# Patient Record
Sex: Male | Born: 1975 | Race: Black or African American | Hispanic: No | Marital: Married | Smoking: Never smoker
Health system: Southern US, Community
[De-identification: ages and names within clinical notes are randomized; demographics above are authoritative.]

## PROBLEM LIST (undated history)

## (undated) DIAGNOSIS — J189 Pneumonia, unspecified organism: Secondary | ICD-10-CM

## (undated) DIAGNOSIS — D649 Anemia, unspecified: Secondary | ICD-10-CM

## (undated) DIAGNOSIS — T7840XA Allergy, unspecified, initial encounter: Secondary | ICD-10-CM

## (undated) DIAGNOSIS — J45909 Unspecified asthma, uncomplicated: Secondary | ICD-10-CM

## (undated) DIAGNOSIS — I10 Essential (primary) hypertension: Secondary | ICD-10-CM

## (undated) DIAGNOSIS — E119 Type 2 diabetes mellitus without complications: Secondary | ICD-10-CM

## (undated) DIAGNOSIS — F32A Depression, unspecified: Secondary | ICD-10-CM

## (undated) DIAGNOSIS — N189 Chronic kidney disease, unspecified: Secondary | ICD-10-CM

## (undated) HISTORY — PX: EYE SURGERY: SHX253

---

## 2007-08-08 ENCOUNTER — Ambulatory Visit: Payer: Self-pay | Admitting: Specialist

## 2018-08-20 DIAGNOSIS — I10 Essential (primary) hypertension: Secondary | ICD-10-CM | POA: Insufficient documentation

## 2018-08-20 DIAGNOSIS — F411 Generalized anxiety disorder: Secondary | ICD-10-CM | POA: Insufficient documentation

## 2018-08-20 DIAGNOSIS — I82401 Acute embolism and thrombosis of unspecified deep veins of right lower extremity: Secondary | ICD-10-CM | POA: Insufficient documentation

## 2018-08-20 DIAGNOSIS — E119 Type 2 diabetes mellitus without complications: Secondary | ICD-10-CM | POA: Insufficient documentation

## 2018-08-20 DIAGNOSIS — E1122 Type 2 diabetes mellitus with diabetic chronic kidney disease: Secondary | ICD-10-CM | POA: Insufficient documentation

## 2018-08-20 DIAGNOSIS — I82413 Acute embolism and thrombosis of femoral vein, bilateral: Secondary | ICD-10-CM | POA: Insufficient documentation

## 2018-11-21 DIAGNOSIS — Z86718 Personal history of other venous thrombosis and embolism: Secondary | ICD-10-CM | POA: Insufficient documentation

## 2018-11-21 DIAGNOSIS — E782 Mixed hyperlipidemia: Secondary | ICD-10-CM | POA: Insufficient documentation

## 2018-11-21 DIAGNOSIS — M545 Low back pain, unspecified: Secondary | ICD-10-CM | POA: Insufficient documentation

## 2020-04-28 ENCOUNTER — Other Ambulatory Visit: Payer: Self-pay

## 2020-04-28 ENCOUNTER — Ambulatory Visit (INDEPENDENT_AMBULATORY_CARE_PROVIDER_SITE_OTHER): Payer: BC Managed Care – PPO | Admitting: Vascular Surgery

## 2020-04-28 ENCOUNTER — Encounter (INDEPENDENT_AMBULATORY_CARE_PROVIDER_SITE_OTHER): Payer: Self-pay | Admitting: Vascular Surgery

## 2020-04-28 VITALS — BP 211/112 | HR 105 | Ht 75.0 in | Wt 233.0 lb

## 2020-04-28 DIAGNOSIS — E782 Mixed hyperlipidemia: Secondary | ICD-10-CM

## 2020-04-28 DIAGNOSIS — I10 Essential (primary) hypertension: Secondary | ICD-10-CM | POA: Diagnosis not present

## 2020-04-28 DIAGNOSIS — E119 Type 2 diabetes mellitus without complications: Secondary | ICD-10-CM

## 2020-04-28 DIAGNOSIS — I82413 Acute embolism and thrombosis of femoral vein, bilateral: Secondary | ICD-10-CM

## 2020-04-28 NOTE — Progress Notes (Signed)
MRN : [id number]  Bradley Lopez is a 45 y.o. ([birth year]) male who presents with chief complaint of  Chief Complaint  Patient presents with  . 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  History of Present Illness:   The patient presents to the office for evaluation of bilateral acute on chronic DVT.  His past history of DVT was identified remotely and the majority of his care has been at [place].  The initial symptoms were pain and swelling in the right lower extremity.  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  He notes that he has been maintained on Eliquis.   Unfortunately approximately 3 weeks ago he ran out of Eliquis and has not had it refilled.  In that interval he has had a tremendous increase in swelling which is now both legs instead of just the previously affected right leg.  He notes that at this point the pain is quite severe and he is having a profound time even trying to get in and out of his car.  The patient has not been using compression therapy at this point because he states that his legs are so swollen he cannot get them on.  No SOB or pleuritic chest pains.  No cough or hemoptysis.  No blood per rectum or blood in any sputum.  No excessive bruising per the patient.   Current Meds  Medication Sig  . Accu-Chek FastClix Lancets MISC by Percutaneous route.  Marland Kitchen apixaban (ELIQUIS) 2.5 MG TABS tablet Take by mouth.  . Cysteamine Bitartrate (PROCYSBI) 300 MG PACK Use 1 each once daily Use as instructed.  Marland Kitchen glucose blood (PRECISION QID TEST) test strip Use 1 each (1 strip total) once daily Use as instructed.  . hydrochlorothiazide (HYDRODIURIL) 25 MG tablet TK 1 T PO ONCE D  . metFORMIN (GLUCOPHAGE) 500 MG tablet Take by mouth.  . [DISCONTINUED] glucose blood (ACCU-CHEK GUIDE) test strip 1 strip by Other route daily.    No past medical history on file.    Social History Social  History   Tobacco Use  . Smoking status: Never Smoker  . Smokeless tobacco: Never Used    Family History No family history of bleeding/clotting disorders, porphyria or autoimmune disease   Allergies  Allergen Reactions  . Pine Hives and Itching     REVIEW OF SYSTEMS (Negative unless checked)  Constitutional: [] Weight loss  [] Fever  [] Chills Cardiac: [] Chest pain   [] Chest pressure   [] Palpitations   [] Shortness of breath when laying flat   [] Shortness of breath with exertion. Vascular:  [x] Pain in legs with walking   [x] Pain in legs at rest  [x] History of DVT   [] Phlebitis   [x] Swelling in legs   [] Varicose veins   [] Non-healing ulcers Pulmonary:   [] Uses home oxygen   [] Productive cough   [] Hemoptysis   [] Wheeze  [] COPD   [] Asthma Neurologic:  [] Dizziness   [] Seizures   [] History of stroke   [] History of TIA  [] Aphasia   [] Vissual changes   [] Weakness or numbness in arm   [] Weakness or numbness in leg Musculoskeletal:   [] Joint swelling   [] Joint pain   [] Low back pain Hematologic:  [] Easy bruising  [] Easy bleeding   [] Hypercoagulable state   [] Anemic Gastrointestinal:  [] Diarrhea   [] Vomiting  [] Gastroesophageal reflux/heartburn   [] Difficulty swallowing. Genitourinary:  [] Chronic kidney disease   [] Difficult urination  [] Frequent urination   []   Blood in urine Skin:  [] Rashes   [] Ulcers  Psychological:  [x] History of anxiety   []  History of major depression.  Physical Examination  Vitals:   04/28/20 1524  BP: (!) 211/112  Pulse: (!) 105  Weight: 233 lb (105.7 kg)  Height: 6\' 3"  (1.905 m)   Body mass index is 29.12 kg/m. Gen: WD/WN, NAD Head: [place]/AT, No temporalis wasting.  Ear/Nose/Throat: Hearing grossly intact, nares w/o erythema or drainage, poor dentition Eyes: PER, EOMI, sclera nonicteric.  Neck: Supple, no masses.  No bruit or JVD.  Pulmonary:  Good air movement, clear to auscultation bilaterally, no use of accessory muscles.  Cardiac: RRR, normal S1, S2, no  Murmurs. Vascular:   Mild venous stasis changes to the legs bilaterally.  4+ massive hard nylon pitting edema bilateral lower extremities Vessel Right Left  Radial Palpable Palpable  Gastrointestinal: soft, non-distended. No guarding/no peritoneal signs.  Musculoskeletal: M/S 5/5 throughout.  No deformity or atrophy.  Neurologic: CN 2-12 intact. Pain and light touch intact in extremities.  Symmetrical.  Speech is fluent. Motor exam as listed above. Psychiatric: Judgment intact, Mood & affect appropriate for pt's clinical situation. Dermatologic: Venous rashes no ulcers noted.  No changes consistent with cellulitis.  CBC No results found for: WBC, HGB, HCT, MCV, PLT  BMET No results found for: NA, K, CL, CO2, GLUCOSE, BUN, CREATININE, CALCIUM, GFRNONAA, GFRAA CrCl cannot be calculated (No successful lab value found.).  COAG No results found for: INR, PROTIME  Radiology No results found.   Assessment/Plan 1. DVT femoral (deep venous thrombosis) with thrombophlebitis, bilateral ([place]) I have had a long discussion with the patient regarding swelling and why it  causes symptoms.  Patient will begin wearing graduated compression stockings class 1 (20-30 mmHg) on a daily basis a prescription was given. The patient will  beginning wearing the stockings or wraps which ever he feels he is able to effectively don, first thing in the morning and removing them in the evening. The patient is instructed specifically not to sleep in the stockings.   In addition, behavioral modification will be initiated.  This will include frequent elevation, use of over the counter pain medications and exercise such as walking.  I will immediately restart his Eliquis and give him 2 weeks from our samples and will represcribe his Eliquis sending it directly to the pharmacy.  Consideration for a lymph pump will also be made based upon the effectiveness of therapy down the road.  This would help to improve the edema  control and prevent sequela such as ulcers and infections   Patient should undergo duplex ultrasound of the bilateral venous system ASAP to ensure that DVT is not present.  Given the magnitude of his debility he would be a candidate for intervention.  The patient will follow-up with me after the ultrasound.   - VAS [place] LOWER EXTREMITY VENOUS (DVT); Future  2. Essential hypertension Continue antihypertensive medications as already ordered, these medications have been reviewed and there are no changes at this time.   3. Type 2 diabetes mellitus without complication, without long-term current use of insulin (HCC) Continue hypoglycemic medications as already ordered, these medications have been reviewed and there are no changes at this time.  Hgb A1C to be monitored as already arranged by primary service   4. Mixed hyperlipidemia Continue statin as ordered and reviewed, no changes at this time     Hortencia Pilar, MD  04/28/2020 3:34 PM

## 2020-04-29 ENCOUNTER — Encounter (INDEPENDENT_AMBULATORY_CARE_PROVIDER_SITE_OTHER): Payer: Self-pay | Admitting: Vascular Surgery

## 2020-04-29 ENCOUNTER — Other Ambulatory Visit (INDEPENDENT_AMBULATORY_CARE_PROVIDER_SITE_OTHER): Payer: Self-pay | Admitting: Vascular Surgery

## 2020-04-29 DIAGNOSIS — M7989 Other specified soft tissue disorders: Secondary | ICD-10-CM

## 2020-04-29 DIAGNOSIS — I82501 Chronic embolism and thrombosis of unspecified deep veins of right lower extremity: Secondary | ICD-10-CM

## 2020-04-29 MED ORDER — APIXABAN 5 MG PO TABS: 5.0000 mg | ORAL_TABLET | Freq: Two times a day (BID) | ORAL | 11 refills | Status: DC

## 2020-05-01 NOTE — Progress Notes (Signed)
MRN : [id number]  Bradley Lopez is a 45 y.o. (1975-11-18) male who presents with chief complaint of No chief complaint on file. Marland Kitchen  History of Present Illness:     The patient presents to the office for evaluation of DVT.  DVT was identified at [facility] remotely.  The initial symptoms were pain and swelling in the right lower extremity.  He was initially seen in this office for a fairly rapid, abrupt onset of bilateral lower extremity swelling which is very painful.  The swelling extends up to his thighs even to the level of the groins.  It seemed to coincide with his running out of Eliquis and therefore not being on any anticoagulation therapy in light of his previous DVT.  The patient notes today that his legs continue to be very painful and remain exceedingly swollen.  Symptoms are slightly better with elevation.  The patient notes he has gained 5 pounds just since his last visit.  He is no longer able to wear regular pants because his thighs are so enlarged and is now wearing sweatpants  He was started back on Eliquis when he saw me last week and has been continuing this therapy.  No SOB or pleuritic chest pains.  No cough or hemoptysis.  No blood per rectum or blood in any sputum.  No excessive bruising per the patient.  Duplex ultrasound obtained today demonstrates chronic changes in the right popliteal area consistent with previous findings.  There does not appear to be any acute or subacute thrombus noted in the proximal femoral or iliac veins.  On the left there is no evidence of DVT.  Incidental finding of multiple enlarged lymph nodes bilateral groin areas.    No outpatient medications have been marked as taking for the 05/02/20 encounter (Appointment) with Delana Meyer, Dolores Lory, MD.    No past medical history on file. No family history of bleeding/clotting disorders, porphyria or autoimmune disease  No past surgical history on file.  Social History Social History   Tobacco Use  .  Smoking status: Never Smoker  . Smokeless tobacco: Never Used    Family History No family history on file.  Allergies  Allergen Reactions  . Pine Hives and Itching     REVIEW OF SYSTEMS (Negative unless checked)  Constitutional: [] Weight loss  [] Fever  [] Chills Cardiac: [] Chest pain   [] Chest pressure   [] Palpitations   [] Shortness of breath when laying flat   [] Shortness of breath with exertion. Vascular:  [] Pain in legs with walking   [x] Pain in legs at rest  [x] History of DVT   [] Phlebitis   [x] Swelling in legs   [] Varicose veins   [] Non-healing ulcers Pulmonary:   [] Uses home oxygen   [] Productive cough   [] Hemoptysis   [] Wheeze  [] COPD   [] Asthma Neurologic:  [] Dizziness   [] Seizures   [] History of stroke   [] History of TIA  [] Aphasia   [] Vissual changes   [] Weakness or numbness in arm   [] Weakness or numbness in leg Musculoskeletal:   [] Joint swelling   [] Joint pain   [] Low back pain Hematologic:  [] Easy bruising  [] Easy bleeding   [] Hypercoagulable state   [] Anemic Gastrointestinal:  [] Diarrhea   [] Vomiting  [] Gastroesophageal reflux/heartburn   [] Difficulty swallowing. Genitourinary:  [] Chronic kidney disease   [] Difficult urination  [] Frequent urination   [] Blood in urine Skin:  [] Rashes   [] Ulcers  Psychological:  [] History of anxiety   []  History of major depression.  Physical Examination  There were  no vitals filed for this visit. There is no height or weight on file to calculate BMI. Gen: WD/WN, NAD Head: /AT, No temporalis wasting.  Ear/Nose/Throat: Hearing grossly intact, nares w/o erythema or drainage Eyes: PER, EOMI, sclera nonicteric.  Neck: Supple, no large masses.   Pulmonary:  Good air movement, no audible wheezing bilaterally, no use of accessory muscles.  Cardiac: RRR, no JVD Vascular: Both legs are massively swollen up to the level of the groin the thighs are very firm the edema is minimally pitting Vessel Right Left  Radial Palpable Palpable   Gastrointestinal: Non-distended. No guarding/no peritoneal signs.  Musculoskeletal: M/S 5/5 throughout.  No deformity or atrophy.  Neurologic: CN 2-12 intact. Symmetrical.  Speech is fluent. Motor exam as listed above. Psychiatric: Judgment intact, Mood & affect appropriate for pt's clinical situation. Dermatologic: No rashes or ulcers noted.  No changes consistent with cellulitis.   CBC No results found for: WBC, HGB, HCT, MCV, PLT  BMET No results found for: NA, K, CL, CO2, GLUCOSE, BUN, CREATININE, CALCIUM, GFRNONAA, GFRAA CrCl cannot be calculated (No successful lab value found.).  COAG No results found for: INR, PROTIME  Radiology No results found.   Assessment/Plan 1. Pain and swelling of lower leg, unspecified laterality Given the findings of the duplex ultrasound my original preliminary diagnosis of iliofemoral DVT does not appear to be accurate.  There is still a possibility of a more proximal venous obstruction.  Given the finding of bilateral significant lymphadenopathy in addition to the above noted potential I will order a CT abdomen and pelvis with contrast to more thoroughly evaluate the venous system as well as better assess the lymphadenopathy.  In the meantime he will continue his Eliquis. - CT ANGIO ABDOMEN PELVIS  W &/OR WO CONTRAST; Future  2. History of DVT of lower extremity Continue Eliquis for now.  3. Type 2 diabetes mellitus without complication, without long-term current use of insulin ([place]) He has recently moved from the [facility] area back to [place].  I will help him establish with a primary care physician. - Ambulatory referral to Internal Medicine  4. Mixed hyperlipidemia See #3 - Ambulatory referral to Internal Medicine    Hortencia Pilar, MD  05/01/2020 8:34 PM

## 2020-05-02 ENCOUNTER — Ambulatory Visit (INDEPENDENT_AMBULATORY_CARE_PROVIDER_SITE_OTHER): Payer: BC Managed Care – PPO | Admitting: Vascular Surgery

## 2020-05-02 ENCOUNTER — Other Ambulatory Visit: Payer: Self-pay

## 2020-05-02 ENCOUNTER — Encounter (INDEPENDENT_AMBULATORY_CARE_PROVIDER_SITE_OTHER): Payer: Self-pay | Admitting: Vascular Surgery

## 2020-05-02 ENCOUNTER — Ambulatory Visit (INDEPENDENT_AMBULATORY_CARE_PROVIDER_SITE_OTHER): Payer: BC Managed Care – PPO

## 2020-05-02 VITALS — BP 194/106 | HR 98 | Resp 16 | Wt 238.6 lb

## 2020-05-02 DIAGNOSIS — E1169 Type 2 diabetes mellitus with other specified complication: Secondary | ICD-10-CM

## 2020-05-02 DIAGNOSIS — M7989 Other specified soft tissue disorders: Secondary | ICD-10-CM

## 2020-05-02 DIAGNOSIS — Z86718 Personal history of other venous thrombosis and embolism: Secondary | ICD-10-CM

## 2020-05-02 DIAGNOSIS — M79669 Pain in unspecified lower leg: Secondary | ICD-10-CM

## 2020-05-02 DIAGNOSIS — M79661 Pain in right lower leg: Secondary | ICD-10-CM | POA: Diagnosis not present

## 2020-05-02 DIAGNOSIS — E782 Mixed hyperlipidemia: Secondary | ICD-10-CM

## 2020-05-02 DIAGNOSIS — E119 Type 2 diabetes mellitus without complications: Secondary | ICD-10-CM | POA: Diagnosis not present

## 2020-05-02 DIAGNOSIS — M79662 Pain in left lower leg: Secondary | ICD-10-CM

## 2020-05-02 DIAGNOSIS — I82413 Acute embolism and thrombosis of femoral vein, bilateral: Secondary | ICD-10-CM | POA: Diagnosis not present

## 2020-05-02 DIAGNOSIS — I713 Abdominal aortic aneurysm, ruptured, unspecified: Secondary | ICD-10-CM

## 2020-05-04 ENCOUNTER — Ambulatory Visit: Admission: RE | Admit: 2020-05-04 | Payer: BC Managed Care – PPO | Source: Ambulatory Visit

## 2020-05-09 ENCOUNTER — Other Ambulatory Visit (INDEPENDENT_AMBULATORY_CARE_PROVIDER_SITE_OTHER): Payer: Self-pay | Admitting: Vascular Surgery

## 2020-05-09 ENCOUNTER — Other Ambulatory Visit: Payer: Self-pay

## 2020-05-09 ENCOUNTER — Ambulatory Visit
Admission: RE | Admit: 2020-05-09 | Discharge: 2020-05-09 | Disposition: A | Discharge status: Home / Self Care | Payer: BC Managed Care – PPO | Source: Ambulatory Visit | Attending: Vascular Surgery | Admitting: Vascular Surgery

## 2020-05-09 DIAGNOSIS — I713 Abdominal aortic aneurysm, ruptured, unspecified: Secondary | ICD-10-CM

## 2020-05-09 HISTORY — DX: Unspecified asthma, uncomplicated: J45.909

## 2020-05-09 HISTORY — DX: Essential (primary) hypertension: I10

## 2020-05-09 HISTORY — DX: Type 2 diabetes mellitus without complications: E11.9

## 2020-05-09 LAB — POCT I-STAT CREATININE: Creatinine, Ser: 1.9 mg/dL — ABNORMAL HIGH (ref 0.61–1.24)

## 2020-05-09 MED ORDER — IOHEXOL 350 MG/ML SOLN
75.0000 mL | Freq: Once | INTRAVENOUS | Status: AC | PRN
  Administered 2020-05-09: 75 mL via INTRAVENOUS

## 2020-05-12 ENCOUNTER — Ambulatory Visit (INDEPENDENT_AMBULATORY_CARE_PROVIDER_SITE_OTHER): Payer: BC Managed Care – PPO | Admitting: Vascular Surgery

## 2020-05-12 ENCOUNTER — Encounter (INDEPENDENT_AMBULATORY_CARE_PROVIDER_SITE_OTHER): Payer: Self-pay | Admitting: Vascular Surgery

## 2020-05-12 ENCOUNTER — Other Ambulatory Visit: Payer: Self-pay

## 2020-05-12 VITALS — BP 193/124 | HR 94 | Ht 75.0 in | Wt 225.0 lb

## 2020-05-12 DIAGNOSIS — I10 Essential (primary) hypertension: Secondary | ICD-10-CM

## 2020-05-12 DIAGNOSIS — I82413 Acute embolism and thrombosis of femoral vein, bilateral: Secondary | ICD-10-CM | POA: Diagnosis not present

## 2020-05-12 DIAGNOSIS — I890 Lymphedema, not elsewhere classified: Secondary | ICD-10-CM | POA: Diagnosis not present

## 2020-05-12 DIAGNOSIS — E119 Type 2 diabetes mellitus without complications: Secondary | ICD-10-CM

## 2020-05-12 DIAGNOSIS — E782 Mixed hyperlipidemia: Secondary | ICD-10-CM

## 2020-05-17 DIAGNOSIS — J45909 Unspecified asthma, uncomplicated: Secondary | ICD-10-CM | POA: Insufficient documentation

## 2020-05-18 ENCOUNTER — Other Ambulatory Visit: Payer: Self-pay | Admitting: Internal Medicine

## 2020-05-18 DIAGNOSIS — R748 Abnormal levels of other serum enzymes: Secondary | ICD-10-CM

## 2020-05-19 ENCOUNTER — Telehealth (INDEPENDENT_AMBULATORY_CARE_PROVIDER_SITE_OTHER): Payer: Self-pay | Admitting: Vascular Surgery

## 2020-05-19 ENCOUNTER — Encounter (INDEPENDENT_AMBULATORY_CARE_PROVIDER_SITE_OTHER): Payer: Self-pay

## 2020-05-19 ENCOUNTER — Other Ambulatory Visit (INDEPENDENT_AMBULATORY_CARE_PROVIDER_SITE_OTHER): Payer: Self-pay | Admitting: Nurse Practitioner

## 2020-05-19 NOTE — Telephone Encounter (Signed)
Patient needs RX Eliquis. Patient was last seen 05/12/20 with GS.

## 2020-05-19 NOTE — Telephone Encounter (Signed)
RX sent 04/29/20

## 2020-05-20 NOTE — Telephone Encounter (Signed)
Called patient 05/19/20 to make him aware. Patient stated he never called the pharmacy to check on the RX.

## 2020-05-23 ENCOUNTER — Other Ambulatory Visit (INDEPENDENT_AMBULATORY_CARE_PROVIDER_SITE_OTHER): Payer: Self-pay | Admitting: Nurse Practitioner

## 2020-05-23 NOTE — Telephone Encounter (Signed)
Patient prescription for Eliquis 5 mg has been called into [facility] per patient request and I have cancel rx that was sent to [facility]. Pharmacy has being updated in patient chart and patient has been made aware.

## 2020-05-24 ENCOUNTER — Other Ambulatory Visit (INDEPENDENT_AMBULATORY_CARE_PROVIDER_SITE_OTHER): Payer: Self-pay | Admitting: Nurse Practitioner

## 2020-05-24 MED ORDER — RIVAROXABAN 20 MG PO TABS: 20.0000 mg | ORAL_TABLET | Freq: Every day | ORAL | 11 refills | Status: DC

## 2020-05-26 ENCOUNTER — Other Ambulatory Visit: Payer: Self-pay

## 2020-05-26 ENCOUNTER — Ambulatory Visit
Admission: RE | Admit: 2020-05-26 | Discharge: 2020-05-26 | Disposition: A | Discharge status: Home / Self Care | Payer: BC Managed Care – PPO | Source: Ambulatory Visit | Attending: Internal Medicine | Admitting: Internal Medicine

## 2020-05-26 DIAGNOSIS — R748 Abnormal levels of other serum enzymes: Secondary | ICD-10-CM | POA: Diagnosis present

## 2020-05-28 ENCOUNTER — Encounter (INDEPENDENT_AMBULATORY_CARE_PROVIDER_SITE_OTHER): Payer: Self-pay | Admitting: Vascular Surgery

## 2020-05-28 DIAGNOSIS — I890 Lymphedema, not elsewhere classified: Secondary | ICD-10-CM | POA: Insufficient documentation

## 2020-05-28 NOTE — Progress Notes (Signed)
MRN : [id number]  Bradley Lopez is a 45 y.o. (Jul 17, 1975) male who presents with chief complaint of  Chief Complaint  Patient presents with  . Follow-up    CT resuts  .  History of Present Illness:   The patient returns to the office for followup evaluation regarding leg swelling.  The swelling has persisted and the pain associated with swelling continues. There have not been any interval development of a ulcerations or wounds.  Since the previous visit the patient has been wearing graduated compression stockings and has noted some improvement in the lymphedema. The patient has been using compression routinely morning until night.  He has also followed up with Dr Edwina Barth and has been taking his diuretic with some improvement.  The patient also states elevation during the day and exercise is being done too.   CT scan is negative fro diffuse lymphadenopathy not consistent with malignancy groin nodes appear more reactive.  Current Meds  Medication Sig  . Accu-Chek FastClix Lancets MISC by Percutaneous route.  . Cysteamine Bitartrate (PROCYSBI) 300 MG PACK Use 1 each once daily Use as instructed.  Marland Kitchen glucose blood (PRECISION QID TEST) test strip Use 1 each (1 strip total) once daily Use as instructed.  . hydrochlorothiazide (HYDRODIURIL) 25 MG tablet TK 1 T PO ONCE D  . metFORMIN (GLUCOPHAGE) 500 MG tablet Take by mouth.  . [DISCONTINUED] apixaban (ELIQUIS) 5 MG TABS tablet Take 1 tablet (5 mg total) by mouth 2 (two) times daily.    Past Medical History:  Diagnosis Date  . Asthma   . Diabetes mellitus without complication ([place])   . Hypertension     No past surgical history on file.  Social History Social History   Tobacco Use  . Smoking status: Never Smoker  . Smokeless tobacco: Never Used    Family History No family history on file.  Allergies  Allergen Reactions  . Pine Hives and Itching     REVIEW OF SYSTEMS (Negative unless checked)  Constitutional:  '[]'$ Weight loss  '[]'$ Fever  '[]'$ Chills Cardiac: '[]'$ Chest pain   '[]'$ Chest pressure   '[]'$ Palpitations   '[]'$ Shortness of breath when laying flat   '[]'$ Shortness of breath with exertion. Vascular:  '[]'$ Pain in legs with walking   '[x]'$ Pain in legs at rest  '[]'$ History of DVT   '[]'$ Phlebitis   '[x]'$ Swelling in legs   '[]'$ Varicose veins   '[]'$ Non-healing ulcers Pulmonary:   '[]'$ Uses home oxygen   '[]'$ Productive cough   '[]'$ Hemoptysis   '[]'$ Wheeze  '[]'$ COPD   '[]'$ Asthma Neurologic:  '[]'$ Dizziness   '[]'$ Seizures   '[]'$ History of stroke   '[]'$ History of TIA  '[]'$ Aphasia   '[]'$ Vissual changes   '[]'$ Weakness or numbness in arm   '[]'$ Weakness or numbness in leg Musculoskeletal:   '[]'$ Joint swelling   '[x]'$ Joint pain   '[]'$ Low back pain Hematologic:  '[]'$ Easy bruising  '[]'$ Easy bleeding   '[]'$ Hypercoagulable state   '[]'$ Anemic Gastrointestinal:  '[]'$ Diarrhea   '[]'$ Vomiting  '[]'$ Gastroesophageal reflux/heartburn   '[]'$ Difficulty swallowing. Genitourinary:  '[]'$ Chronic kidney disease   '[]'$ Difficult urination  '[]'$ Frequent urination   '[]'$ Blood in urine Skin:  '[]'$ Rashes   '[]'$ Ulcers  Psychological:  '[x]'$ History of anxiety   '[]'$  History of major depression.  Physical Examination  Vitals:   05/12/20 1624  BP: (!) 193/124  Pulse: 94  Weight: 225 lb (102.1 kg)  Height: '6\' 3"'$  (1.905 m)   Body mass index is 28.12 kg/m. Gen: WD/WN, NAD Head: [place]/AT, No temporalis wasting.  Ear/Nose/Throat: Hearing grossly intact, nares w/o erythema or drainage Eyes:  PER, EOMI, sclera nonicteric.  Neck: Supple, no large masses.   Pulmonary:  Good air movement, no audible wheezing bilaterally, no use of accessory muscles.  Cardiac: RRR, no JVD Vascular: scattered varicosities present bilaterally.  Moderate venous stasis changes to the legs bilaterally.  2-3+ soft pitting edema. Vessel Right Left  Radial Palpable Palpable  Gastrointestinal: Non-distended. No guarding/no peritoneal signs.  Musculoskeletal: M/S 5/5 throughout.  No deformity or atrophy.  Neurologic: CN 2-12 intact. Symmetrical.  Speech is fluent.  Motor exam as listed above. Psychiatric: Judgment intact, Mood & affect appropriate for pt's clinical situation. Dermatologic: Venous rashes no ulcers noted.  No changes consistent with cellulitis. Lymph : + lichenification / skin changes of chronic lymphedema.  CBC No results found for: WBC, HGB, HCT, MCV, PLT  BMET    Component Value Date/Time   CREATININE 1.90 (H) 05/09/2020 1102   Estimated Creatinine Clearance: 64.9 mL/min (A) (by C-G formula based on SCr of 1.9 mg/dL (H)).  COAG No results found for: INR, PROTIME  Radiology CT VENOGRAM ABD/PEL  Result Date: 05/09/2020 CLINICAL DATA:  Severe lymphedema of both lower extremities and history of prior right lower extremity DVT. Duplex ultrasound demonstrated prominent bilateral inguinal lymph nodes. EXAM: CT VENOGRAM ABD-PELVIS TECHNIQUE: Multidetector CT imaging of the abdomen and pelvis was performed using the standard protocol during bolus administration of intravenous contrast. Multiplanar reconstructed images and MIPs were obtained and reviewed to evaluate the vascular anatomy. CONTRAST:  55m OMNIPAQUE IOHEXOL 350 MG/ML SOLN COMPARISON:  Report from a bilateral lower extremity venous duplex ultrasound on 05/02/2020. FINDINGS: VASCULAR Veins: There is no evidence of thrombus in the IVC, iliac veins or common femoral veins. No mass effect on venous structures is identified and there is no evidence of visible venous stenosis. Evaluation of other venous structures in the abdomen and pelvis is somewhat limited due to the timing of the study and therefore evaluation of the portal vein and mesenteric veins is limited. Bilateral renal veins appear to be normally patent. Review of the MIP images confirms the above findings. NON-VASCULAR Lower chest: There are small bilateral pleural effusions. No visible parenchymal consolidation or pulmonary edema. Mild bibasilar pulmonary scarring. Hepatobiliary: No focal liver abnormality is seen. No  gallstones, gallbladder wall thickening, or biliary dilatation. Pancreas: Unremarkable. No pancreatic ductal dilatation or surrounding inflammatory changes. Spleen: Normal in size without focal abnormality. Adrenals/Urinary Tract: No hydronephrosis, focal renal lesions or calculi. The bladder is relatively decompressed. Stomach/Bowel: Bowel shows no evidence of obstruction, ileus or inflammation. No free air identified. Lymphatic: Mildly prominent bilateral inguinal lymph nodes. The largest on the right measures 13 mm in short axis and the largest on the left 14 mm. These may be reactive based on size and location. No evidence of enlarged abdominal or intrapelvic lymph nodes. Reproductive: Prostate is unremarkable. Calcifications in the seminal vesicles and vas deferens bilaterally likely relate to underlying diabetes. Other: No ascites or abscess. There is diffuse anasarca of the body wall which may reflect hyperproteinemia, renal dysfunction and/or cardiac dysfunction. Correlation suggested. No focal hernias. Musculoskeletal: No acute or significant osseous findings. IMPRESSION: 1. No evidence of thrombus in the IVC, iliac veins or common femoral veins. No mass effect on venous structures is identified and there is no evidence of visible venous stenosis. 2. Small bilateral pleural effusions. 3. Mildly prominent bilateral inguinal lymph nodes which may be reactive based on size and location. 4. Diffuse anasarca of the body wall which may reflect hyperproteinemia, renal dysfunction and/or cardiac dysfunction. Correlation  suggested. Electronically Signed   By: Aletta Edouard M.D.   On: 05/09/2020 13:00   VAS [place] LOWER EXTREMITY VENOUS (DVT)  Result Date: 05/05/2020  Lower Venous DVT Study Indications: Edema.  Limitations: Bilateral edema throughout lower extremities.  Comparison Study: Right leg Venous US on 08/24/19 at [facility]: "This right lower                   extremity venous duplex exam demonstrated chronic                    post-thrombotic changes in the mid to distal femoral and                   popliteal(s), gastrocnemius veins without significant deep                   venous reflux appreciated. Minimal change compared to the                   study of 02/11/19." Performing Technologist: Blondell Reveal RT, RDMS, RVT  Examination Guidelines: A complete evaluation includes B-mode imaging, spectral Doppler, color Doppler, and power Doppler as needed of all accessible portions of each vessel. Bilateral testing is considered an integral part of a complete examination. Limited examinations for reoccurring indications may be performed as noted. The reflux portion of the exam is performed with the patient in reverse Trendelenburg.  +---------+---------------+---------+-----------+---------------+--------------+ RIGHT    CompressibilityPhasicitySpontaneityProperties     Thrombus Aging +---------+---------------+---------+-----------+---------------+--------------+ CFV      Full           Yes      Yes                                      +---------+---------------+---------+-----------+---------------+--------------+ SFJ      Full                                                             +---------+---------------+---------+-----------+---------------+--------------+ FV Prox  Full                                                             +---------+---------------+---------+-----------+---------------+--------------+ FV Mid   Partial        Yes      Yes        softly         Chronic                                                    echogenic                     +---------+---------------+---------+-----------+---------------+--------------+ FV DistalPartial        Yes      Yes        softly         Chronic  echogenic                     +---------+---------------+---------+-----------+---------------+--------------+  POP      Partial        Yes      Yes        softly         Chronic                                                    echogenic                     +---------+---------------+---------+-----------+---------------+--------------+ PTV      Full                                                             +---------+---------------+---------+-----------+---------------+--------------+ PERO     Full                                                             +---------+---------------+---------+-----------+---------------+--------------+ Gastroc  Full                                                             +---------+---------------+---------+-----------+---------------+--------------+ GSV      Full           Yes      Yes                                      +---------+---------------+---------+-----------+---------------+--------------+ SSV      Full           Yes      Yes                                      +---------+---------------+---------+-----------+---------------+--------------+  +---------+---------------+---------+-----------+----------+--------------+ LEFT     CompressibilityPhasicitySpontaneityPropertiesThrombus Aging +---------+---------------+---------+-----------+----------+--------------+ CFV      Full           Yes      Yes                                 +---------+---------------+---------+-----------+----------+--------------+ SFJ      Full                                                        +---------+---------------+---------+-----------+----------+--------------+ FV Prox  Full                                                        +---------+---------------+---------+-----------+----------+--------------+  FV Mid   Full           Yes      Yes                                 +---------+---------------+---------+-----------+----------+--------------+ FV DistalFull                                                         +---------+---------------+---------+-----------+----------+--------------+ POP      Full           Yes      Yes                                 +---------+---------------+---------+-----------+----------+--------------+ PTV      Full                                                        +---------+---------------+---------+-----------+----------+--------------+ PERO     Full                                                        +---------+---------------+---------+-----------+----------+--------------+ Gastroc  Full                                                        +---------+---------------+---------+-----------+----------+--------------+ GSV      Full           Yes      Yes                                 +---------+---------------+---------+-----------+----------+--------------+ SSV      Full                                                        +---------+---------------+---------+-----------+----------+--------------+  Limited visualization of the bilateral mid-distal femoral and calf veins due to excessive bilateral edema.  Summary: RIGHT: - Findings consistent with chronic deep vein thrombosis involving the right femoral vein, and right popliteal vein. - Findings appear essentially unchanged compared to previous examination. Multiple enlarged lymph nodes noted in the groin area.  LEFT: - No evidence of deep vein thrombosis in the lower extremity. No indirect evidence of obstruction proximal to the inguinal ligament. Multiple enlarged lymph nodes noted in the groin area.  *See table(s) above for measurements and observations. Electronically signed by Hortencia Pilar MD on 05/05/2020 at 1:23:10 PM.    Final    US Abdomen Limited RUQ (LIVER/GB)  Result Date: 05/26/2020 CLINICAL DATA:  Elevated  liver enzymes EXAM: ULTRASOUND ABDOMEN LIMITED RIGHT UPPER QUADRANT COMPARISON:  CT venogram abdomen pelvis 05/09/2020 FINDINGS: Gallbladder: Minimal  sludge. No gallbladder thickening or pericholecystic fluid. No sonographic Murphy sign noted by sonographer. Common bile duct: Diameter: 5 mm Liver: No focal lesion identified. Within normal limits in parenchymal echogenicity. Portal vein is patent on color Doppler imaging with normal direction of blood flow towards the liver. Other: None. IMPRESSION: 1. Normal sonographic appearance of the liver. 2. Minimal gallbladder sludge without additional sonographic evidence of acute cholecystitis. 3. Incidental note made of a right pleural effusion. Electronically Signed   By: Miachel Roux M.D.   On: 05/26/2020 10:40     Assessment/Plan 1. Lymphedema Recommend:  No surgery or intervention at this point in time.    I have reviewed my previous discussion with the patient regarding swelling and why it causes symptoms.  Patient will continue wearing graduated compression stockings class 1 (20-30 mmHg) on a daily basis. The patient will  beginning wearing the stockings first thing in the morning and removing them in the evening. The patient is instructed specifically not to sleep in the stockings.    In addition, behavioral modification including several periods of elevation of the lower extremities during the day will be continued.  This was reviewed with the patient during the initial visit.  The patient will also continue routine exercise, especially walking on a daily basis as was discussed during the initial visit.    Despite conservative treatments including graduated compression therapy class 1 and behavioral modification including exercise and elevation the patient  has not obtained adequate control of the lymphedema.  The patient still has stage 3 lymphedema and therefore, I believe that a lymph pump should be added to improve the control of the patient's lymphedema.  Additionally, a lymph pump is warranted because it will reduce the risk of cellulitis and ulceration in the future.  Patient should  follow-up in six months    2. DVT femoral (deep venous thrombosis) with thrombophlebitis, bilateral (HCC) Recommend:  No surgery or intervention at this point in time.    I have reviewed my previous discussion with the patient regarding swelling and why it causes symptoms.  Patient will continue wearing graduated compression stockings class 1 (20-30 mmHg) on a daily basis. The patient will  beginning wearing the stockings first thing in the morning and removing them in the evening. The patient is instructed specifically not to sleep in the stockings.    In addition, behavioral modification including several periods of elevation of the lower extremities during the day will be continued.  This was reviewed with the patient during the initial visit.  The patient will also continue routine exercise, especially walking on a daily basis as was discussed during the initial visit.    Despite conservative treatments including graduated compression therapy class 1 and behavioral modification including exercise and elevation the patient  has not obtained adequate control of the lymphedema.  The patient still has stage 3 lymphedema and therefore, I believe that a lymph pump should be added to improve the control of the patient's lymphedema.  Additionally, a lymph pump is warranted because it will reduce the risk of cellulitis and ulceration in the future.  Patient should follow-up in six months    3. Essential hypertension Continue antihypertensive medications as already ordered, these medications have been reviewed and there are no changes at this time.   4. Type 2 diabetes mellitus without complication, without long-term current use of  insulin ([place]) Continue hypoglycemic medications as already ordered, these medications have been reviewed and there are no changes at this time.  Hgb A1C to be monitored as already arranged by primary service   5. Mixed hyperlipidemia Continue statin as ordered and  reviewed, no changes at this time     Hortencia Pilar, MD  05/28/2020 12:17 PM

## 2020-06-01 ENCOUNTER — Telehealth (INDEPENDENT_AMBULATORY_CARE_PROVIDER_SITE_OTHER): Payer: Self-pay

## 2020-06-01 NOTE — Telephone Encounter (Signed)
Lvm for patient to callback to be scheduled

## 2020-06-01 NOTE — Telephone Encounter (Signed)
Pt has already had ct results given to him. Ct done 05/09/20 ct results done 05/12/20 with GS

## 2020-06-01 NOTE — Telephone Encounter (Signed)
Pt needs a Follow up on his recent CT angio.

## 2020-06-06 ENCOUNTER — Ambulatory Visit (INDEPENDENT_AMBULATORY_CARE_PROVIDER_SITE_OTHER): Payer: BC Managed Care – PPO | Admitting: Vascular Surgery

## 2020-06-10 ENCOUNTER — Emergency Department: Payer: BC Managed Care – PPO

## 2020-06-10 ENCOUNTER — Inpatient Hospital Stay
Admission: EM | Admit: 2020-06-10 | Discharge: 2020-06-13 | DRG: 305 | Disposition: A | Discharge status: Home / Self Care | Payer: BC Managed Care – PPO | Attending: Internal Medicine | Admitting: Internal Medicine

## 2020-06-10 ENCOUNTER — Other Ambulatory Visit: Payer: Self-pay

## 2020-06-10 DIAGNOSIS — D631 Anemia in chronic kidney disease: Secondary | ICD-10-CM | POA: Diagnosis present

## 2020-06-10 DIAGNOSIS — E1165 Type 2 diabetes mellitus with hyperglycemia: Secondary | ICD-10-CM | POA: Diagnosis present

## 2020-06-10 DIAGNOSIS — Z86718 Personal history of other venous thrombosis and embolism: Secondary | ICD-10-CM

## 2020-06-10 DIAGNOSIS — I161 Hypertensive emergency: Principal | ICD-10-CM | POA: Diagnosis present

## 2020-06-10 DIAGNOSIS — N183 Chronic kidney disease, stage 3 unspecified: Secondary | ICD-10-CM | POA: Diagnosis present

## 2020-06-10 DIAGNOSIS — Z9119 Patient's noncompliance with other medical treatment and regimen: Secondary | ICD-10-CM

## 2020-06-10 DIAGNOSIS — I674 Hypertensive encephalopathy: Secondary | ICD-10-CM | POA: Diagnosis present

## 2020-06-10 DIAGNOSIS — E782 Mixed hyperlipidemia: Secondary | ICD-10-CM | POA: Diagnosis not present

## 2020-06-10 DIAGNOSIS — N5089 Other specified disorders of the male genital organs: Secondary | ICD-10-CM | POA: Diagnosis present

## 2020-06-10 DIAGNOSIS — E876 Hypokalemia: Secondary | ICD-10-CM | POA: Diagnosis present

## 2020-06-10 DIAGNOSIS — E1122 Type 2 diabetes mellitus with diabetic chronic kidney disease: Secondary | ICD-10-CM | POA: Diagnosis present

## 2020-06-10 DIAGNOSIS — Z7984 Long term (current) use of oral hypoglycemic drugs: Secondary | ICD-10-CM

## 2020-06-10 DIAGNOSIS — R601 Generalized edema: Secondary | ICD-10-CM | POA: Diagnosis present

## 2020-06-10 DIAGNOSIS — Z7901 Long term (current) use of anticoagulants: Secondary | ICD-10-CM

## 2020-06-10 DIAGNOSIS — H538 Other visual disturbances: Secondary | ICD-10-CM

## 2020-06-10 DIAGNOSIS — I129 Hypertensive chronic kidney disease with stage 1 through stage 4 chronic kidney disease, or unspecified chronic kidney disease: Secondary | ICD-10-CM | POA: Diagnosis present

## 2020-06-10 DIAGNOSIS — N049 Nephrotic syndrome with unspecified morphologic changes: Secondary | ICD-10-CM | POA: Diagnosis present

## 2020-06-10 DIAGNOSIS — R112 Nausea with vomiting, unspecified: Secondary | ICD-10-CM | POA: Diagnosis present

## 2020-06-10 DIAGNOSIS — E11319 Type 2 diabetes mellitus with unspecified diabetic retinopathy without macular edema: Secondary | ICD-10-CM | POA: Diagnosis present

## 2020-06-10 DIAGNOSIS — R778 Other specified abnormalities of plasma proteins: Secondary | ICD-10-CM | POA: Diagnosis present

## 2020-06-10 DIAGNOSIS — N50819 Testicular pain, unspecified: Secondary | ICD-10-CM

## 2020-06-10 DIAGNOSIS — E119 Type 2 diabetes mellitus without complications: Secondary | ICD-10-CM

## 2020-06-10 DIAGNOSIS — Z20822 Contact with and (suspected) exposure to covid-19: Secondary | ICD-10-CM | POA: Diagnosis present

## 2020-06-10 DIAGNOSIS — N1831 Chronic kidney disease, stage 3a: Secondary | ICD-10-CM | POA: Diagnosis present

## 2020-06-10 DIAGNOSIS — Z79899 Other long term (current) drug therapy: Secondary | ICD-10-CM

## 2020-06-10 DIAGNOSIS — I169 Hypertensive crisis, unspecified: Secondary | ICD-10-CM | POA: Diagnosis present

## 2020-06-10 DIAGNOSIS — N2581 Secondary hyperparathyroidism of renal origin: Secondary | ICD-10-CM | POA: Diagnosis present

## 2020-06-10 LAB — CBC WITH DIFFERENTIAL/PLATELET
Abs Immature Granulocytes: 0.03 10*3/uL (ref 0.00–0.07)
Basophils Absolute: 0.1 10*3/uL (ref 0.0–0.1)
Basophils Relative: 1 %
Eosinophils Absolute: 0.4 10*3/uL (ref 0.0–0.5)
Eosinophils Relative: 4 %
HCT: 30.9 % — ABNORMAL LOW (ref 39.0–52.0)
Hemoglobin: 10.2 g/dL — ABNORMAL LOW (ref 13.0–17.0)
Immature Granulocytes: 0 %
Lymphocytes Relative: 15 %
Lymphs Abs: 1.6 10*3/uL (ref 0.7–4.0)
MCH: 28.4 pg (ref 26.0–34.0)
MCHC: 33 g/dL (ref 30.0–36.0)
MCV: 86.1 fL (ref 80.0–100.0)
Monocytes Absolute: 0.8 10*3/uL (ref 0.1–1.0)
Monocytes Relative: 7 %
Neutro Abs: 7.8 10*3/uL — ABNORMAL HIGH (ref 1.7–7.7)
Neutrophils Relative %: 73 %
Platelets: 446 10*3/uL — ABNORMAL HIGH (ref 150–400)
RBC: 3.59 MIL/uL — ABNORMAL LOW (ref 4.22–5.81)
RDW: 13.4 % (ref 11.5–15.5)
WBC: 10.7 10*3/uL — ABNORMAL HIGH (ref 4.0–10.5)
nRBC: 0 % (ref 0.0–0.2)

## 2020-06-10 LAB — URINALYSIS, COMPLETE (UACMP) WITH MICROSCOPIC
Bilirubin Urine: NEGATIVE
Glucose, UA: 50 mg/dL — AB
Ketones, ur: NEGATIVE mg/dL
Nitrite: NEGATIVE
Protein, ur: >=300 mg/dL — AB
RBC / HPF: >50 RBC/hpf — ABNORMAL HIGH (ref 0–5)
Specific Gravity, Urine: 1.01 (ref 1.005–1.030)
WBC, UA: >50 WBC/hpf — ABNORMAL HIGH (ref 0–5)
pH: 7 (ref 5.0–8.0)

## 2020-06-10 LAB — COMPREHENSIVE METABOLIC PANEL
ALT: 30 U/L (ref 0–44)
AST: 48 U/L — ABNORMAL HIGH (ref 15–41)
Albumin: 2.3 g/dL — ABNORMAL LOW (ref 3.5–5.0)
Alkaline Phosphatase: 332 U/L — ABNORMAL HIGH (ref 38–126)
Anion gap: 8 (ref 5–15)
BUN: 19 mg/dL (ref 6–20)
CO2: 28 mmol/L (ref 22–32)
Calcium: 8.6 mg/dL — ABNORMAL LOW (ref 8.9–10.3)
Chloride: 105 mmol/L (ref 98–111)
Creatinine, Ser: 1.63 mg/dL — ABNORMAL HIGH (ref 0.61–1.24)
GFR, Estimated: 53 mL/min — ABNORMAL LOW (ref >60)
Glucose, Bld: 182 mg/dL — ABNORMAL HIGH (ref 70–99)
Potassium: 3.8 mmol/L (ref 3.5–5.1)
Sodium: 141 mmol/L (ref 135–145)
Total Bilirubin: 0.5 mg/dL (ref 0.3–1.2)
Total Protein: 6.3 g/dL — ABNORMAL LOW (ref 6.5–8.1)

## 2020-06-10 LAB — PROTEIN / CREATININE RATIO, URINE
Creatinine, Urine: 51 mg/dL
Protein Creatinine Ratio: 8.67 mg/mg{Cre} — ABNORMAL HIGH (ref 0.00–0.15)
Total Protein, Urine: 442 mg/dL

## 2020-06-10 LAB — RESP PANEL BY RT-PCR (FLU A&B, COVID) ARPGX2
Influenza A by PCR: NEGATIVE
Influenza B by PCR: NEGATIVE
SARS Coronavirus 2 by RT PCR: NEGATIVE

## 2020-06-10 LAB — URINE DRUG SCREEN, QUALITATIVE (ARMC ONLY)
Amphetamines, Ur Screen: NOT DETECTED
Barbiturates, Ur Screen: NOT DETECTED
Benzodiazepine, Ur Scrn: NOT DETECTED
Cannabinoid 50 Ng, Ur ~~LOC~~: NOT DETECTED
Cocaine Metabolite,Ur ~~LOC~~: NOT DETECTED
MDMA (Ecstasy)Ur Screen: NOT DETECTED
Methadone Scn, Ur: NOT DETECTED
Opiate, Ur Screen: NOT DETECTED
Phencyclidine (PCP) Ur S: NOT DETECTED
Tricyclic, Ur Screen: NOT DETECTED

## 2020-06-10 LAB — GLUCOSE, CAPILLARY: Glucose-Capillary: 158 mg/dL — ABNORMAL HIGH (ref 70–99)

## 2020-06-10 LAB — TROPONIN I (HIGH SENSITIVITY)
Troponin I (High Sensitivity): 36 ng/L — ABNORMAL HIGH (ref <18)
Troponin I (High Sensitivity): 39 ng/L — ABNORMAL HIGH (ref <18)

## 2020-06-10 LAB — BRAIN NATRIURETIC PEPTIDE: B Natriuretic Peptide: 922 pg/mL — ABNORMAL HIGH (ref 0.0–100.0)

## 2020-06-10 LAB — MAGNESIUM
Magnesium: 1.8 mg/dL (ref 1.7–2.4)
Magnesium: 1.5 mg/dL — ABNORMAL LOW (ref 1.7–2.4)

## 2020-06-10 LAB — CBG MONITORING, ED: Glucose-Capillary: 182 mg/dL — ABNORMAL HIGH (ref 70–99)

## 2020-06-10 LAB — LIPASE, BLOOD: Lipase: 19 U/L (ref 11–51)

## 2020-06-10 MED ORDER — INSULIN ASPART 100 UNIT/ML ~~LOC~~ SOLN
0.0000 [IU] | Freq: Three times a day (TID) | SUBCUTANEOUS | Status: DC
  Administered 2020-06-11: 2 [IU] via SUBCUTANEOUS
  Administered 2020-06-11 – 2020-06-12 (×4): 1 [IU] via SUBCUTANEOUS
  Administered 2020-06-13 (×2): 2 [IU] via SUBCUTANEOUS
  Filled 2020-06-10 (×7): qty 1

## 2020-06-10 MED ORDER — RIVAROXABAN 20 MG PO TABS
20.0000 mg | ORAL_TABLET | Freq: Every day | ORAL | Status: DC
  Administered 2020-06-10 – 2020-06-12 (×3): 20 mg via ORAL
  Filled 2020-06-10 (×3): qty 1

## 2020-06-10 MED ORDER — FUROSEMIDE 10 MG/ML IJ SOLN
40.0000 mg | Freq: Once | INTRAMUSCULAR | Status: AC
  Administered 2020-06-10: 40 mg via INTRAVENOUS
  Filled 2020-06-10: qty 4

## 2020-06-10 MED ORDER — LISINOPRIL 20 MG PO TABS
20.0000 mg | ORAL_TABLET | Freq: Every day | ORAL | Status: DC
Start: 2020-06-11 — End: 2020-06-13
  Administered 2020-06-11 – 2020-06-13 (×3): 20 mg via ORAL
  Filled 2020-06-10 (×3): qty 1

## 2020-06-10 MED ORDER — SODIUM CHLORIDE 0.9 % IV SOLN
INTRAVENOUS | Status: DC | PRN
  Administered 2020-06-10: 10 mL via INTRAVENOUS

## 2020-06-10 MED ORDER — FUROSEMIDE 10 MG/ML IJ SOLN
20.0000 mg | Freq: Once | INTRAMUSCULAR | Status: DC
  Filled 2020-06-10: qty 2

## 2020-06-10 MED ORDER — LORAZEPAM 2 MG/ML IJ SOLN
0.5000 mg | INTRAMUSCULAR | Status: DC | PRN
  Administered 2020-06-11: 0.5 mg via INTRAVENOUS
  Filled 2020-06-10: qty 1

## 2020-06-10 MED ORDER — MAGNESIUM SULFATE 2 GM/50ML IV SOLN
2.0000 g | Freq: Once | INTRAVENOUS | Status: AC
  Administered 2020-06-10: 2 g via INTRAVENOUS
  Filled 2020-06-10: qty 50

## 2020-06-10 MED ORDER — ONDANSETRON HCL 4 MG/2ML IJ SOLN
4.0000 mg | Freq: Once | INTRAMUSCULAR | Status: AC
  Administered 2020-06-10: 4 mg via INTRAVENOUS
  Filled 2020-06-10: qty 2

## 2020-06-10 MED ORDER — ATORVASTATIN CALCIUM 20 MG PO TABS
40.0000 mg | ORAL_TABLET | Freq: Every day | ORAL | Status: DC
  Administered 2020-06-10 – 2020-06-12 (×3): 40 mg via ORAL
  Filled 2020-06-10 (×3): qty 2

## 2020-06-10 MED ORDER — AMLODIPINE BESYLATE 5 MG PO TABS
5.0000 mg | ORAL_TABLET | Freq: Every day | ORAL | Status: DC
  Administered 2020-06-11 – 2020-06-13 (×3): 5 mg via ORAL
  Filled 2020-06-10 (×3): qty 1

## 2020-06-10 MED ORDER — FUROSEMIDE 40 MG PO TABS
40.0000 mg | ORAL_TABLET | Freq: Every day | ORAL | Status: DC
  Filled 2020-06-10: qty 1

## 2020-06-10 MED ORDER — FUROSEMIDE 10 MG/ML IJ SOLN
20.0000 mg | Freq: Two times a day (BID) | INTRAMUSCULAR | Status: DC
  Filled 2020-06-10: qty 2

## 2020-06-10 MED ORDER — LABETALOL HCL 5 MG/ML IV SOLN
20.0000 mg | Freq: Once | INTRAVENOUS | Status: AC
  Administered 2020-06-10: 20 mg via INTRAVENOUS
  Filled 2020-06-10: qty 4

## 2020-06-10 MED ORDER — LABETALOL HCL 5 MG/ML IV SOLN
10.0000 mg | INTRAVENOUS | Status: DC | PRN
  Administered 2020-06-10: 10 mg via INTRAVENOUS
  Filled 2020-06-10 (×2): qty 4

## 2020-06-10 MED ORDER — LABETALOL HCL 5 MG/ML IV SOLN
10.0000 mg | Freq: Once | INTRAVENOUS | Status: AC
  Administered 2020-06-10: 10 mg via INTRAVENOUS
  Filled 2020-06-10: qty 4

## 2020-06-10 NOTE — H&P (Signed)
History and Physical    PLEASE NOTE THAT DRAGON DICTATION SOFTWARE WAS USED IN THE CONSTRUCTION OF THIS NOTE.   Bradley Lopez [id number] DOB: 07-11-1975 DOA: 06/10/2020  PCP: Baxter Hire, MD Patient coming from: home   I have personally briefly reviewed patient's old medical records in [place]  Chief Complaint: Nausea/vomiting  HPI: Bradley Lopez is a 45 y.o. male with medical history significant for poorly controlled hypertension, type 2 diabetes mellitus, history of bilateral lower extremity DVT in 2021 chronically anticoagulated on Xarelto, stage IIIa chronic kidney disease with baseline creatinine 1.7-1.9, who is admitted to [facility] on 06/10/2020 with hypertensive emergency after presenting from home to [facility] ED complaining of nausea/vomiting.   The patient reports at approximately 2200 on 06/09/20 in his normal state of health before awaking at approximately 06/10/2020 with nausea resulting in 3-4 episodes of nonbloody, nonbilious emesis as well as new onset blurry vision in bilateral eyes.  He denies any associated acute focal weakness, acute focal numbness, paresthesias, vertigo, slurred speech, facial droop, dysphagia.  He also denies any associated abdominal pain, hematochezia, melena, or rash.  No recent trauma or travel.  Denies any recent subjective fever, chills, rigors, or generalized illness.  Denies any associated headache, neck stiffness rhinitis, rhinorrhea, sore throat, redness breath, wheezing, cough.  No recent known COVID-19 exposures.  Denies any recent dysuria, gross hematuria, or change in urinary urgency/frequency.  He also denies any associated chest pain, diaphoresis, palpitations, dizziness, presyncope, or syncope.  The patient conveys blood pressure is typically poorly controlled, with frequent systolic blood pressures in the high 190s mmHg when he has been checking over the last several months as an outpatient.  This is in the  context of his sole outpatient antihypertensive agent in the form of lisinopril 20 mg p.o. daily.  Medical history is also notable for history of stage IIIa chronic kidney disease with baseline creatinine range of 1.7-1.9.  The patient reports scheduled to establish with neurology on March 3.   He notes progressive amount of peripheral edema over the last 2 months.  Specifically reports progressive pitting edema in the bilateral lower extremities, with more recent development of scrotal swelling.  Prior to the last 2 months, the patient denies any history of prior peripheral edema.  In the setting of this new development of edema, patient's PCP initiated prescription of Lasix 40 mg p.o. daily a few weeks ago.  While the patient acknowledges good compliance with interval, he denies any significant improvement in his peripheral edema or scrotal swelling over that time.  Any known history of underlying liver failure, and denies any routine alcohol consumption.  Denies any known history of nephrotic syndrome.  No recent traveling.   The patient ports that he has previously developed bilateral diminished vision acuity when his blood sugar is running high.  He reports that the blurry vision with which he presents to the ED today is similar to that which she has previously experienced in the setting of suspected hyperglycemia.      ED Course:  Vital signs in the ED were notable for the following: Temperature max 97.7, heart rate 89-1 09, respiratory rate 15-20, oxygen saturation 98 to 100% on room air.  Initial blood pressure noted to be 252/139, with MAP of 177.  Subsequently, following labetalol 10 mg IV x1 as well as labetalol 20 mg IV x1, blood pressure was noted to decrease into the systolic low 123456 mmHg.   Labs were notable for  the following: CMP was notable for the following: Sodium 141, potassium 3.8, bicarbonate 28, creatinine 1.63 relative to most recent prior creatinine data point of 1.90 on  05/09/2020, glucose 182.  BNP found to be 922, with no prior BMP data points available in our EMR for point comparison.  CBC notable for white blood cell count of 10,700.  High-sensitivity troponin I initially noted to be 36, with repeat value trending up slightly to 39, with no prior data points available for point of comparison.  Urinalysis was notable for showing greater than 50 white blood cells, rare bacteria, positive hyaline casts, 300 protein, greater than 50 RBCs.  Additionally, screening nasopharyngeal COVID-19 PCR performed in the ED this evening was found to be negative.  EKG showed sinus tachycardia with heart rate 107, QTc 482, and no evidence of T wave or ST changes, including no evidence of ST elevation.  2 view chest x-ray showed mild bibasilar atelectasis with small pleural effusions.  Noncontrast CT that showed no evidence of acute cranial process, while MRI of the brain showed no evidence of acute intracranial process, including no evidence of acute ischemic infarct and no evidence of cerebral edema.  Ultrasound scrotum showed no evidence of testicular torsion or intratesticular mass, but showed evidence of scrotal wall edema.  While in the ED, the following were administered: In addition to the previously described 2 doses of IV labetalol, the patient also received Zofran 4 mg IV x1 while in the ED.  Subsequently, the patient was admitted to the PCU for further evaluation and management of suspected presenting hypertensive emergency.     Review of Systems: As per HPI otherwise 10 point review of systems negative.   Past Medical History:  Diagnosis Date  . Asthma   . Diabetes mellitus without complication ([place])   . Hypertension     History reviewed. No pertinent surgical history.  Social History:  reports that he has never smoked. He has never used smokeless tobacco. He reports previous alcohol use. He reports previous drug use.   Allergies  Allergen Reactions  . Pine  Hives and Itching    Family history reviewed and not pertinent    Prior to Admission medications   Medication Sig Start Date End Date Taking? Authorizing Provider  atorvastatin (LIPITOR) 40 MG tablet Take 40 mg by mouth at bedtime. 05/17/20  Yes [provider]  furosemide (LASIX) 40 MG tablet Take 40 mg by mouth daily. 06/07/20  Yes [provider]  glipiZIDE (GLUCOTROL XL) 2.5 MG 24 hr tablet Take 2.5 mg by mouth daily. 05/17/20  Yes [provider]  ibuprofen (ADVIL) 800 MG tablet Take 800 mg by mouth 3 (three) times daily as needed for moderate pain. 06/03/20  Yes [provider]  lisinopril (ZESTRIL) 20 MG tablet Take 20 mg by mouth daily. 05/17/20  Yes [provider]  rivaroxaban (XARELTO) 20 MG TABS tablet Take 1 tablet (20 mg total) by mouth daily with supper. 05/24/20  Yes Kris Hartmann, NP     Objective    Physical Exam: Vitals:   06/10/20 1044 06/10/20 1047 06/10/20 1049 06/10/20 1209  BP:   (!) 199/113 (!) 183/99  Pulse: (!) 103 95 94 94  Resp: '10 13 10 11  '$ Temp:      TempSrc:      SpO2: 96% 98% 99% 99%  Weight:      Height:        General: appears to be stated age; alert, oriented Skin:  warm, dry, no rash Head:  AT/Notus Mouth:  Oral mucosa membranes appear moist, normal dentition Neck: supple; trachea midline Heart:  RRR; did not appreciate any M/R/G Lungs: CTAB, did not appreciate any wheezes, rales, or rhonchi Abdomen: + BS; soft, ND, NT Vascular: 2+ pedal pulses b/l; 2+ radial pulses b/l Extremities: no peripheral edema, no muscle wasting Neuro: 5/5 strength of the proximal and distal flexors and extensors of the upper and lower extremities bilaterally; sensation intact in upper and lower extremities b/l; binocular diminished visual acuity. Otherwise, cranial nerves II through XII grossly intact; no pronator drift; no evidence suggestive of slurred speech, dysarthria, or facial droop; Normal muscle tone. No  tremors.    Labs on Admission: I have personally reviewed following labs and imaging studies  CBC: Recent Labs  Lab 06/10/20 1029  WBC 10.7*  NEUTROABS 7.8*  HGB 10.2*  HCT 30.9*  MCV 86.1  PLT 123XX123*   Basic Metabolic Panel: Recent Labs  Lab 06/10/20 1029  NA 141  K 3.8  CL 105  CO2 28  GLUCOSE 182*  BUN 19  CREATININE 1.63*  CALCIUM 8.6*  MG 1.8   GFR: Estimated Creatinine Clearance: 76.4 mL/min (A) (by C-G formula based on SCr of 1.63 mg/dL (H)). Liver Function Tests: Recent Labs  Lab 06/10/20 1029  AST 48*  ALT 30  ALKPHOS 332*  BILITOT 0.5  PROT 6.3*  ALBUMIN 2.3*   Recent Labs  Lab 06/10/20 1029  LIPASE 19   No results for input(s): AMMONIA in the last 168 hours. Coagulation Profile: No results for input(s): INR, PROTIME in the last 168 hours. Cardiac Enzymes: No results for input(s): CKTOTAL, CKMB, CKMBINDEX, TROPONINI in the last 168 hours. BNP (last 3 results) No results for input(s): PROBNP in the last 8760 hours. HbA1C: No results for input(s): HGBA1C in the last 72 hours. CBG: Recent Labs  Lab 06/10/20 1001  GLUCAP 182*   Lipid Profile: No results for input(s): CHOL, HDL, LDLCALC, TRIG, CHOLHDL, LDLDIRECT in the last 72 hours. Thyroid Function Tests: No results for input(s): TSH, T4TOTAL, FREET4, T3FREE, THYROIDAB in the last 72 hours. Anemia Panel: No results for input(s): VITAMINB12, FOLATE, FERRITIN, TIBC, IRON, RETICCTPCT in the last 72 hours. Urine analysis: No results found for: COLORURINE, APPEARANCEUR, LABSPEC, [place], GLUCOSEU, HGBUR, BILIRUBINUR, KETONESUR, PROTEINUR, UROBILINOGEN, NITRITE, LEUKOCYTESUR  Radiological Exams on Admission: DG Chest 2 View  Result Date: 06/10/2020 CLINICAL DATA:  Shortness of breath. EXAM: CHEST - 2 VIEW COMPARISON:  None. FINDINGS: The heart size and mediastinal contours are within normal limits. No pneumothorax is noted. Mild bibasilar atelectasis or edema is noted with small pleural  effusions. The visualized skeletal structures are unremarkable. IMPRESSION: Mild bibasilar atelectasis or edema is noted with small pleural effusions. Electronically Signed   By: Marijo Conception M.D.   On: 06/10/2020 11:24   US SCROTUM W/DOPPLER  Result Date: 06/10/2020 CLINICAL DATA:  Testicular pain and swelling EXAM: SCROTAL ULTRASOUND DOPPLER ULTRASOUND OF THE TESTICLES TECHNIQUE: Complete ultrasound examination of the testicles, epididymis, and other scrotal structures was performed. Color and spectral Doppler ultrasound were also utilized to evaluate blood flow to the testicles. COMPARISON:  None. FINDINGS: Right testicle Measurements: 4.4 x 2.9 x 2.9 cm. No mass is visualized. Scattered microlithiasis. Left testicle Measurements: 4.0 x 3.1 x 2.7 cm. No mass is visualized. Scattered microlithiasis. Right epididymis:  Normal in size and appearance. Left epididymis:  Normal in size and appearance. Hydrocele:  None visualized. Varicocele:  None visualized. Pulsed Doppler interrogation  of both testes demonstrates normal low resistance arterial and venous waveforms bilaterally. Other: Marked diffuse scrotal wall edema. No organized fluid collection identified. IMPRESSION: 1. Negative for testicular torsion or intratesticular mass. 2. Marked diffuse scrotal wall edema. Findings are nonspecific and may be secondary to anasarca, as was present on the recent CT. Cellulitis could have a similar appearance in the appropriate clinical setting. No organized fluid collection was identified. 3. Bilateral microlithiasis. Current literature suggests that testicular microlithiasis is not a significant independent risk factor for development of testicular carcinoma, and that follow up imaging is not warranted in the absence of other risk factors. Monthly testicular self-examination and annual physical exams are considered appropriate surveillance. If patient has other risk factors for testicular carcinoma, then referral to  Urology should be considered. (Reference: DeCastro, et al.: A 5-Year Follow up Study of Asymptomatic Men with Testicular Microlithiasis. J Urol 2008; [id number].) Electronically Signed   By: Davina Poke D.O.   On: 06/10/2020 12:22   CT HEAD CODE STROKE WO CONTRAST`  Result Date: 06/10/2020 CLINICAL DATA:  Code stroke. Neuro deficit, acute, stroke suspected. Additional history provided: Patient reports upper leg and groin swelling since November, blurred vision since 5 a.m. EXAM: CT HEAD WITHOUT CONTRAST TECHNIQUE: Contiguous axial images were obtained from the base of the skull through the vertex without intravenous contrast. COMPARISON:  No pertinent prior exams available for comparison. FINDINGS: Brain: Cerebral volume is normal. There is no acute intracranial hemorrhage. No demarcated cortical infarct. No extra-axial fluid collection. No evidence of intracranial mass. No midline shift. Vascular: No hyperdense vessel. Skull: Normal. Negative for fracture or focal lesion. Sinuses/Orbits: Visualized orbits show no acute finding. Trace scattered paranasal sinus mucosal thickening at the imaged levels. ASPECTS ([place] Stroke Program Early CT Score) - Ganglionic level infarction (caudate, lentiform nuclei, internal capsule, insula, M1-M3 cortex): 7 - Supraganglionic infarction (M4-M6 cortex): 3 Total score (0-10 with 10 being normal): 10 These results were called by telephone at the time of interpretation on 06/10/2020 at 11:05 am to provider [facility] , who verbally acknowledged these results. IMPRESSION: No evidence of acute intracranial abnormality.  ASPECTS is 10. Minimal paranasal sinus mucosal thickening at the imaged levels. Electronically Signed   By: Kellie Simmering DO   On: 06/10/2020 11:06     EKG: Independently reviewed, with result as described above.    Assessment/Plan   Zamarion Evert is a 45 y.o. male with medical history significant for poorly controlled hypertension, type 2  diabetes mellitus, history of bilateral lower extremity DVT in 2021 chronically anticoagulated on Xarelto, stage IIIa chronic kidney disease with baseline creatinine 1.7-1.9, who is admitted to [facility] on 06/10/2020 with hypertensive emergency after presenting from home to [facility] ED complaining of nausea/vomiting.     Principal Problem:   Hypertensive emergency Active Problems:   Mixed hyperlipidemia   Type 2 diabetes mellitus without complication, without long-term current use of insulin (HCC)   Nausea & vomiting   Anasarca   Elevated troponin   Hypomagnesemia   CKD (chronic kidney disease) stage 3, GFR 30-59 ml/min (HCC)     #) Hypertensive emergency: Diagnosis on the basis of presenting blood pressure 252/139 with MAP of 177 mmHg, with suspected evidence of associated endorgan damage in the form of concomitant presenting mild elevation in transaminases as well as, N/v, acutely diminished bilateral visual acuity, particularly given MRI brain showing no evidence of acute stroke and also given some degree of improvement in diminished visual acuity has blood  pressure begins to improve.  Following 2 doses of IV labetalol, systolic blood pressure has now decreased into the low 200s mmHg. of note, this is in the context of the patient's report of chronically poorly controlled pressure as an outpatient, with typical systolic blood pressures in the high 190s to low 200s mmHg in spite of good compliance with his lisinopril 20 mg p.o. daily.  Overall, presenting n/v and acute admission bilateral is felt to be less likely on the basis of acute ischemic cva.  Showing absolutely no evidence of acute ischemic infarct, suspect that the symptoms may be as a consequence of hypertensive emergency, particularly given gradual improvement with blood pressure slowly trending down, as further quantified below.  Of note, MRI of the brain also showed no evidence of cerebral edema, which was notable  given differential inclusion of PRES. additionally, high-grade stenosis of moderate or large vessel is felt to be less likely given absence of any findings of acute ischemic stroke as well as full improving nature of the patient's nausea/vomiting and Diminished visual acuity, as noted above.  Additionally thrombus in the moderate to large vasculature also appears to be less likely given the patient's reported good compliance with chronic anticoagulation via Xarelto.  However, will further evaluate with MRA brain and ultrasound of b/l carotids, with patient's renal function providing limitations to pursuit of CTA of the head and neck with contrast.  In terms of short-term blood pressure goals relating to this presentation, the goal of 10 to 20% reduction in systolic blood pressure over the first hour following presentation is been achieved.  Subsequent blood pressure goal includes a further reduction by 5 to 15% over the next 24 hours resulting in a total reduction in systolic blood pressure/map by 15 to 35% over that period, which would coincide with goal systolic blood pressure of between 164 - 214 mmHg.  Will exhibit caution with avoiding overly aggressive treatment of blood pressure assessment relative hypoperfusion consequences due to abrupt change in ensuing blood pressure.  Rather, I have ordered as needed IV labetalol for systolic blood pressure greater than 190 mmHg.  The patient would appear to benefit from optimization the efficacy of his outpatient hypertensive regimen beyond that offered by current lisinopril 20 mg p.o. daily.  As such, Norvasc 5 mg p.o. daily to start tomorrow morning, which will coincide with the conclusion of the 24-hour.  Following development of the above symptoms.  As current: Blood pressure in the setting of hypertensive emergency has been achieved, there does not appear to be a current indication for initiation of an antihypertensive drip.   Plan: Close monitoring of ensuing  blood pressures, with goal blood pressures as outlined above.  As needed IV labetalol for systolic blood pressures greater than 190 mmhg. monitor on telemetry.  Repeat CMP in the morning, with close attention for evaluation of endorgan damage.  Check TSH and UDS.  Monitor strict I's and O's.  Monitor on telemetry.  Initiation of Norvasc for morning, as above along with continuation of lisinopril at that time.  MRI brain.  Bilateral carotid ultrasound as further described above.  Serial overnight neurochecks.     #) Nausea/vomiting: 3-4 episodes of nonbloody, nonbilious emesis, which has improved following improving blood pressures with as needed dose of IV labetalol, as above.  Suspect that presenting nausea/vomiting was on the basis neurologic consequence from hypertensive emergency, as above.  Given presenting EKG suggestive of borderline QTC prolongation, I have ordered as needed IV Ativan as the  patient's current as needed antiemetic.  We will continue as needed IV Ativan.  Additional work-up and management of presenting suspected hypertensive emergency, as above.  Monitor on telemetry.  Evaluation and management of concomitant presenting hypomagnesemia, including plan for IV supplementation, as further described below.  Repeat CMP in the morning.      #) Anasarca: Patient reports last 2 months, with minimal if any improvement following recent initiation of Lasix 40 mg p.o. daily.  Underlying source currently unclear.  An outpatient echocardiogram has been ordered by the patient's PCP, although this imaging study has not yet occurred.  We will go ahead and order echocardiogram to be performed during this hospitalization.  No prior known history of underlying chronic heart failure.  Presenting chest x-ray shows mild bibasilar atelectasis with small pleural effusions, in the absence of overt evidence for pulmonary edema.  Presenting urinalysis showed greater than 300 protein as well as greater than 50  RBCs.  Consequently, differential also includes nephrotic syndrome, and will order routine to creatinine ratio to further evaluate this possibility.  We will also check to evaluate synthetic hepatic function, although the patient denies any known underlying history of liver disease.   Plan: Echocardiogram ordered for tomorrow morning, as above.  Monitor strict I's and O's and daily weights.  Ordered Lasix 40 mg IV twice daily for now.  Check INR, as above.  Check urine protein to creatinine ratio.  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  Repeat CMP in the morning.  Add on serum magnesium.  Would likely benefit from elevation of the bilateral lower extremities gravity to assist with improvement in distribution of edema.     #) Hypomagnesemia: Presenting serum magnesium level found to be 1.5.  Suspected ensuing diuresis efforts, will provide IV magnesium supplementation, as further outlined below.  Plan: Magnesium sulfate 2 g IV every 2 hours x1 dose now.  Repeat serum magnesium level in the morning.  Monitor on telemetry.      #) Type 2 diabetes mellitus: On glipizide a sole outpatient oral hypoglycemic agent.  Not on any exogenous insulin at home.  Presenting blood sugar, per presenting BMP noted to be 182.  Plan: We will hold home glipizide during his hospitalization.  Accu-Cheks before every meal and at bedtime with low-dose sliding scale insulin.  We will also check hemoglobin A1c.      #) Hyperlipidemia: On high intensity atorvastatin as an outpatient.  Plan: Continue home statin.      #) Stage IIIa chronic kidney disease: Associated with baseline creatinine range of 1.7-1.9, with patient reporting that he has upcoming scheduled establishment of care appointment with nephrology to occur on March 3.  Presenting urinalysis notable for the presence of greater than 300 protein as well as  greater than 50 RBCs, which warrants further evaluation, including for that of nephrotic syndrome, particularly given findings suggestive of anasarca, as above.  Of note, presenting creatinine found to be within his baseline range.    Plan: Monitor strict I's and O's and daily weights.  Tempt avoid nephrotoxic agents.  Add on urine protein to creatinine ratio, as above.  Repeat CMP in the morning, particularly for evaluation of any interval increase in Cr as consequent of  potential renal impacts of presenting hypertensive emergency. encourage the patient to maintain his establishment of care appointment with nephrology, as above.      #) Elevated troponin: mildly elevated initial troponin of  36, which is subsequently increased slightly to 39, with no prior high sensitivity troponin I value available for point of comparison.  Suspect that this mildly elevated troponin is on the basis of supply demand mismatch in the setting of endorgan damage as a result of presenting hypertensive emergency as opposed to representing a type I process due to acute plaque rupture.  EKG shows no evidence of acute ischemic changes, including no evidence of STEMI, while presentation is not associated with any chest pain.  Overall, ACS is felt to be less likely relative to type II supply demand mismatch, as above, but will closely monitor on telemetry overnight, while treating suspected underlying hypertensive emergency, as further described above.  Presentation is clinically less suggestive of acute pulmonary embolism, with this possibility further diminished in the setting of patient's reported good compliance with his chronic anticoagulation on Xarelto.   Plan: repeat troponin in the AM. Monitor on telemetry. PRN EKG for development of chest pain.  Repeat CMP in the morning as well as serum magnesium level at that time.  Additional evaluation management of presenting hypertensive emergency, as further described above.   Echocardiogram has been ordered in the morning, in order to evaluate progressive anasarca over the last 2 months, but will also additional benefit of evaluating for the presence of focal wall motion normalities given mildly elevated presenting troponin values.     DVT prophylaxis: Continue home Xarelto Code Status: Full code Family Communication: none Disposition Plan: Per Rounding Team Consults called: none  Admission status: Observation, PCU    Of note, this patient was added by me to the following Admit List/Treatment Team: armcad[facility].      PLEASE NOTE THAT DRAGON DICTATION SOFTWARE WAS USED IN THE CONSTRUCTION OF THIS NOTE.   [facility] Pager [phone] From 12PM - 12AM  Otherwise, please contact night-coverage  [email or web address] Password [email or web address]   06/10/2020, 1:16 PM

## 2020-06-10 NOTE — ED Provider Notes (Signed)
[facility] Emergency Department Provider Note   ____________________________________________   Event Date/Time   First MD Initiated Contact with Patient 06/10/20 1004     (approximate)  I have reviewed the triage vital signs and the nursing notes.   HISTORY  Chief Complaint Groin Swelling and Blurred Vision    HPI Bradley Lopez is a 45 y.o. male with past medical history of hypertension, diabetes, asthma, DVT on Xarelto, and lymphedema who presents to the ED for groin swelling and blurry vision.  Patient reports that he has been dealing with increasing swelling in both of his lower extremities as well as his groin for the past couple of months.  Swelling has become painful, especially in his groin, but he denies any redness, wounds, or drainage.  He has not had any difficulty urinating and denies any dysuria or hematuria.  He was recently started on Lasix by his PCP and swelling in his legs has improved, but swelling in his groin persists.  He then woke up this morning around 5 AM with blurry vision affecting both eyes.  He reports significant difficulty seeing but denies any speech changes, numbness, or weakness in his extremities.  He has started feeling nauseous with multiple episodes of vomiting.  He has been compliant with his blood pressure medications and denies any fever, cough, chest pain, or shortness of breath.        Past Medical History:  Diagnosis Date  . Asthma   . Diabetes mellitus without complication ([place])   . Hypertension     Patient Active Problem List   Diagnosis Date Noted  . Lymphedema 05/28/2020  . Pain and swelling of lower leg 05/02/2020  . Acute right-sided low back pain without sciatica 11/21/2018  . History of DVT of lower extremity 11/21/2018  . Mixed hyperlipidemia 11/21/2018  . DVT femoral (deep venous thrombosis) with thrombophlebitis, bilateral ([place]) 08/20/2018  . Essential hypertension 08/20/2018  . Generalized  anxiety disorder 08/20/2018  . Type 2 diabetes mellitus without complication, without long-term current use of insulin ([place]) 08/20/2018    History reviewed. No pertinent surgical history.  Prior to Admission medications   Medication Sig Start Date End Date Taking? Authorizing Provider  atorvastatin (LIPITOR) 40 MG tablet Take 40 mg by mouth at bedtime. 05/17/20  Yes [provider]  furosemide (LASIX) 40 MG tablet Take 40 mg by mouth daily. 06/07/20  Yes [provider]  glipiZIDE (GLUCOTROL XL) 2.5 MG 24 hr tablet Take 2.5 mg by mouth daily. 05/17/20  Yes [provider]  ibuprofen (ADVIL) 800 MG tablet Take 800 mg by mouth 3 (three) times daily as needed for moderate pain. 06/03/20  Yes [provider]  lisinopril (ZESTRIL) 20 MG tablet Take 20 mg by mouth daily. 05/17/20  Yes [provider]  rivaroxaban (XARELTO) 20 MG TABS tablet Take 1 tablet (20 mg total) by mouth daily with supper. 05/24/20  Yes Kris Hartmann, NP    Allergies Pine  No family history on file.  Social History Social History   Tobacco Use  . Smoking status: Never Smoker  . Smokeless tobacco: Never Used  Substance Use Topics  . Alcohol use: Not Currently  . Drug use: Not Currently    Review of Systems  Constitutional: No fever/chills Eyes: Positive for blurry vision. ENT: No sore throat. Cardiovascular: Denies chest pain. Respiratory: Denies shortness of breath. Gastrointestinal: No abdominal pain.  Positive for vomiting.  No diarrhea.  No constipation. Genitourinary: Negative for dysuria.  Positive for groin swelling and pain. Musculoskeletal: Negative for back pain. Skin: Negative for rash. Neurological: Negative for headaches, focal weakness or numbness.  ____________________________________________   PHYSICAL EXAM:  VITAL SIGNS: ED Triage Vitals  Enc Vitals Group     BP 06/10/20 1000 (!) 212/125     Pulse Rate 06/10/20 1000 (!) 104     Resp  06/10/20 1000 20     Temp 06/10/20 1000 97.7 F (36.5 C)     Temp Source 06/10/20 1000 Oral     SpO2 --      Weight 06/10/20 1000 230 lb (104.3 kg)     Height 06/10/20 1000 '6\' 3"'$  (1.905 m)     Head Circumference --      Peak Flow --      Pain Score 06/10/20 1000 7     Pain Loc --      Pain Edu? --      Excl. in [place]? --     Constitutional: Alert and oriented. Eyes: Conjunctivae are normal.  Pupils equal round reactive to light bilaterally. Head: Atraumatic. Nose: No congestion/rhinnorhea. Mouth/Throat: Mucous membranes are moist. Neck: Normal ROM Cardiovascular: Tachycardic, regular rhythm. Grossly normal heart sounds. Respiratory: Normal respiratory effort.  No retractions. Lungs CTAB. Gastrointestinal: Soft and diffusely tender to palpation with no rebound or guarding. No distention. Genitourinary: Significantly edematous scrotum and penis with no erythema, warmth, or tenderness. No skin lesions noted. Musculoskeletal: No lower extremity tenderness, 2+ pitting edema to mid thighs bilaterally. Neurologic:  Normal speech and language. No gross focal neurologic deficits are appreciated. Skin:  Skin is warm, dry and intact. No rash noted. Psychiatric: Mood and affect are normal. Speech and behavior are normal.  ____________________________________________   LABS (all labs ordered are listed, but only abnormal results are displayed)  Labs Reviewed  CBC WITH DIFFERENTIAL/PLATELET - Abnormal; Notable for the following components:      Result Value   WBC 10.7 (*)    RBC 3.59 (*)    Hemoglobin 10.2 (*)    HCT 30.9 (*)    Platelets 446 (*)    Neutro Abs 7.8 (*)    All other components within normal limits  COMPREHENSIVE METABOLIC PANEL - Abnormal; Notable for the following components:   Glucose, Bld 182 (*)    Creatinine, Ser 1.63 (*)    Calcium 8.6 (*)    Total Protein 6.3 (*)    Albumin 2.3 (*)    AST 48 (*)    Alkaline Phosphatase 332 (*)    GFR, Estimated 53 (*)    All  other components within normal limits  BRAIN NATRIURETIC PEPTIDE - Abnormal; Notable for the following components:   B Natriuretic Peptide 922.0 (*)    All other components within normal limits  CBG MONITORING, ED - Abnormal; Notable for the following components:   Glucose-Capillary 182 (*)    All other components within normal limits  TROPONIN I (HIGH SENSITIVITY) - Abnormal; Notable for the following components:   Troponin I (High Sensitivity) 36 (*)    All other components within normal limits  TROPONIN I (HIGH SENSITIVITY) - Abnormal; Notable for the following components:   Troponin I (High Sensitivity) 39 (*)    All other components within normal limits  RESP PANEL BY RT-PCR (FLU A&B, COVID) ARPGX2  LIPASE, BLOOD  MAGNESIUM  URINALYSIS, COMPLETE (UACMP) WITH MICROSCOPIC   ____________________________________________  EKG  ED ECG REPORT I, Blake Divine, the attending physician, personally viewed and interpreted this ECG.  Date: 06/10/2020  EKG Time: 10:27  Rate: 107  Rhythm: sinus tachycardia  Axis: Normal  Intervals:Borderline prolonged QT  ST&T Change: None   PROCEDURES  Procedure(s) performed (including Critical Care):  .Critical Care Performed by: Blake Divine, MD Authorized by: Blake Divine, MD   Critical care provider statement:    Critical care time (minutes):  45   Critical care time was exclusive of:  Separately billable procedures and treating other patients and teaching time   Critical care was necessary to treat or prevent imminent or life-threatening deterioration of the following conditions:  CNS failure or compromise   Critical care was time spent personally by me on the following activities:  Discussions with consultants, evaluation of patient's response to treatment, examination of patient, ordering and performing treatments and interventions, ordering and review of laboratory studies, ordering and review of radiographic studies, pulse  oximetry, re-evaluation of patient's condition, obtaining history from patient or surrogate and review of old charts   I assumed direction of critical care for this patient from another provider in my specialty: no       ____________________________________________   INITIAL IMPRESSION / ASSESSMENT AND PLAN / ED COURSE       45 year old male with past medical history of hypertension, diabetes, asthma, DVT on Xarelto, and lymphedema who presents to the ED with worsening groin swelling and pain for the past couple of months accompanied with acute onset blurry vision with nausea and vomiting starting this morning around 5 AM.  Given bilateral blurry vision, stroke seems less likely, and patient would be outside the window for TPA given onset about 5 hours prior to arrival.  I doubt large vessel occlusion and there is no indication for code stroke at this time, I would be more suspicious for PRES given patient's marked hypertension.  We will treat with IV labetalol and reassess, he may end up requiring Cardene drip.  We will also check CT head as well as scrotal ultrasound, although no evidence of scrotal infection apparent on exam.  CT head reviewed by me and shows no obvious hemorrhage, negative for acute process per radiology. Chest x-ray reviewed by me and shows possible pulmonary edema but no focal infiltrates. Patient's blood pressure gradually improving following initial dose of labetalol, we will give follow-up dose as well as Lasix for his peripheral edema. The goal will be to reduce his MAP by 20% given concern for hypertensive encephalopathy. Patient reports some improvement in his blurry vision, however it has not resolved. We will further assess with MRI. Labs remarkable for elevated BNP as well as creatinine and I am concerned for new onset CHF. He would benefit from echocardiogram and further cardiac work-up during admission as well. Given improving blood pressure, I do not feel he requires  Cardene drip at this time and case discussed with hospitalist for admission.      ____________________________________________   FINAL CLINICAL IMPRESSION(S) / ED DIAGNOSES  Final diagnoses:  Hypertensive encephalopathy syndrome  Scrotal edema     ED Discharge Orders    None       Note:  This document was prepared using Dragon voice recognition software and may include unintentional dictation errors.   Blake Divine, MD 06/10/20 1314

## 2020-06-10 NOTE — ED Triage Notes (Signed)
Pt c/o upper leg and groin swelling since November and has been seeing dr Edwina Barth for that and put on lasix, pt c/o blurred vision since 5am when he woke up, ok last night.. pt states he thinks his sugar is running high.

## 2020-06-10 NOTE — ED Notes (Signed)
Pt remains in MRI at this time  

## 2020-06-10 NOTE — Progress Notes (Signed)
   06/10/20 1500  Assess: MEWS Score  Temp 98.2 F (36.8 C)  BP (!) 206/120  Pulse Rate 95  Resp 20  Level of Consciousness Alert  SpO2 100 %  O2 Device Room Air  Assess: MEWS Score  MEWS Temp 0  MEWS Systolic 2  MEWS Pulse 0  MEWS RR 0  MEWS LOC 0  MEWS Score 2  MEWS Score Color Yellow  Assess: if the MEWS score is Yellow or Red  Were vital signs taken at a resting state? Yes  Focused Assessment No change from prior assessment  Early Detection of Sepsis Score *See Row Information* Low  MEWS guidelines implemented *See Row Information* Yes  Treat  MEWS Interventions Escalated (See documentation below)  Pain Scale 0-10  Pain Score 0  Take Vital Signs  Increase Vital Sign Frequency  Yellow: Q 2hr X 2 then Q 4hr X 2, if remains yellow, continue Q 4hrs  Escalate  MEWS: Escalate Yellow: discuss with charge nurse/RN and consider discussing with provider and RRT  Notify: Charge Nurse/RN  Name of Charge Nurse/RN Notified stephanie summers  Date Charge Nurse/RN Notified 06/10/20  Time Charge Nurse/RN Notified 1500   Patient MEWS yellow due to systolic BP. Patient given PRN labetalol See MAR and flowsheet for reassessment of vitals. Patient c/o blurred vision, no change from prior assessment as per patient.

## 2020-06-11 ENCOUNTER — Observation Stay
Admit: 2020-06-11 | Discharge: 2020-06-11 | Disposition: A | Discharge status: Home / Self Care | Payer: BC Managed Care – PPO | Attending: Internal Medicine | Admitting: Internal Medicine

## 2020-06-11 ENCOUNTER — Observation Stay: Payer: BC Managed Care – PPO

## 2020-06-11 DIAGNOSIS — Z86718 Personal history of other venous thrombosis and embolism: Secondary | ICD-10-CM | POA: Diagnosis not present

## 2020-06-11 DIAGNOSIS — I82413 Acute embolism and thrombosis of femoral vein, bilateral: Secondary | ICD-10-CM | POA: Diagnosis not present

## 2020-06-11 DIAGNOSIS — R7989 Other specified abnormal findings of blood chemistry: Secondary | ICD-10-CM | POA: Diagnosis present

## 2020-06-11 DIAGNOSIS — Z20822 Contact with and (suspected) exposure to covid-19: Secondary | ICD-10-CM | POA: Diagnosis present

## 2020-06-11 DIAGNOSIS — R778 Other specified abnormalities of plasma proteins: Secondary | ICD-10-CM | POA: Diagnosis present

## 2020-06-11 DIAGNOSIS — N1831 Chronic kidney disease, stage 3a: Secondary | ICD-10-CM | POA: Diagnosis present

## 2020-06-11 DIAGNOSIS — I129 Hypertensive chronic kidney disease with stage 1 through stage 4 chronic kidney disease, or unspecified chronic kidney disease: Secondary | ICD-10-CM | POA: Diagnosis present

## 2020-06-11 DIAGNOSIS — R601 Generalized edema: Secondary | ICD-10-CM | POA: Diagnosis present

## 2020-06-11 DIAGNOSIS — Z79899 Other long term (current) drug therapy: Secondary | ICD-10-CM | POA: Diagnosis not present

## 2020-06-11 DIAGNOSIS — I161 Hypertensive emergency: Secondary | ICD-10-CM | POA: Diagnosis present

## 2020-06-11 DIAGNOSIS — E876 Hypokalemia: Secondary | ICD-10-CM | POA: Diagnosis present

## 2020-06-11 DIAGNOSIS — H538 Other visual disturbances: Secondary | ICD-10-CM | POA: Diagnosis not present

## 2020-06-11 DIAGNOSIS — E782 Mixed hyperlipidemia: Secondary | ICD-10-CM | POA: Diagnosis present

## 2020-06-11 DIAGNOSIS — D631 Anemia in chronic kidney disease: Secondary | ICD-10-CM | POA: Diagnosis present

## 2020-06-11 DIAGNOSIS — N170 Acute kidney failure with tubular necrosis: Secondary | ICD-10-CM | POA: Diagnosis not present

## 2020-06-11 DIAGNOSIS — Z9119 Patient's noncompliance with other medical treatment and regimen: Secondary | ICD-10-CM | POA: Diagnosis not present

## 2020-06-11 DIAGNOSIS — E1165 Type 2 diabetes mellitus with hyperglycemia: Secondary | ICD-10-CM | POA: Diagnosis present

## 2020-06-11 DIAGNOSIS — E1329 Other specified diabetes mellitus with other diabetic kidney complication: Secondary | ICD-10-CM | POA: Diagnosis not present

## 2020-06-11 DIAGNOSIS — Z7901 Long term (current) use of anticoagulants: Secondary | ICD-10-CM | POA: Diagnosis not present

## 2020-06-11 DIAGNOSIS — N049 Nephrotic syndrome with unspecified morphologic changes: Secondary | ICD-10-CM | POA: Diagnosis present

## 2020-06-11 DIAGNOSIS — E1122 Type 2 diabetes mellitus with diabetic chronic kidney disease: Secondary | ICD-10-CM | POA: Diagnosis present

## 2020-06-11 DIAGNOSIS — I674 Hypertensive encephalopathy: Secondary | ICD-10-CM | POA: Diagnosis present

## 2020-06-11 DIAGNOSIS — E1121 Type 2 diabetes mellitus with diabetic nephropathy: Secondary | ICD-10-CM | POA: Diagnosis not present

## 2020-06-11 DIAGNOSIS — N5089 Other specified disorders of the male genital organs: Secondary | ICD-10-CM | POA: Diagnosis present

## 2020-06-11 DIAGNOSIS — I160 Hypertensive urgency: Secondary | ICD-10-CM | POA: Diagnosis not present

## 2020-06-11 DIAGNOSIS — R112 Nausea with vomiting, unspecified: Secondary | ICD-10-CM | POA: Diagnosis present

## 2020-06-11 DIAGNOSIS — N2581 Secondary hyperparathyroidism of renal origin: Secondary | ICD-10-CM | POA: Diagnosis present

## 2020-06-11 DIAGNOSIS — E11319 Type 2 diabetes mellitus with unspecified diabetic retinopathy without macular edema: Secondary | ICD-10-CM | POA: Diagnosis present

## 2020-06-11 DIAGNOSIS — N183 Chronic kidney disease, stage 3 unspecified: Secondary | ICD-10-CM | POA: Diagnosis present

## 2020-06-11 DIAGNOSIS — Z7984 Long term (current) use of oral hypoglycemic drugs: Secondary | ICD-10-CM | POA: Diagnosis not present

## 2020-06-11 LAB — COMPREHENSIVE METABOLIC PANEL
ALT: 21 U/L (ref 0–44)
AST: 27 U/L (ref 15–41)
Albumin: 1.9 g/dL — ABNORMAL LOW (ref 3.5–5.0)
Alkaline Phosphatase: 247 U/L — ABNORMAL HIGH (ref 38–126)
Anion gap: 8 (ref 5–15)
BUN: 19 mg/dL (ref 6–20)
CO2: 27 mmol/L (ref 22–32)
Calcium: 8.2 mg/dL — ABNORMAL LOW (ref 8.9–10.3)
Chloride: 104 mmol/L (ref 98–111)
Creatinine, Ser: 1.63 mg/dL — ABNORMAL HIGH (ref 0.61–1.24)
GFR, Estimated: 53 mL/min — ABNORMAL LOW (ref >60)
Glucose, Bld: 167 mg/dL — ABNORMAL HIGH (ref 70–99)
Potassium: 3.3 mmol/L — ABNORMAL LOW (ref 3.5–5.1)
Sodium: 139 mmol/L (ref 135–145)
Total Bilirubin: 0.6 mg/dL (ref 0.3–1.2)
Total Protein: 5.3 g/dL — ABNORMAL LOW (ref 6.5–8.1)

## 2020-06-11 LAB — CBC
HCT: 26.7 % — ABNORMAL LOW (ref 39.0–52.0)
Hemoglobin: 8.9 g/dL — ABNORMAL LOW (ref 13.0–17.0)
MCH: 29 pg (ref 26.0–34.0)
MCHC: 33.3 g/dL (ref 30.0–36.0)
MCV: 87 fL (ref 80.0–100.0)
Platelets: 385 10*3/uL (ref 150–400)
RBC: 3.07 MIL/uL — ABNORMAL LOW (ref 4.22–5.81)
RDW: 13.5 % (ref 11.5–15.5)
WBC: 9.4 10*3/uL (ref 4.0–10.5)
nRBC: 0 % (ref 0.0–0.2)

## 2020-06-11 LAB — GLUCOSE, CAPILLARY
Glucose-Capillary: 150 mg/dL — ABNORMAL HIGH (ref 70–99)
Glucose-Capillary: 172 mg/dL — ABNORMAL HIGH (ref 70–99)
Glucose-Capillary: 106 mg/dL — ABNORMAL HIGH (ref 70–99)
Glucose-Capillary: 189 mg/dL — ABNORMAL HIGH (ref 70–99)
Glucose-Capillary: 172 mg/dL — ABNORMAL HIGH (ref 70–99)

## 2020-06-11 LAB — MAGNESIUM: Magnesium: 1.8 mg/dL (ref 1.7–2.4)

## 2020-06-11 LAB — TSH: TSH: 1.398 u[IU]/mL (ref 0.350–4.500)

## 2020-06-11 LAB — PROTIME-INR
INR: 1.5 — ABNORMAL HIGH (ref 0.8–1.2)
Prothrombin Time: 17.5 seconds — ABNORMAL HIGH (ref 11.4–15.2)

## 2020-06-11 LAB — TROPONIN I (HIGH SENSITIVITY): Troponin I (High Sensitivity): 38 ng/L — ABNORMAL HIGH (ref <18)

## 2020-06-11 LAB — HIV ANTIBODY (ROUTINE TESTING W REFLEX): HIV Screen 4th Generation wRfx: NONREACTIVE

## 2020-06-11 LAB — HEMOGLOBIN A1C
Hgb A1c MFr Bld: 10.7 % — ABNORMAL HIGH (ref 4.8–5.6)
Mean Plasma Glucose: 260.39 mg/dL

## 2020-06-11 LAB — HEPATITIS B SURFACE ANTIGEN: Hepatitis B Surface Ag: NONREACTIVE

## 2020-06-11 LAB — HEPATITIS B CORE ANTIBODY, TOTAL: Hep B Core Total Ab: NONREACTIVE

## 2020-06-11 MED ORDER — CLONIDINE HCL 0.1 MG PO TABS
0.1000 mg | ORAL_TABLET | Freq: Two times a day (BID) | ORAL | Status: DC
  Administered 2020-06-11 – 2020-06-13 (×5): 0.1 mg via ORAL
  Filled 2020-06-11 (×5): qty 1

## 2020-06-11 MED ORDER — FUROSEMIDE 10 MG/ML IJ SOLN
40.0000 mg | Freq: Two times a day (BID) | INTRAMUSCULAR | Status: DC
  Administered 2020-06-11 – 2020-06-13 (×5): 40 mg via INTRAVENOUS
  Filled 2020-06-11 (×5): qty 4

## 2020-06-11 MED ORDER — FUROSEMIDE 10 MG/ML IJ SOLN: 40.0000 mg | Freq: Once | INTRAMUSCULAR | Status: DC

## 2020-06-11 MED ORDER — ACETAMINOPHEN 325 MG PO TABS
650.0000 mg | ORAL_TABLET | Freq: Four times a day (QID) | ORAL | Status: DC | PRN
  Administered 2020-06-11 – 2020-06-13 (×3): 650 mg via ORAL
  Filled 2020-06-11 (×3): qty 2

## 2020-06-11 NOTE — Progress Notes (Addendum)
Pt is complainaing of a dull/aching headache 3/10. OUma NP notified. Will continue to monitor.  Update 0601: Ouma NP ordered acetaminophen (TYLENOL) 650 mg oral every 6 hours for headache. Will continue to monitor.

## 2020-06-11 NOTE — Progress Notes (Signed)
*  PRELIMINARY RESULTS* Echocardiogram 2D Echocardiogram has been performed.  Bradley Lopez 06/11/2020, 3:32 PM

## 2020-06-11 NOTE — Progress Notes (Signed)
1        [place] [facility] [place] NAME: Bradley Lopez    MR#:  [id number]  DATE OF BIRTH:  18-Jan-1976  SUBJECTIVE:  CHIEF COMPLAINT:   Chief Complaint  Patient presents with  . Groin Swelling  . Blurred Vision  No nausea or vomiting while here but having headache and blurry vision.  REVIEW OF SYSTEMS:  Review of Systems  Constitutional: Negative for diaphoresis, fever, malaise/fatigue and weight loss.  HENT: Negative for ear discharge, ear pain, hearing loss, nosebleeds, sore throat and tinnitus.   Eyes: Positive for blurred vision. Negative for pain.  Respiratory: Negative for cough, hemoptysis, shortness of breath and wheezing.   Cardiovascular: Negative for chest pain, palpitations, orthopnea and leg swelling.  Gastrointestinal: Positive for nausea and vomiting. Negative for abdominal pain, blood in stool, constipation, diarrhea and heartburn.  Genitourinary: Negative for dysuria, frequency and urgency.  Musculoskeletal: Negative for back pain and myalgias.  Skin: Negative for itching and rash.  Neurological: Positive for headaches. Negative for dizziness, tingling, tremors, focal weakness, seizures and weakness.  Psychiatric/Behavioral: Negative for depression. The patient is not nervous/anxious.    DRUG ALLERGIES:   Allergies  Allergen Reactions  . Pine Hives and Itching   VITALS:  Blood pressure (!) 178/102, pulse 88, temperature 97.8 F (36.6 C), resp. rate 19, height '6\' 3"'$  (1.905 m), weight 104.5 kg, SpO2 100 %. PHYSICAL EXAMINATION:  Physical Exam HENT:     Head: Normocephalic and atraumatic.  Eyes:     Extraocular Movements: EOM normal.     Conjunctiva/sclera: Conjunctivae normal.     Pupils: Pupils are equal, round, and reactive to light.  Neck:     Thyroid: No thyromegaly.     Trachea: No tracheal deviation.  Cardiovascular:     Rate and Rhythm: Normal rate and regular rhythm.     Heart sounds: Normal heart sounds.  Pulmonary:      Effort: Pulmonary effort is normal. No respiratory distress.     Breath sounds: Normal breath sounds. No wheezing.  Chest:     Chest wall: No tenderness.  Abdominal:     General: Bowel sounds are normal. There is no distension.     Palpations: Abdomen is soft.     Tenderness: There is no abdominal tenderness.  Musculoskeletal:        General: Normal range of motion.     Cervical back: Normal range of motion and neck supple.  Skin:    General: Skin is warm and dry.     Findings: No rash.  Neurological:     Mental Status: He is alert and oriented to person, place, and time.     Cranial Nerves: No cranial nerve deficit.    LABORATORY PANEL:  Male CBC Recent Labs  Lab 06/11/20 0511  WBC 9.4  HGB 8.9*  HCT 26.7*  PLT 385   ------------------------------------------------------------------------------------------------------------------ Chemistries  Recent Labs  Lab 06/11/20 0511  NA 139  K 3.3*  CL 104  CO2 27  GLUCOSE 167*  BUN 19  CREATININE 1.63*  CALCIUM 8.2*  MG 1.8  AST 27  ALT 21  ALKPHOS 247*  BILITOT 0.6   RADIOLOGY:  MR ANGIO HEAD WO CONTRAST  Result Date: 06/11/2020 CLINICAL DATA:  Acute blurry vision, nausea, and vomiting. Hypertension. EXAM: MRA HEAD WITHOUT CONTRAST TECHNIQUE: Angiographic images of the Circle of Willis were obtained using MRA technique without intravenous contrast. COMPARISON:  None. FINDINGS: The visualized distal vertebral arteries are widely  patent to the basilar and codominant. The basilar artery is widely patent. Patent AICAs and SCAs are visible bilaterally. There are large posterior communicating arteries bilaterally. Both PCAs are patent without evidence of a significant proximal stenosis. The internal carotid arteries are widely patent from skull base to carotid termini. ACAs and MCAs are patent without evidence of a proximal branch occlusion or significant proximal stenosis. No aneurysm is identified. IMPRESSION: Negative head  MRA. Electronically Signed   By: Logan Bores M.D.   On: 06/11/2020 09:39   US Carotid Bilateral  Result Date: 06/11/2020 CLINICAL DATA:  45 year old male with blurred vision. EXAM: BILATERAL CAROTID DUPLEX ULTRASOUND TECHNIQUE: Pearline Cables scale imaging, color Doppler and duplex ultrasound were performed of bilateral carotid and vertebral arteries in the neck. COMPARISON:  None. FINDINGS: Criteria: Quantification of carotid stenosis is based on velocity parameters that correlate the residual internal carotid diameter with NASCET-based stenosis levels, using the diameter of the distal internal carotid lumen as the denominator for stenosis measurement. The following velocity measurements were obtained: RIGHT ICA: Peak systolic velocity 90 cm/sec, End diastolic velocity 36 cm/sec CCA: Peak systolic velocity 123XX123 cm/sec SYSTOLIC ICA/CCA RATIO:  0.9 ECA: Peak systolic velocity  cm/sec LEFT ICA: Peak systolic velocity 3 78 cm/sec, End diastolic velocity 30 cm/sec CCA: [id number] cm/sec SYSTOLIC ICA/CCA RATIO:  0.8 ECA: 81 cm/sec RIGHT CAROTID ARTERY: No atherosclerotic plaque formation. No significant tortuosity. Normal low resistance waveforms. RIGHT VERTEBRAL ARTERY:  Antegrade flow. LEFT CAROTID ARTERY: No atherosclerotic plaque formation. No significant tortuosity. Normal low resistance waveforms. LEFT VERTEBRAL ARTERY:  Antegrade flow. Upper extremity non-invasive blood pressures: Not obtained. IMPRESSION: 1. Right carotid artery system: Patent without significant atherosclerotic plaque formation. 2. Left carotid artery system: Patent without significant atherosclerotic plaque formation. 3.  Vertebral artery system: Patent with antegrade flow bilaterally. Ruthann Cancer, MD [facility] Electronically Signed   By: Ruthann Cancer MD   On: 06/11/2020 10:44   ASSESSMENT AND PLAN:  45 y.o. male with medical history significant for poorly controlled hypertension, type 2  diabetes mellitus, history of bilateral lower extremity DVT in 2021 chronically anticoagulated on Xarelto, stage IIIa chronic kidney disease with baseline creatinine 1.7-1.9 admitted for hypertensive emergency after presenting from home to [facility] ED complaining of nausea/vomiting   Hypertensive emergency Blood pressure on admission was 252/139 with a MAP of 177 with evidence of endorgan damage with mild elevation of transaminases And symptoms of blurry vision, headache, nausea and vomiting This is likely result of longstanding chronically uncontrolled hypertension and noncompliance Continue lisinopril, Lasix 40 mg IV twice daily, start clonidine 0.1 mg p.o. twice daily and amlodipine 5 mg once daily MRI of the brain negative for any acute pathology Carotid Dopplers shows no hemodynamically significant stenosis Echo pending Evaluate for underlying cardiomyopathy  CKD 3A Consult nephrology as patient missed outpatient appointment UA shows greater than 300 protein as well as greater than 50 RBCs Likely from underlying longstanding uncontrolled hypertension and uncontrolled diabetes  Uncontrolled diabetes Hemoglobin A1c 10.7 suggestive of poorly controlled diabetes Sliding scale insulin for now  Hypomagnesemia Replete and recheck  History of DVT On Xarelto    Body mass index is 28.79 kg/m.  Net IO Since Admission: 718 mL [06/11/20 1511]      Status is: Inpatient  Remains inpatient appropriate because:Ongoing diagnostic testing needed not appropriate for outpatient work up   Dispo: The patient is from: Home              Anticipated  d/c is to: Home              Anticipated d/c date is: 2 days              Patient currently is not medically stable to d/c.   Difficult to place patient No    DVT prophylaxis:        rivaroxaban (XARELTO) tablet 20 mg     Family Communication: Updated patient's father and PCP over phone   All the records are reviewed and case discussed  with Care Management/Social Worker. Management plans discussed with the patient, family and they are in agreement.  CODE STATUS: No Order Level of care: Progressive Cardiac  TOTAL TIME TAKING CARE OF THIS PATIENT: 35 minutes.   More than 50% of the time was spent in counseling/coordination of care: YES  POSSIBLE D/C IN 1-2 DAYS, DEPENDING ON CLINICAL CONDITION.   Max Sane M.D on 06/11/2020 at 3:11 PM  [facility]   CC: Primary care physician; Baxter Hire, MD  Note: This dictation was prepared with Dragon dictation along with smaller phrase technology. Any transcriptional errors that result from this process are unintentional.

## 2020-06-11 NOTE — Consult Note (Signed)
[address] [place], [place] [address] Phone [phone]. Fax [phone]  Date: 06/11/2020                  Patient Name:  Bradley Lopez  MRN: [id number]  DOB: [birth year]  Age / Sex: 45 y.o., male         PCP: Baxter Hire, MD                 Service Requesting Consult: IM/ Max Sane, MD                 Reason for Consult: ARF            History of Present Illness: Patient is a 45 y.o. male was admitted to [facility] on 06/10/2020    Presented for legs and groin swelling since November Came in with blurred vision Had severe HTN Missed outpatient Nephrology appointment  Patient reports that he has known about diabetes for the past 20 years.  He reports that it has been poorly controlled as he has not had a steady primary care physician.  He has been moving in between cities, has not had a chance to establish care.  He has been out of his blood pressure medications since August.  He states that his last eye exam was early 2020.  He was diagnosed with diabetic retinopathy.  He is suspicious that he may have sleep apnea as he snores while sleeping. He has significant history of nonsteroidal use ibuprofen and Aleve in the past Denies smoking or drinking alcohol Currently works as physical therapy teacher   Medications: Outpatient medications: Medications Prior to Admission  Medication Sig Dispense Refill Last Dose  . atorvastatin (LIPITOR) 40 MG tablet Take 40 mg by mouth at bedtime.   06/09/2020 at 1830  . furosemide (LASIX) 40 MG tablet Take 40 mg by mouth daily.   06/09/2020 at 0800  . glipiZIDE (GLUCOTROL XL) 2.5 MG 24 hr tablet Take 2.5 mg by mouth daily.   06/09/2020 at 0800  . ibuprofen (ADVIL) 800 MG tablet Take 800 mg by mouth 3 (three) times daily as needed for moderate pain.   Unknown at PRN  . lisinopril (ZESTRIL) 20 MG tablet Take 20 mg by mouth daily.   06/09/2020 at 0800  . rivaroxaban (XARELTO) 20 MG TABS tablet Take 1 tablet (20 mg total) by mouth daily  with supper. 30 tablet 11 06/09/2020 at 1830    Current medications: Current Facility-Administered Medications  Medication Dose Route Frequency Provider Last Rate Last Admin  . 0.9 %  sodium chloride infusion   Intravenous PRN Howerter, Justin B, DO   Stopped at 06/10/20 2256  . acetaminophen (TYLENOL) tablet 650 mg  650 mg Oral Q6H PRN Lang Snow, NP   650 mg at 06/11/20 [id number]  . amLODipine (NORVASC) tablet 5 mg  5 mg Oral Daily Howerter, Justin B, DO   5 mg at 06/11/20 0953  . atorvastatin (LIPITOR) tablet 40 mg  40 mg Oral QHS Howerter, Justin B, DO   40 mg at 06/10/20 2100  . furosemide (LASIX) injection 40 mg  40 mg Intravenous BID Manuella Ghazi, Vipul, MD      . insulin aspart (novoLOG) injection 0-9 Units  0-9 Units Subcutaneous TID WC Howerter, Justin B, DO   1 Units at 06/11/20 0953  . labetalol (NORMODYNE) injection 10 mg  10 mg Intravenous Q2H PRN Howerter, Justin B, DO   10 mg at 06/10/20 1513  . lisinopril (ZESTRIL) tablet  20 mg  20 mg Oral Daily Howerter, Justin B, DO   20 mg at 06/11/20 0953  . LORazepam (ATIVAN) injection 0.5 mg  0.5 mg Intravenous Q4H PRN Howerter, Justin B, DO      . rivaroxaban (XARELTO) tablet 20 mg  20 mg Oral Q supper Howerter, Justin B, DO   20 mg at 06/10/20 2059      Allergies: Allergies  Allergen Reactions  . Pine Hives and Itching      Past Medical History: Past Medical History:  Diagnosis Date  . Asthma   . Diabetes mellitus without complication ([place])   . Hypertension      Past Surgical History: History reviewed. No pertinent surgical history.   Family History: History reviewed. No pertinent family history.   Social History: Social History   Socioeconomic History  . Marital status: Single    Spouse name: Not on file  . Number of children: Not on file  . Years of education: Not on file  . Highest education level: Not on file  Occupational History  . Not on file  Tobacco Use  . Smoking status: Never Smoker  .  Smokeless tobacco: Never Used  Substance and Sexual Activity  . Alcohol use: Not Currently  . Drug use: Not Currently  . Sexual activity: Not on file  Other Topics Concern  . Not on file  Social History Narrative  . Not on file   Social Determinants of Health   Financial Resource Strain: Not on file  Food Insecurity: Not on file  Transportation Needs: Not on file  Physical Activity: Not on file  Stress: Not on file  Social Connections: Not on file  Intimate Partner Violence: Not on file     Review of Systems: Gen: Denies any fevers or chills HEENT: No hearing problems. Poor vision CV: No chest pain but has some shortness of breath with exertion. Large amount of leg edema Resp: No cough or sputum production GI: No nausea, vomiting or diarrhea.  No blood in the stool GU : No problems with voiding.  No hematuria.  No previous known history of kidney problems MS: Ambulatory.  Denies any acute joint pain or swelling Derm:   No complaints Psych: No complaints Heme: No complaints Neuro: No complaints Endocrine: No complaints   Vital Signs: Blood pressure (!) 178/102, pulse 88, temperature 97.8 F (36.6 C), resp. rate 19, height '6\' 3"'$  (1.905 m), weight 104.5 kg, SpO2 100 %.   Intake/Output Summary (Last 24 hours) at 06/11/2020 1221 Last data filed at 06/11/2020 1133 Gross per 24 hour  Intake 718 ml  Output 0 ml  Net 718 ml    Weight trends: Filed Weights   06/10/20 1000 06/10/20 1500 06/11/20 0100  Weight: 104.3 kg 104.6 kg 104.5 kg   Physical Exam: General:  No acute distress, laying in the bed  HEENT  anicteric, moist oral mucous membrane  Pulm/lungs  normal breathing effort, lungs are clear to auscultation  CVS/Heart  regular rhythm, no rub or gallop  Abdomen:   Soft, nontender  Extremities:  ++ peripheral edema upto mid abdomen  Neurologic:  Alert, oriented, able to follow commands  Skin:  No acute rashes     Lab results: Basic Metabolic Panel: Recent Labs   Lab 06/10/20 1029 06/10/20 1815 06/11/20 0511  NA 141  --  139  K 3.8  --  3.3*  CL 105  --  104  CO2 28  --  27  GLUCOSE 182*  --  167*  BUN 19  --  19  CREATININE 1.63*  --  1.63*  CALCIUM 8.6*  --  8.2*  MG 1.8 1.5* 1.8    Liver Function Tests: Recent Labs  Lab 06/11/20 0511  AST 27  ALT 21  ALKPHOS 247*  BILITOT 0.6  PROT 5.3*  ALBUMIN 1.9*   Recent Labs  Lab 06/10/20 1029  LIPASE 19   No results for input(s): AMMONIA in the last 168 hours.  CBC: Recent Labs  Lab 06/10/20 1029 06/11/20 0511  WBC 10.7* 9.4  NEUTROABS 7.8*  --   HGB 10.2* 8.9*  HCT 30.9* 26.7*  MCV 86.1 87.0  PLT 446* 385    Cardiac Enzymes: No results for input(s): CKTOTAL, TROPONINI in the last 168 hours.  BNP: Invalid input(s): POCBNP  CBG: Recent Labs  Lab 06/10/20 1001 06/10/20 1927 06/11/20 0749 06/11/20 1132  GLUCAP 182* 158* 150* 172*    Microbiology: Recent Results (from the past 720 hour(s))  Resp Panel by RT-PCR (Flu A&B, Covid) Nasopharyngeal Swab     Status: None   Collection Time: 06/10/20 12:14 PM   Specimen: Nasopharyngeal Swab; Nasopharyngeal(NP) swabs in vial transport medium  Result Value Ref Range Status   SARS Coronavirus 2 by RT PCR NEGATIVE NEGATIVE Final    Comment: (NOTE) SARS-CoV-2 target nucleic acids are NOT DETECTED.  The SARS-CoV-2 RNA is generally detectable in upper respiratory specimens during the acute phase of infection. The lowest concentration of SARS-CoV-2 viral copies this assay can detect is 138 copies/mL. A negative result does not preclude SARS-Cov-2 infection and should not be used as the sole basis for treatment or other patient management decisions. A negative result may occur with  improper specimen collection/handling, submission of specimen other than nasopharyngeal swab, presence of viral mutation(s) within the areas targeted by this assay, and inadequate number of viral copies(<138 copies/mL). A negative result must  be combined with clinical observations, patient history, and epidemiological information. The expected result is Negative.  Fact Sheet for Patients:  EntrepreneurPulse.com.au  Fact Sheet for Healthcare Providers:  IncredibleEmployment.be  This test is no t yet approved or cleared by the [place] FDA and  has been authorized for detection and/or diagnosis of SARS-CoV-2 by FDA under an Emergency Use Authorization (EUA). This EUA will remain  in effect (meaning this test can be used) for the duration of the COVID-19 declaration under Section 564(b)(1) of the Act, 21 U.S.C.section 360bbb-3(b)(1), unless the authorization is terminated  or revoked sooner.       Influenza A by PCR NEGATIVE NEGATIVE Final   Influenza B by PCR NEGATIVE NEGATIVE Final    Comment: (NOTE) The Xpert Xpress SARS-CoV-2/FLU/RSV plus assay is intended as an aid in the diagnosis of influenza from Nasopharyngeal swab specimens and should not be used as a sole basis for treatment. Nasal washings and aspirates are unacceptable for Xpert Xpress SARS-CoV-2/FLU/RSV testing.  Fact Sheet for Patients: EntrepreneurPulse.com.au  Fact Sheet for Healthcare Providers: IncredibleEmployment.be  This test is not yet approved or cleared by the [place] FDA and has been authorized for detection and/or diagnosis of SARS-CoV-2 by FDA under an Emergency Use Authorization (EUA). This EUA will remain in effect (meaning this test can be used) for the duration of the COVID-19 declaration under Section 564(b)(1) of the Act, 21 U.S.C. section 360bbb-3(b)(1), unless the authorization is terminated or revoked.  Performed at [facility], [address] [place], [place] [address]      Coagulation Studies: Recent Labs  06/11/20 0511  LABPROT 17.5*  INR 1.5*    Urinalysis: Recent Labs    06/10/20 1411  COLORURINE YELLOW*  LABSPEC  1.010  PHURINE 7.0  GLUCOSEU 50*  HGBUR MODERATE*  BILIRUBINUR NEGATIVE  KETONESUR NEGATIVE  PROTEINUR >=300*  NITRITE NEGATIVE  LEUKOCYTESUR MODERATE*        Imaging: DG Chest 2 View  Result Date: 06/10/2020 CLINICAL DATA:  Shortness of breath. EXAM: CHEST - 2 VIEW COMPARISON:  None. FINDINGS: The heart size and mediastinal contours are within normal limits. No pneumothorax is noted. Mild bibasilar atelectasis or edema is noted with small pleural effusions. The visualized skeletal structures are unremarkable. IMPRESSION: Mild bibasilar atelectasis or edema is noted with small pleural effusions. Electronically Signed   By: Marijo Conception M.D.   On: 06/10/2020 11:24   MR ANGIO HEAD WO CONTRAST  Result Date: 06/11/2020 CLINICAL DATA:  Acute blurry vision, nausea, and vomiting. Hypertension. EXAM: MRA HEAD WITHOUT CONTRAST TECHNIQUE: Angiographic images of the Circle of Willis were obtained using MRA technique without intravenous contrast. COMPARISON:  None. FINDINGS: The visualized distal vertebral arteries are widely patent to the basilar and codominant. The basilar artery is widely patent. Patent AICAs and SCAs are visible bilaterally. There are large posterior communicating arteries bilaterally. Both PCAs are patent without evidence of a significant proximal stenosis. The internal carotid arteries are widely patent from skull base to carotid termini. ACAs and MCAs are patent without evidence of a proximal branch occlusion or significant proximal stenosis. No aneurysm is identified. IMPRESSION: Negative head MRA. Electronically Signed   By: Logan Bores M.D.   On: 06/11/2020 09:39   MR BRAIN WO CONTRAST  Result Date: 06/10/2020 CLINICAL DATA:  Blurred vision. EXAM: MRI HEAD WITHOUT CONTRAST TECHNIQUE: Multiplanar, multiecho pulse sequences of the brain and surrounding structures were obtained without intravenous contrast. COMPARISON:  Head CT 06/10/2020 FINDINGS: The study is mildly motion  degraded despite utilizing faster imaging protocols and repeat imaging attempts. Brain: There is no evidence of an acute infarct, intracranial hemorrhage, mass, midline shift, or extra-axial fluid collection. The ventricles and sulci are normal. No focal brain parenchymal signal abnormality is identified. Vascular: Major intracranial vascular flow voids are preserved. Skull and upper cervical spine: Diffusely diminished bone marrow T1 signal intensity in the included upper cervical spine, nonspecific though may be secondary to known anemia. Sinuses/Orbits: Unremarkable orbits. No evidence of significant inflammatory sinus disease. Clear mastoid air cells. Other: None. IMPRESSION: Unremarkable appearance of the brain. Electronically Signed   By: Logan Bores M.D.   On: 06/10/2020 13:16   US Carotid Bilateral  Result Date: 06/11/2020 CLINICAL DATA:  45 year old male with blurred vision. EXAM: BILATERAL CAROTID DUPLEX ULTRASOUND TECHNIQUE: Pearline Cables scale imaging, color Doppler and duplex ultrasound were performed of bilateral carotid and vertebral arteries in the neck. COMPARISON:  None. FINDINGS: Criteria: Quantification of carotid stenosis is based on velocity parameters that correlate the residual internal carotid diameter with NASCET-based stenosis levels, using the diameter of the distal internal carotid lumen as the denominator for stenosis measurement. The following velocity measurements were obtained: RIGHT ICA: Peak systolic velocity 90 cm/sec, End diastolic velocity 36 cm/sec CCA: Peak systolic velocity 123XX123 cm/sec SYSTOLIC ICA/CCA RATIO:  0.9 ECA: Peak systolic velocity  cm/sec LEFT ICA: Peak systolic velocity 3 78 cm/sec, End diastolic velocity 30 cm/sec CCA: 123XX123 cm/sec SYSTOLIC ICA/CCA RATIO:  0.8 ECA: 81 cm/sec RIGHT CAROTID ARTERY: No atherosclerotic plaque formation. No significant tortuosity. Normal low resistance waveforms. RIGHT VERTEBRAL ARTERY:  Antegrade  flow. LEFT CAROTID ARTERY: No  atherosclerotic plaque formation. No significant tortuosity. Normal low resistance waveforms. LEFT VERTEBRAL ARTERY:  Antegrade flow. Upper extremity non-invasive blood pressures: Not obtained. IMPRESSION: 1. Right carotid artery system: Patent without significant atherosclerotic plaque formation. 2. Left carotid artery system: Patent without significant atherosclerotic plaque formation. 3.  Vertebral artery system: Patent with antegrade flow bilaterally. Ruthann Cancer, MD [facility] Radiology Electronically Signed   By: Ruthann Cancer MD   On: 06/11/2020 10:44   US SCROTUM W/DOPPLER  Result Date: 06/10/2020 CLINICAL DATA:  Testicular pain and swelling EXAM: SCROTAL ULTRASOUND DOPPLER ULTRASOUND OF THE TESTICLES TECHNIQUE: Complete ultrasound examination of the testicles, epididymis, and other scrotal structures was performed. Color and spectral Doppler ultrasound were also utilized to evaluate blood flow to the testicles. COMPARISON:  None. FINDINGS: Right testicle Measurements: 4.4 x 2.9 x 2.9 cm. No mass is visualized. Scattered microlithiasis. Left testicle Measurements: 4.0 x 3.1 x 2.7 cm. No mass is visualized. Scattered microlithiasis. Right epididymis:  Normal in size and appearance. Left epididymis:  Normal in size and appearance. Hydrocele:  None visualized. Varicocele:  None visualized. Pulsed Doppler interrogation of both testes demonstrates normal low resistance arterial and venous waveforms bilaterally. Other: Marked diffuse scrotal wall edema. No organized fluid collection identified. IMPRESSION: 1. Negative for testicular torsion or intratesticular mass. 2. Marked diffuse scrotal wall edema. Findings are nonspecific and may be secondary to anasarca, as was present on the recent CT. Cellulitis could have a similar appearance in the appropriate clinical setting. No organized fluid collection was identified. 3. Bilateral microlithiasis. Current  literature suggests that testicular microlithiasis is not a significant independent risk factor for development of testicular carcinoma, and that follow up imaging is not warranted in the absence of other risk factors. Monthly testicular self-examination and annual physical exams are considered appropriate surveillance. If patient has other risk factors for testicular carcinoma, then referral to Urology should be considered. (Reference: DeCastro, et al.: A 5-Year Follow up Study of Asymptomatic Men with Testicular Microlithiasis. J Urol 2008; [id number].) Electronically Signed   By: Davina Poke D.O.   On: 06/10/2020 12:22   CT HEAD CODE STROKE WO CONTRAST`  Result Date: 06/10/2020 CLINICAL DATA:  Code stroke. Neuro deficit, acute, stroke suspected. Additional history provided: Patient reports upper leg and groin swelling since November, blurred vision since 5 a.m. EXAM: CT HEAD WITHOUT CONTRAST TECHNIQUE: Contiguous axial images were obtained from the base of the skull through the vertex without intravenous contrast. COMPARISON:  No pertinent prior exams available for comparison. FINDINGS: Brain: Cerebral volume is normal. There is no acute intracranial hemorrhage. No demarcated cortical infarct. No extra-axial fluid collection. No evidence of intracranial mass. No midline shift. Vascular: No hyperdense vessel. Skull: Normal. Negative for fracture or focal lesion. Sinuses/Orbits: Visualized orbits show no acute finding. Trace scattered paranasal sinus mucosal thickening at the imaged levels. ASPECTS ([place] Stroke Program Early CT Score) - Ganglionic level infarction (caudate, lentiform nuclei, internal capsule, insula, M1-M3 cortex): 7 - Supraganglionic infarction (M4-M6 cortex): 3 Total score (0-10 with 10 being normal): 10 These results were called by telephone at the time of interpretation on 06/10/2020 at 11:05 am to provider [facility] , who verbally acknowledged these results. IMPRESSION: No  evidence of acute intracranial abnormality.  ASPECTS is 10. Minimal paranasal sinus mucosal thickening at the imaged levels. Electronically Signed   By: Kellie Simmering DO   On: 06/10/2020 11:06      Assessment & Plan: Pt  is a 45 y.o.   male with HTN, DM-2, Asthma, DVT on Xaralto, lymphedema, was admitted on 06/10/2020 with Hypertensive crisis [I16.9] Hypertensive encephalopathy syndrome [I67.4] Scrotal edema [N50.89] Pain in testicle, unspecified laterality [N50.819] Hypertensive emergency [I16.1]     Nephrotic syndrome with proteinuria UPC 8 gm CKD stage IIIa, baseline creatinine 1.6 GFR 53 Hematuria Anasarca, hypoalbuminemia Malignant (severe) HTN Poorly Controlled DM-2 HbA1c 12.6 % with renal manifestations Hypokalemia  Imaging: CT venogram - no venous clot No hydronpehrosis  Plan: Serological screening CKD likely due to poorly controlled DM and HTN Start clonidine for additional BP control.  Eventual goal of [id number] systolic as tolerated Aldo/renin ratio Agree with ACE inhibitor, statin Volume control with IV furosemide Follow strict low-salt diet  Discussed with patient and his dad, Dr. Arta Bruce, that this appears to be nephrotic syndrome due to poorly controlled diabetes.  However we are obtaining screening serologies.  Discussed possibility of renal biopsy if any other serological results are abnormal. Further plans as hospital course progresses     LOS: 0 Damyn Weitzel 2/19/202212:21 PM    Note: This note was prepared with Dragon dictation. Any transcription errors are unintentional

## 2020-06-11 NOTE — Plan of Care (Signed)

## 2020-06-12 DIAGNOSIS — I161 Hypertensive emergency: Secondary | ICD-10-CM | POA: Diagnosis not present

## 2020-06-12 DIAGNOSIS — N1831 Chronic kidney disease, stage 3a: Secondary | ICD-10-CM | POA: Diagnosis not present

## 2020-06-12 DIAGNOSIS — E1329 Other specified diabetes mellitus with other diabetic kidney complication: Secondary | ICD-10-CM | POA: Diagnosis not present

## 2020-06-12 DIAGNOSIS — R601 Generalized edema: Secondary | ICD-10-CM | POA: Diagnosis not present

## 2020-06-12 DIAGNOSIS — E1365 Other specified diabetes mellitus with hyperglycemia: Secondary | ICD-10-CM

## 2020-06-12 LAB — ECHOCARDIOGRAM COMPLETE
AR max vel: 2.86 cm2
AV Peak grad: 3.7 mmHg
Ao pk vel: 0.96 m/s
Area-P 1/2: 4.36 cm2
Height: 75 in
S' Lateral: 3.94 cm
Weight: 3684.8 oz

## 2020-06-12 LAB — GLUCOSE, CAPILLARY
Glucose-Capillary: 139 mg/dL — ABNORMAL HIGH (ref 70–99)
Glucose-Capillary: 134 mg/dL — ABNORMAL HIGH (ref 70–99)
Glucose-Capillary: 135 mg/dL — ABNORMAL HIGH (ref 70–99)
Glucose-Capillary: 155 mg/dL — ABNORMAL HIGH (ref 70–99)

## 2020-06-12 LAB — HEPATITIS C ANTIBODY: HCV Ab: NONREACTIVE

## 2020-06-12 LAB — HEPATITIS B SURFACE ANTIBODY,QUALITATIVE: Hep B S Ab: REACTIVE — AB

## 2020-06-12 MED ORDER — METOPROLOL TARTRATE 25 MG PO TABS
12.5000 mg | ORAL_TABLET | Freq: Two times a day (BID) | ORAL | Status: DC
  Administered 2020-06-12 – 2020-06-13 (×3): 12.5 mg via ORAL
  Filled 2020-06-12 (×3): qty 1

## 2020-06-12 NOTE — Progress Notes (Signed)
1        [place] [facility] [place] NAME: Bradley Lopez    MR#:  [id number]  DATE OF BIRTH:  Mar 18, 1976  SUBJECTIVE:  CHIEF COMPLAINT:   Chief Complaint  Patient presents with  . Groin Swelling  . Blurred Vision  Blurry vision, blood pressure improved but still remains high.  Agreeable to stay for getting blood pressure under better control and complete the work-up REVIEW OF SYSTEMS:  Review of Systems  Constitutional: Negative for diaphoresis, fever, malaise/fatigue and weight loss.  HENT: Negative for ear discharge, ear pain, hearing loss, nosebleeds, sore throat and tinnitus.   Eyes: Positive for blurred vision. Negative for pain.  Respiratory: Negative for cough, hemoptysis, shortness of breath and wheezing.   Cardiovascular: Negative for chest pain, palpitations, orthopnea and leg swelling.  Gastrointestinal: Negative for abdominal pain, blood in stool, constipation, diarrhea and heartburn.  Genitourinary: Negative for dysuria, frequency and urgency.  Musculoskeletal: Negative for back pain and myalgias.  Skin: Negative for itching and rash.  Neurological: Negative for dizziness, tingling, tremors, focal weakness, seizures and weakness.  Psychiatric/Behavioral: Negative for depression. The patient is not nervous/anxious.    DRUG ALLERGIES:   Allergies  Allergen Reactions  . Pine Hives and Itching   VITALS:  Blood pressure (!) 178/106, pulse 79, temperature (!) 97.5 F (36.4 C), temperature source Oral, resp. rate 17, height '6\' 3"'$  (1.905 m), weight 104.8 kg, SpO2 100 %. PHYSICAL EXAMINATION:  Physical Exam HENT:     Head: Normocephalic and atraumatic.  Eyes:     Conjunctiva/sclera: Conjunctivae normal.     Pupils: Pupils are equal, round, and reactive to light.  Neck:     Thyroid: No thyromegaly.     Trachea: No tracheal deviation.  Cardiovascular:     Rate and Rhythm: Normal rate and regular rhythm.     Heart sounds: Normal heart sounds.   Pulmonary:     Effort: Pulmonary effort is normal. No respiratory distress.     Breath sounds: Normal breath sounds. No wheezing.  Chest:     Chest wall: No tenderness.  Abdominal:     General: Bowel sounds are normal. There is no distension.     Palpations: Abdomen is soft.     Tenderness: There is no abdominal tenderness.  Musculoskeletal:        General: Normal range of motion.     Cervical back: Normal range of motion and neck supple.  Skin:    General: Skin is warm and dry.     Findings: No rash.  Neurological:     Mental Status: He is alert and oriented to person, place, and time.     Cranial Nerves: No cranial nerve deficit.    LABORATORY PANEL:  Male CBC Recent Labs  Lab 06/11/20 0511  WBC 9.4  HGB 8.9*  HCT 26.7*  PLT 385   ------------------------------------------------------------------------------------------------------------------ Chemistries  Recent Labs  Lab 06/11/20 0511  NA 139  K 3.3*  CL 104  CO2 27  GLUCOSE 167*  BUN 19  CREATININE 1.63*  CALCIUM 8.2*  MG 1.8  AST 27  ALT 21  ALKPHOS 247*  BILITOT 0.6   RADIOLOGY:  No results found. ASSESSMENT AND PLAN:  45 y.o. male with medical history significant for poorly controlled hypertension, type 2 diabetes mellitus, history of bilateral lower extremity DVT in 2021 chronically anticoagulated on Xarelto, stage IIIa chronic kidney disease with baseline creatinine 1.7-1.9 admitted for hypertensive emergency after presenting from home to  [facility] ED complaining of nausea/vomiting   Hypertensive emergency Blood pressure on admission was 252/139 with a MAP of 177 with evidence of endorgan damage with mild elevation of transaminases And symptoms of blurry vision, headache, nausea and vomiting This is likely result of longstanding chronically uncontrolled hypertension and noncompliance Continue lisinopril, Lasix 40 mg IV twice daily, clonidine 0.1 mg p.o. twice daily and amlodipine 5 mg once  daily Starting metoprolol 12.5 mg p.o. twice daily MRI of the brain negative for any acute pathology Carotid Dopplers shows no hemodynamically significant stenosis Echo shows normal LV systolic function but grade 2 diastolic dysfunction  CKD 3A Nephrotic syndrome with proteinuria Appreciate nephrology input.   Likely from underlying longstanding uncontrolled hypertension and uncontrolled diabetes  Uncontrolled diabetes Hemoglobin A1c 10.7 suggestive of poorly controlled diabetes Sliding scale insulin for now  Hypomagnesemia Repleted  History of DVT On Xarelto    Body mass index is 28.87 kg/m.  Net IO Since Admission: 1,167.94 mL [06/12/20 1801]      Status is: Inpatient  Remains inpatient appropriate because:Ongoing diagnostic testing needed not appropriate for outpatient work up   Dispo: The patient is from: Home              Anticipated d/c is to: Home              Anticipated d/c date is: 2 days              Patient currently is not medically stable to d/c.  Blood pressure continues to stay high   Difficult to place patient No    DVT prophylaxis:        rivaroxaban (XARELTO) tablet 20 mg     Family Communication: Updated patient's father on 2/20   All the records are reviewed and case discussed with Care Management/Social Worker. Management plans discussed with the patient, family and they are in agreement.  CODE STATUS: No Order Level of care: Progressive Cardiac  TOTAL TIME TAKING CARE OF THIS PATIENT: 35 minutes.   More than 50% of the time was spent in counseling/coordination of care: YES  POSSIBLE D/C IN 1-2 DAYS, DEPENDING ON CLINICAL CONDITION.   Max Sane M.D on 06/12/2020 at 6:01 PM  [facility]   CC: Primary care physician; Baxter Hire, MD  Note: This dictation was prepared with Dragon dictation along with smaller phrase technology. Any transcriptional errors that result from this process are unintentional.

## 2020-06-12 NOTE — Progress Notes (Signed)
[place], [place] 06/12/20  Subjective:  Bradley Lopez is a 45 y.o. is a african [place] male with PMHX of type 2 diabetes, poorly controlled hypertension. 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 He presented to ED with nausea and vomiting.   He was admitted with hypertensive emergency.  Patient seen laying in bed Alert and oriented States he is feeling well today Denies shortness of breath and chest pain Able to eat meals Denies nausea   Objective:  Vital signs in last 24 hours:  Temp:  [97.6 F (36.4 C)-98.6 F (37 C)] 98 F (36.7 C) (02/20 1157) Pulse Rate:  [70-86] 70 (02/20 1157) Resp:  [17-19] 18 (02/20 1157) BP: (149-181)/(93-106) 149/101 (02/20 1157) SpO2:  [95 %-100 %] 100 % (02/20 1157) Weight:  [104.8 kg] 104.8 kg (02/20 0016)  Weight change: 0.454 kg Filed Weights   06/10/20 1500 06/11/20 0100 06/12/20 0016  Weight: 104.6 kg 104.5 kg 104.8 kg    Intake/Output:    Intake/Output Summary (Last 24 hours) at 06/12/2020 1333 Last data filed at 06/11/2020 2200 Gross per 24 hour  Intake 209.94 ml  Output 0 ml  Net 209.94 ml    Physical Exam: General:  No acute distress, laying in the bed  HEENT  anicteric, moist oral mucous membrane  Pulm/lungs  normal breathing effort, lungs are clear to auscultation  CVS/Heart  regular rhythm and rate  Abdomen:   Soft, nontender  Extremities:  ++ peripheral edema upto mid abdomen  Neurologic:  Alert, oriented, able to follow commands  Skin:  No acute rashes     Basic Metabolic Panel:  Recent Labs  Lab 06/10/20 1029 06/10/20 1815 06/11/20 0511  NA 141  --  139  K 3.8  --  3.3*  CL 105  --  104  CO2 28  --  27  GLUCOSE 182*  --  167*  BUN 19  --  19  CREATININE 1.63*  --  1.63*  CALCIUM 8.6*  --  8.2*  MG 1.8 1.5* 1.8     CBC: Recent Labs  Lab 06/10/20 1029 06/11/20 0511  WBC 10.7* 9.4  NEUTROABS 7.8*  --    HGB 10.2* 8.9*  HCT 30.9* 26.7*  MCV 86.1 87.0  PLT 446* 385      Lab Results  Component Value Date   HEPBSAG NON REACTIVE 06/11/2020      Microbiology:  Recent Results (from the past 240 hour(s))  Resp Panel by RT-PCR (Flu A&B, Covid) Nasopharyngeal Swab     Status: None   Collection Time: 06/10/20 12:14 PM   Specimen: Nasopharyngeal Swab; Nasopharyngeal(NP) swabs in vial transport medium  Result Value Ref Range Status   SARS Coronavirus 2 by RT PCR NEGATIVE NEGATIVE Final    Comment: (NOTE) SARS-CoV-2 target nucleic acids are NOT DETECTED.  The SARS-CoV-2 RNA is generally detectable in upper respiratory specimens during the acute phase of infection. The lowest concentration of SARS-CoV-2 viral copies this assay can detect is 138 copies/mL. A negative result does not preclude SARS-Cov-2 infection and should not be used as the sole basis for treatment or other patient management decisions. A negative result may occur with  improper specimen collection/handling, submission of specimen other than nasopharyngeal swab, presence of viral mutation(s) within the areas targeted by this assay, and inadequate number of viral copies(<138 copies/mL). A negative result must be combined with clinical observations, patient history, and epidemiological information. The expected result is  Negative.  Fact Sheet for Patients:  EntrepreneurPulse.com.au  Fact Sheet for Healthcare Providers:  IncredibleEmployment.be  This test is no t yet approved or cleared by the [place] FDA and  has been authorized for detection and/or diagnosis of SARS-CoV-2 by FDA under an Emergency Use Authorization (EUA). This EUA will remain  in effect (meaning this test can be used) for the duration of the COVID-19 declaration under Section 564(b)(1) of the Act, 21 U.S.C.section 360bbb-3(b)(1), unless the authorization is terminated  or revoked sooner.        Influenza A by PCR NEGATIVE NEGATIVE Final   Influenza B by PCR NEGATIVE NEGATIVE Final    Comment: (NOTE) The Xpert Xpress SARS-CoV-2/FLU/RSV plus assay is intended as an aid in the diagnosis of influenza from Nasopharyngeal swab specimens and should not be used as a sole basis for treatment. Nasal washings and aspirates are unacceptable for Xpert Xpress SARS-CoV-2/FLU/RSV testing.  Fact Sheet for Patients: EntrepreneurPulse.com.au  Fact Sheet for Healthcare Providers: IncredibleEmployment.be  This test is not yet approved or cleared by the [place] FDA and has been authorized for detection and/or diagnosis of SARS-CoV-2 by FDA under an Emergency Use Authorization (EUA). This EUA will remain in effect (meaning this test can be used) for the duration of the COVID-19 declaration under Section 564(b)(1) of the Act, 21 U.S.C. section 360bbb-3(b)(1), unless the authorization is terminated or revoked.  Performed at [facility], [address] [place], [place] [address]     Coagulation Studies: Recent Labs    06/11/20 0511  LABPROT 17.5*  INR 1.5*    Urinalysis: Recent Labs    06/10/20 1411  COLORURINE YELLOW*  LABSPEC 1.010  PHURINE 7.0  GLUCOSEU 50*  HGBUR MODERATE*  BILIRUBINUR NEGATIVE  KETONESUR NEGATIVE  PROTEINUR >=300*  NITRITE NEGATIVE  LEUKOCYTESUR MODERATE*      Imaging: MR ANGIO HEAD WO CONTRAST  Result Date: 06/11/2020 CLINICAL DATA:  Acute blurry vision, nausea, and vomiting. Hypertension. EXAM: MRA HEAD WITHOUT CONTRAST TECHNIQUE: Angiographic images of the Circle of Willis were obtained using MRA technique without intravenous contrast. COMPARISON:  None. FINDINGS: The visualized distal vertebral arteries are widely patent to the basilar and codominant. The basilar artery is widely patent. Patent AICAs and SCAs are visible bilaterally. There are large posterior communicating arteries bilaterally. Both  PCAs are patent without evidence of a significant proximal stenosis. The internal carotid arteries are widely patent from skull base to carotid termini. ACAs and MCAs are patent without evidence of a proximal branch occlusion or significant proximal stenosis. No aneurysm is identified. IMPRESSION: Negative head MRA. Electronically Signed   By: Logan Bores M.D.   On: 06/11/2020 09:39   US Carotid Bilateral  Result Date: 06/11/2020 CLINICAL DATA:  44 year old male with blurred vision. EXAM: BILATERAL CAROTID DUPLEX ULTRASOUND TECHNIQUE: Pearline Cables scale imaging, color Doppler and duplex ultrasound were performed of bilateral carotid and vertebral arteries in the neck. COMPARISON:  None. FINDINGS: Criteria: Quantification of carotid stenosis is based on velocity parameters that correlate the residual internal carotid diameter with NASCET-based stenosis levels, using the diameter of the distal internal carotid lumen as the denominator for stenosis measurement. The following velocity measurements were obtained: RIGHT ICA: Peak systolic velocity 90 cm/sec, End diastolic velocity 36 cm/sec CCA: Peak systolic velocity 123XX123 cm/sec SYSTOLIC ICA/CCA RATIO:  0.9 ECA: Peak systolic velocity  cm/sec LEFT ICA: Peak systolic velocity 3 78 cm/sec, End diastolic velocity 30 cm/sec CCA: 123XX123 cm/sec SYSTOLIC ICA/CCA RATIO:  0.8 ECA: 81 cm/sec RIGHT  CAROTID ARTERY: No atherosclerotic plaque formation. No significant tortuosity. Normal low resistance waveforms. RIGHT VERTEBRAL ARTERY:  Antegrade flow. LEFT CAROTID ARTERY: No atherosclerotic plaque formation. No significant tortuosity. Normal low resistance waveforms. LEFT VERTEBRAL ARTERY:  Antegrade flow. Upper extremity non-invasive blood pressures: Not obtained. IMPRESSION: 1. Right carotid artery system: Patent without significant atherosclerotic plaque formation. 2. Left carotid artery system: Patent without significant atherosclerotic plaque formation. 3.  Vertebral artery system:  Patent with antegrade flow bilaterally. Ruthann Cancer, MD [facility] Radiology Electronically Signed   By: Ruthann Cancer MD   On: 06/11/2020 10:44   ECHOCARDIOGRAM COMPLETE  Result Date: 06/12/2020    ECHOCARDIOGRAM REPORT   Patient Name:   Bradley Lopez Date of Exam: 06/11/2020 Medical Rec #:  [id number]   Height:       75.0 in Accession #:    [id number]  Weight:       230.3 lb Date of Birth:  October 26, 1975   BSA:          2.330 m Patient Age:    39 years    BP:           186/104 mmHg Patient Gender: M           HR:           84 bpm. Exam Location:  [facility] Procedure: 2D Echo, Cardiac Doppler and Color Doppler Indications:     Anasarca [id number]  History:         Patient has no prior history of Echocardiogram examinations.                  Risk Factors:Hypertension and Diabetes.  Sonographer:     [name] Referring Phys:  [id number] [name] Diagnosing Phys: Serafina Royals MD IMPRESSIONS  1. Left ventricular ejection fraction, by estimation, is 60 to 65%. The left ventricle has normal function. The left ventricle has no regional wall motion abnormalities. There is moderate left ventricular hypertrophy. Left ventricular diastolic parameters are consistent with Grade II diastolic dysfunction (pseudonormalization).  2. Right ventricular systolic function is normal. The right ventricular size is normal.  3. Left atrial size was mildly dilated.  4. Moderate pleural effusion in the left lateral region.  5. The mitral valve is normal in structure. Mild to moderate mitral valve regurgitation.  6. The aortic valve is normal in structure. Aortic valve regurgitation is trivial. FINDINGS  Left Ventricle: Left ventricular ejection fraction, by estimation, is 60 to 65%. The left ventricle has normal function. The left ventricle has no regional wall motion abnormalities. The left ventricular internal cavity size was normal in size. There is  moderate left ventricular  hypertrophy. Left ventricular diastolic parameters are consistent with Grade II diastolic dysfunction (pseudonormalization). Right Ventricle: The right ventricular size is normal. No increase in right ventricular wall thickness. Right ventricular systolic function is normal. Left Atrium: Left atrial size was mildly dilated. Right Atrium: Right atrial size was normal in size. Pericardium: There is no evidence of pericardial effusion. Mitral Valve: The mitral valve is normal in structure. Mild to moderate mitral valve regurgitation. Tricuspid Valve: The tricuspid valve is normal in structure. Tricuspid valve regurgitation is mild. Aortic Valve: The aortic valve is normal in structure. Aortic valve regurgitation is trivial. Aortic valve peak gradient measures 3.7 mmHg. Pulmonic Valve: The pulmonic valve was normal in structure. Pulmonic valve regurgitation is not visualized. Aorta: The aortic root and ascending aorta are structurally normal, with no evidence of dilitation. IAS/Shunts:  No atrial level shunt detected by color flow Doppler. Additional Comments: There is a moderate pleural effusion in the left lateral region.  LEFT VENTRICLE PLAX 2D LVIDd:         5.04 cm  Diastology LVIDs:         3.94 cm  LV e' medial:    7.18 cm/s LV PW:         1.23 cm  LV E/e' medial:  16.0 LV IVS:        1.32 cm  LV e' lateral:   6.64 cm/s LVOT diam:     2.00 cm  LV E/e' lateral: 17.3 LVOT Area:     3.14 cm  RIGHT VENTRICLE RV S prime:     14.50 cm/s TAPSE (M-mode): 2.2 cm LEFT ATRIUM             Index       RIGHT ATRIUM           Index LA diam:        4.20 cm 1.80 cm/m  RA Area:     16.20 cm LA Vol (A2C):   60.1 ml 25.79 ml/m RA Volume:   40.70 ml  17.47 ml/m LA Vol (A4C):   51.7 ml 22.19 ml/m LA Biplane Vol: 57.1 ml 24.51 ml/m  AORTIC VALVE               PULMONIC VALVE AV Area (Vmax): 2.86 cm   PV Vmax:        0.98 m/s AV Vmax:        96.10 cm/s PV Peak grad:   3.8 mmHg AV Peak Grad:   3.7 mmHg   RVOT Peak grad: 3 mmHg LVOT  Vmax:      87.40 cm/s  AORTA Ao Root diam: 2.90 cm MITRAL VALVE MV Area (PHT): 4.36 cm     SHUNTS MV Decel Time: 174 msec     Systemic Diam: 2.00 cm MV E velocity: 115.00 cm/s MV A velocity: 84.80 cm/s MV E/A ratio:  1.36 MV A Prime:    9.7 cm/s Serafina Royals MD Electronically signed by Serafina Royals MD Signature Date/Time: 06/12/2020/6:02:23 AM    Final      Medications:   . sodium chloride Stopped (06/10/20 2256)   . amLODipine  5 mg Oral Daily  . atorvastatin  40 mg Oral QHS  . cloNIDine  0.1 mg Oral BID  . furosemide  40 mg Intravenous BID  . insulin aspart  0-9 Units Subcutaneous TID WC  . lisinopril  20 mg Oral Daily  . metoprolol tartrate  12.5 mg Oral BID  . rivaroxaban  20 mg Oral Q supper   sodium chloride, acetaminophen, labetalol, LORazepam  Assessment/ Plan:  45 y.o. male with  was admitted on 06/10/2020 for  Principal Problem:   Hypertensive emergency Active Problems:   Mixed hyperlipidemia   Type 2 diabetes mellitus without complication, without long-term current use of insulin (HCC)   Nausea & vomiting   Anasarca   Elevated troponin   Hypomagnesemia   CKD (chronic kidney disease) stage 3, GFR 30-59 ml/min (HCC)  Hypertensive crisis [I16.9] Hypertensive encephalopathy syndrome [I67.4] Scrotal edema [N50.89] Pain in testicle, unspecified laterality [N50.819] Hypertensive emergency [I16.1]  #.Nephrotic syndrome with proteinuria UPC 8 gm -likely due to poorly controlled diabetes -Clonidine started for BP control along with statins -BP remains elevated -Start Metoprolol for improved control   #. Anemia of Chronic kidney disease  Lab Results  Component Value Date  HGB 8.9 (L) 06/11/2020  Decreased since admission Will monitor this level.   #. Secondary hyperparathyroidism of renal origin   No results found for: PTH No results found for: PHOS Monitor calcium and phos level during this admission   #. Diabetes type 2 with CKD Hgb A1c MFr Bld (%)   Date Value  06/11/2020 10.7 (H)  Stable glucose levels   LOS: [place] 2/20/20221:33 PM  [facility] [place], [place]

## 2020-06-12 NOTE — Plan of Care (Signed)
  Problem: Education: Goal: Knowledge of General Education information will improve Description: Including pain rating scale, medication(s)/side effects and non-pharmacologic comfort measures Outcome: Progressing   Problem: Clinical Measurements: Goal: Cardiovascular complication will be avoided Outcome: Progressing   Problem: Nutrition: Goal: Adequate nutrition will be maintained Outcome: Progressing   Problem: Pain Managment: Goal: General experience of comfort will improve Outcome: Progressing   Problem: Safety: Goal: Ability to remain free from injury will improve Outcome: Progressing   

## 2020-06-13 ENCOUNTER — Other Ambulatory Visit: Payer: Self-pay

## 2020-06-13 DIAGNOSIS — Z20822 Contact with and (suspected) exposure to covid-19: Secondary | ICD-10-CM | POA: Diagnosis present

## 2020-06-13 DIAGNOSIS — I161 Hypertensive emergency: Principal | ICD-10-CM | POA: Diagnosis present

## 2020-06-13 DIAGNOSIS — Z8672 Personal history of thrombophlebitis: Secondary | ICD-10-CM

## 2020-06-13 DIAGNOSIS — R601 Generalized edema: Secondary | ICD-10-CM | POA: Diagnosis present

## 2020-06-13 DIAGNOSIS — Z79899 Other long term (current) drug therapy: Secondary | ICD-10-CM

## 2020-06-13 DIAGNOSIS — I674 Hypertensive encephalopathy: Secondary | ICD-10-CM | POA: Diagnosis not present

## 2020-06-13 DIAGNOSIS — J45909 Unspecified asthma, uncomplicated: Secondary | ICD-10-CM | POA: Diagnosis present

## 2020-06-13 DIAGNOSIS — E1165 Type 2 diabetes mellitus with hyperglycemia: Secondary | ICD-10-CM | POA: Diagnosis present

## 2020-06-13 DIAGNOSIS — Z86718 Personal history of other venous thrombosis and embolism: Secondary | ICD-10-CM

## 2020-06-13 DIAGNOSIS — E1143 Type 2 diabetes mellitus with diabetic autonomic (poly)neuropathy: Secondary | ICD-10-CM | POA: Diagnosis present

## 2020-06-13 DIAGNOSIS — H538 Other visual disturbances: Secondary | ICD-10-CM

## 2020-06-13 DIAGNOSIS — K3184 Gastroparesis: Secondary | ICD-10-CM | POA: Diagnosis present

## 2020-06-13 DIAGNOSIS — Z7902 Long term (current) use of antithrombotics/antiplatelets: Secondary | ICD-10-CM

## 2020-06-13 DIAGNOSIS — N1831 Chronic kidney disease, stage 3a: Secondary | ICD-10-CM | POA: Diagnosis present

## 2020-06-13 DIAGNOSIS — Z8249 Family history of ischemic heart disease and other diseases of the circulatory system: Secondary | ICD-10-CM

## 2020-06-13 DIAGNOSIS — N170 Acute kidney failure with tubular necrosis: Secondary | ICD-10-CM | POA: Diagnosis present

## 2020-06-13 DIAGNOSIS — I129 Hypertensive chronic kidney disease with stage 1 through stage 4 chronic kidney disease, or unspecified chronic kidney disease: Secondary | ICD-10-CM | POA: Diagnosis present

## 2020-06-13 DIAGNOSIS — E876 Hypokalemia: Secondary | ICD-10-CM | POA: Diagnosis present

## 2020-06-13 DIAGNOSIS — E1122 Type 2 diabetes mellitus with diabetic chronic kidney disease: Secondary | ICD-10-CM | POA: Diagnosis present

## 2020-06-13 DIAGNOSIS — D631 Anemia in chronic kidney disease: Secondary | ICD-10-CM | POA: Diagnosis present

## 2020-06-13 DIAGNOSIS — N049 Nephrotic syndrome with unspecified morphologic changes: Secondary | ICD-10-CM | POA: Diagnosis present

## 2020-06-13 DIAGNOSIS — Z91048 Other nonmedicinal substance allergy status: Secondary | ICD-10-CM

## 2020-06-13 DIAGNOSIS — F411 Generalized anxiety disorder: Secondary | ICD-10-CM | POA: Diagnosis present

## 2020-06-13 DIAGNOSIS — D500 Iron deficiency anemia secondary to blood loss (chronic): Secondary | ICD-10-CM | POA: Diagnosis present

## 2020-06-13 DIAGNOSIS — Z7984 Long term (current) use of oral hypoglycemic drugs: Secondary | ICD-10-CM

## 2020-06-13 LAB — COMPREHENSIVE METABOLIC PANEL
ALT: 26 U/L (ref 0–44)
AST: 36 U/L (ref 15–41)
Albumin: 2.3 g/dL — ABNORMAL LOW (ref 3.5–5.0)
Alkaline Phosphatase: 277 U/L — ABNORMAL HIGH (ref 38–126)
Anion gap: 7 (ref 5–15)
BUN: 19 mg/dL (ref 6–20)
CO2: 27 mmol/L (ref 22–32)
Calcium: 8.1 mg/dL — ABNORMAL LOW (ref 8.9–10.3)
Chloride: 101 mmol/L (ref 98–111)
Creatinine, Ser: 1.54 mg/dL — ABNORMAL HIGH (ref 0.61–1.24)
GFR, Estimated: 57 mL/min — ABNORMAL LOW (ref >60)
Glucose, Bld: 146 mg/dL — ABNORMAL HIGH (ref 70–99)
Potassium: 3.6 mmol/L (ref 3.5–5.1)
Sodium: 135 mmol/L (ref 135–145)
Total Bilirubin: 0.8 mg/dL (ref 0.3–1.2)
Total Protein: 6.1 g/dL — ABNORMAL LOW (ref 6.5–8.1)

## 2020-06-13 LAB — CBC
HCT: 28.7 % — ABNORMAL LOW (ref 39.0–52.0)
HCT: 30.4 % — ABNORMAL LOW (ref 39.0–52.0)
Hemoglobin: 9.6 g/dL — ABNORMAL LOW (ref 13.0–17.0)
Hemoglobin: 9.9 g/dL — ABNORMAL LOW (ref 13.0–17.0)
MCH: 28.6 pg (ref 26.0–34.0)
MCH: 28 pg (ref 26.0–34.0)
MCHC: 33.4 g/dL (ref 30.0–36.0)
MCHC: 32.6 g/dL (ref 30.0–36.0)
MCV: 85.4 fL (ref 80.0–100.0)
MCV: 85.9 fL (ref 80.0–100.0)
Platelets: 390 10*3/uL (ref 150–400)
Platelets: 415 10*3/uL — ABNORMAL HIGH (ref 150–400)
RBC: 3.36 MIL/uL — ABNORMAL LOW (ref 4.22–5.81)
RBC: 3.54 MIL/uL — ABNORMAL LOW (ref 4.22–5.81)
RDW: 13.2 % (ref 11.5–15.5)
RDW: 13.1 % (ref 11.5–15.5)
WBC: 8.5 10*3/uL (ref 4.0–10.5)
WBC: 11.8 10*3/uL — ABNORMAL HIGH (ref 4.0–10.5)
nRBC: 0 % (ref 0.0–0.2)
nRBC: 0 % (ref 0.0–0.2)

## 2020-06-13 LAB — BASIC METABOLIC PANEL
Anion gap: 8 (ref 5–15)
BUN: 18 mg/dL (ref 6–20)
CO2: 27 mmol/L (ref 22–32)
Calcium: 8.2 mg/dL — ABNORMAL LOW (ref 8.9–10.3)
Chloride: 100 mmol/L (ref 98–111)
Creatinine, Ser: 1.48 mg/dL — ABNORMAL HIGH (ref 0.61–1.24)
GFR, Estimated: 60 mL/min — ABNORMAL LOW (ref >60)
Glucose, Bld: 180 mg/dL — ABNORMAL HIGH (ref 70–99)
Potassium: 3.6 mmol/L (ref 3.5–5.1)
Sodium: 135 mmol/L (ref 135–145)

## 2020-06-13 LAB — LIPASE, BLOOD: Lipase: 18 U/L (ref 11–51)

## 2020-06-13 LAB — TROPONIN I (HIGH SENSITIVITY)
Troponin I (High Sensitivity): 17 ng/L (ref <18)
Troponin I (High Sensitivity): 23 ng/L — ABNORMAL HIGH (ref <18)

## 2020-06-13 LAB — CBG MONITORING, ED: Glucose-Capillary: 139 mg/dL — ABNORMAL HIGH (ref 70–99)

## 2020-06-13 LAB — GLUCOSE, CAPILLARY
Glucose-Capillary: 160 mg/dL — ABNORMAL HIGH (ref 70–99)
Glucose-Capillary: 196 mg/dL — ABNORMAL HIGH (ref 70–99)

## 2020-06-13 MED ORDER — METOPROLOL TARTRATE 25 MG PO TABS
25.0000 mg | ORAL_TABLET | Freq: Two times a day (BID) | ORAL | 0 refills | Status: DC
Start: 2020-06-13 — End: 2020-06-17

## 2020-06-13 MED ORDER — AMLODIPINE BESYLATE 5 MG PO TABS: 5.0000 mg | ORAL_TABLET | Freq: Every day | ORAL | 0 refills | Status: DC

## 2020-06-13 MED ORDER — CLONIDINE HCL 0.1 MG PO TABS: 0.1000 mg | ORAL_TABLET | Freq: Two times a day (BID) | ORAL | 0 refills | Status: DC

## 2020-06-13 MED ORDER — METOPROLOL TARTRATE 25 MG PO TABS: 25.0000 mg | ORAL_TABLET | Freq: Two times a day (BID) | ORAL | Status: DC

## 2020-06-13 NOTE — Progress Notes (Signed)
Pt refused bed alarm but was educated about safety.. Will continue to monitor. 

## 2020-06-13 NOTE — Progress Notes (Signed)
Bradley Lopez, [place] 06/13/20  Subjective:  Bradley Lopez is a 45 y.o. is a african [place] male with PMHX of type 2 diabetes, poorly controlled hypertension. 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 He presented to ED with nausea and vomiting.   He was admitted with hypertensive emergency.  Patient seen sitting at side of bed eating breakfast Complains of low back pain on the right and says it comes to the front. Says it started a couple weeks ago after he coughed and then he fell a week ago.  Denies shortness of breath and chest discomfort Able to eat meals, denies nausea States swelling has improved  Objective:  Vital signs in last 24 hours:  Temp:  [97.5 F (36.4 C)-98.4 F (36.9 C)] 98.4 F (36.9 C) (02/21 1207) Pulse Rate:  [70-83] 70 (02/21 1207) Resp:  [14-18] 16 (02/21 1207) BP: (145-187)/(82-106) 145/82 (02/21 1207) SpO2:  [97 %-100 %] 100 % (02/21 1207) Weight:  [103.3 kg] 103.3 kg (02/21 0424)  Weight change:  There were no vitals filed for this visit.  Intake/Output:    Intake/Output Summary (Last 24 hours) at 06/13/2020 1351 Last data filed at 06/13/2020 1212 Gross per 24 hour  Intake 440 ml  Output 325 ml  Net 115 ml    Physical Exam: General:  No acute distress, laying in the bed  HEENT  anicteric, moist oral mucous membrane  Pulm/lungs  normal breathing effort, lungs are clear to auscultation  CVS/Heart  regular rhythm and rate  Abdomen:   Soft, nontender  Extremities:  ++ peripheral edema upto mid thigh  Neurologic:  Alert, oriented, able to follow commands  Skin:  No acute rashes     Basic Metabolic Panel:  Recent Labs  Lab 06/10/20 1029 06/10/20 1815 06/11/20 0511 06/13/20 0857  NA 141  --  139 135  K 3.8  --  3.3* 3.6  CL 105  --  104 100  CO2 28  --  27 27  GLUCOSE 182*  --  167* 180*  BUN 19  --  19 18  CREATININE 1.63*  --  1.63* 1.48*   CALCIUM 8.6*  --  8.2* 8.2*  MG 1.8 1.5* 1.8  --      CBC: Recent Labs  Lab 06/10/20 1029 06/11/20 0511 06/13/20 0857  WBC 10.7* 9.4 8.5  NEUTROABS 7.8*  --   --   HGB 10.2* 8.9* 9.6*  HCT 30.9* 26.7* 28.7*  MCV 86.1 87.0 85.4  PLT 446* 385 390      Lab Results  Component Value Date   HEPBSAG NON REACTIVE 06/11/2020   HEPBSAB Reactive (A) 06/11/2020      Microbiology:  Recent Results (from the past 240 hour(s))  Resp Panel by RT-PCR (Flu A&B, Covid) Nasopharyngeal Swab     Status: None   Collection Time: 06/10/20 12:14 PM   Specimen: Nasopharyngeal Swab; Nasopharyngeal(NP) swabs in vial transport medium  Result Value Ref Range Status   SARS Coronavirus 2 by RT PCR NEGATIVE NEGATIVE Final    Comment: (NOTE) SARS-CoV-2 target nucleic acids are NOT DETECTED.  The SARS-CoV-2 RNA is generally detectable in upper respiratory specimens during the acute phase of infection. The lowest concentration of SARS-CoV-2 viral copies this assay can detect is 138 copies/mL. A negative result does not preclude SARS-Cov-2 infection and should not be used as the sole basis for treatment or other patient management decisions. A negative result may occur with  improper specimen collection/handling, submission of specimen other than nasopharyngeal swab, presence of viral mutation(s) within the areas targeted by this assay, and inadequate number of viral copies(<138 copies/mL). A negative result must be combined with clinical observations, patient history, and epidemiological information. The expected result is Negative.  Fact Sheet for Patients:  EntrepreneurPulse.com.au  Fact Sheet for Healthcare Providers:  IncredibleEmployment.be  This test is no t yet approved or cleared by the [place] FDA and  has been authorized for detection and/or diagnosis of SARS-CoV-2 by FDA under an Emergency Use Authorization (EUA). This EUA will remain  in  effect (meaning this test can be used) for the duration of the COVID-19 declaration under Section 564(b)(1) of the Act, 21 U.S.C.section 360bbb-3(b)(1), unless the authorization is terminated  or revoked sooner.       Influenza A by PCR NEGATIVE NEGATIVE Final   Influenza B by PCR NEGATIVE NEGATIVE Final    Comment: (NOTE) The Xpert Xpress SARS-CoV-2/FLU/RSV plus assay is intended as an aid in the diagnosis of influenza from Nasopharyngeal swab specimens and should not be used as a sole basis for treatment. Nasal washings and aspirates are unacceptable for Xpert Xpress SARS-CoV-2/FLU/RSV testing.  Fact Sheet for Patients: EntrepreneurPulse.com.au  Fact Sheet for Healthcare Providers: IncredibleEmployment.be  This test is not yet approved or cleared by the [place] FDA and has been authorized for detection and/or diagnosis of SARS-CoV-2 by FDA under an Emergency Use Authorization (EUA). This EUA will remain in effect (meaning this test can be used) for the duration of the COVID-19 declaration under Section 564(b)(1) of the Act, 21 U.S.C. section 360bbb-3(b)(1), unless the authorization is terminated or revoked.  Performed at [facility], [address] [place], [place] [address]     Coagulation Studies: Recent Labs    06/11/20 0511  LABPROT 17.5*  INR 1.5*    Urinalysis: Recent Labs    06/10/20 1411  COLORURINE YELLOW*  LABSPEC 1.010  PHURINE 7.0  GLUCOSEU 50*  HGBUR MODERATE*  BILIRUBINUR NEGATIVE  KETONESUR NEGATIVE  PROTEINUR >=300*  NITRITE NEGATIVE  LEUKOCYTESUR MODERATE*      Imaging: ECHOCARDIOGRAM COMPLETE  Result Date: 06/12/2020    ECHOCARDIOGRAM REPORT   Patient Name:   Bradley Lopez Date of Exam: 06/11/2020 Medical Rec #:  [id number]   Height:       75.0 in Accession #:    [id number]  Weight:       230.3 lb Date of Birth:  [birth year]   BSA:          2.330 m Patient Age:    16 years    BP:            186/104 mmHg Patient Gender: M           HR:           84 bpm. Exam Location:  [facility] Procedure: 2D Echo, Cardiac Doppler and Color Doppler Indications:     Anasarca [id number]  History:         Patient has no prior history of Echocardiogram examinations.                  Risk Factors:Hypertension and Diabetes.  Sonographer:     Bradley Lopez Referring Phys:  [id number] Bradley Lopez Diagnosing Phys: Serafina Royals MD IMPRESSIONS  1. Left ventricular ejection fraction, by estimation, is 60 to 65%. The left ventricle has normal function. The left ventricle has no regional wall motion abnormalities. There is moderate left ventricular hypertrophy.  Left ventricular diastolic parameters are consistent with Grade II diastolic dysfunction (pseudonormalization).  2. Right ventricular systolic function is normal. The right ventricular size is normal.  3. Left atrial size was mildly dilated.  4. Moderate pleural effusion in the left lateral region.  5. The mitral valve is normal in structure. Mild to moderate mitral valve regurgitation.  6. The aortic valve is normal in structure. Aortic valve regurgitation is trivial. FINDINGS  Left Ventricle: Left ventricular ejection fraction, by estimation, is 60 to 65%. The left ventricle has normal function. The left ventricle has no regional wall motion abnormalities. The left ventricular internal cavity size was normal in size. There is  moderate left ventricular hypertrophy. Left ventricular diastolic parameters are consistent with Grade II diastolic dysfunction (pseudonormalization). Right Ventricle: The right ventricular size is normal. No increase in right ventricular wall thickness. Right ventricular systolic function is normal. Left Atrium: Left atrial size was mildly dilated. Right Atrium: Right atrial size was normal in size. Pericardium: There is no evidence of pericardial effusion. Mitral Valve: The mitral valve is normal in structure. Mild to moderate mitral valve  regurgitation. Tricuspid Valve: The tricuspid valve is normal in structure. Tricuspid valve regurgitation is mild. Aortic Valve: The aortic valve is normal in structure. Aortic valve regurgitation is trivial. Aortic valve peak gradient measures 3.7 mmHg. Pulmonic Valve: The pulmonic valve was normal in structure. Pulmonic valve regurgitation is not visualized. Aorta: The aortic root and ascending aorta are structurally normal, with no evidence of dilitation. IAS/Shunts: No atrial level shunt detected by color flow Doppler. Additional Comments: There is a moderate pleural effusion in the left lateral region.  LEFT VENTRICLE PLAX 2D LVIDd:         5.04 cm  Diastology LVIDs:         3.94 cm  LV e' medial:    7.18 cm/s LV PW:         1.23 cm  LV E/e' medial:  16.0 LV IVS:        1.32 cm  LV e' lateral:   6.64 cm/s LVOT diam:     2.00 cm  LV E/e' lateral: 17.3 LVOT Area:     3.14 cm  RIGHT VENTRICLE RV S prime:     14.50 cm/s TAPSE (M-mode): 2.2 cm LEFT ATRIUM             Index       RIGHT ATRIUM           Index LA diam:        4.20 cm 1.80 cm/m  RA Area:     16.20 cm LA Vol (A2C):   60.1 ml 25.79 ml/m RA Volume:   40.70 ml  17.47 ml/m LA Vol (A4C):   51.7 ml 22.19 ml/m LA Biplane Vol: 57.1 ml 24.51 ml/m  AORTIC VALVE               PULMONIC VALVE AV Area (Vmax): 2.86 cm   PV Vmax:        0.98 m/s AV Vmax:        96.10 cm/s PV Peak grad:   3.8 mmHg AV Peak Grad:   3.7 mmHg   RVOT Peak grad: 3 mmHg LVOT Vmax:      87.40 cm/s  AORTA Ao Root diam: 2.90 cm MITRAL VALVE MV Area (PHT): 4.36 cm     SHUNTS MV Decel Time: 174 msec     Systemic Diam: 2.00 cm MV E velocity: 115.00 cm/s MV A  velocity: 84.80 cm/s MV E/A ratio:  1.36 MV A Prime:    9.7 cm/s Serafina Royals MD Electronically signed by Serafina Royals MD Signature Date/Time: 06/12/2020/6:02:23 AM    Final      Medications:       Assessment/ Plan:  45 y.o. male with  was admitted on 06/11/2020 for  Active Problems:   * No active hospital problems.  *  No admission diagnoses are documented for this encounter.  #.Nephrotic syndrome with proteinuria UPC 8 gm -likely due to poorly controlled diabetes -Clonidine started for BP control along with statins -BP remains elevated -increased Metoprolol '25mg'$  BID -Back pain likely related to pulled muscle  #. Anemia of Chronic kidney disease  Lab Results  Component Value Date   HGB 9.6 (L) 06/13/2020  Decreased since admission Will monitor this level.   #. Secondary hyperparathyroidism of renal origin   No results found for: PTH No results found for: PHOS Monitor calcium and phos level during this admission   #. Diabetes type 2 with CKD Hgb A1c MFr Bld (%)  Date Value  06/11/2020 10.7 (H)  Stable glucose levels Encouraged improved nutrition    LOS: 0 Colon Flattery 2/21/20221:51 PM  Britton [place], [place]

## 2020-06-13 NOTE — Plan of Care (Signed)
  Problem: Education: Goal: Knowledge of General Education information will improve Description: Including pain rating scale, medication(s)/side effects and non-pharmacologic comfort measures Outcome: Progressing   Problem: Health Behavior/Discharge Planning: Goal: Ability to manage health-related needs will improve Outcome: Progressing   Problem: Clinical Measurements: Goal: Cardiovascular complication will be avoided Outcome: Progressing   Problem: Activity: Goal: Risk for activity intolerance will decrease Outcome: Progressing   Problem: Pain Managment: Goal: General experience of comfort will improve Outcome: Progressing   Problem: Safety: Goal: Ability to remain free from injury will improve Outcome: Progressing   

## 2020-06-13 NOTE — Discharge Summary (Addendum)
[facility] NAME: Bradley Lopez    MR#:  [id number]  DATE OF BIRTH:  03-18-1976  DATE OF ADMISSION:  06/10/2020   ADMITTING PHYSICIAN: Max Sane, MD  DATE OF DISCHARGE: 06/13/2020  1:48 PM  PRIMARY CARE PHYSICIAN: Baxter Hire, MD   ADMISSION DIAGNOSIS:  Hypertensive crisis [I16.9] Hypertensive encephalopathy syndrome [I67.4] Scrotal edema [N50.89] Pain in testicle, unspecified laterality [N50.819] Hypertensive emergency [I16.1] DISCHARGE DIAGNOSIS:  Principal Problem:   Hypertensive emergency Active Problems:   Mixed hyperlipidemia   Type 2 diabetes mellitus without complication, without long-term current use of insulin (HCC)   Nausea & vomiting   Anasarca   Elevated troponin   Hypomagnesemia   CKD (chronic kidney disease) stage 3, GFR 30-59 ml/min (HCC)   Hypertensive encephalopathy syndrome   Vision blurring  SECONDARY DIAGNOSIS:   Past Medical History:  Diagnosis Date  . Asthma   . Diabetes mellitus without complication ([place])   . Hypertension    HOSPITAL COURSE:  45 y.o.malewith medical history significant forpoorly controlled hypertension, type 2 diabetes mellitus, history of bilateral lower extremity DVT in 2021 chronically anticoagulated on Xarelto, stage IIIa chronic kidney disease with baseline creatinine 1.7-1.9 admitted forhypertensive emergencyafter presenting from home to [facility] ED complaining of nausea/vomiting   Hypertensive emergency POA and now resolved. - Blood pressure on admission was 252/139 with a MAP of 177 with evidence of endorgan damage with mild elevation of transaminases and symptoms of blurry vision, headache, nausea and vomiting. This is likely the result of longstanding chronically uncontrolled hypertension and noncompliance - MRI of the brain negative for any acute pathology - Carotid Dopplers shows no hemodynamically significant stenosis - Echo shows normal LV systolic function but grade 2  diastolic dysfunction - Gradually started on oral BP meds for improvement in his blood pressure with last BP 145/82 today with complete resolution of all symptoms except blurry vision for which I've requested him to have eye exam at [facility] eye center. - He is being D/Ced on lisinopril, Lasix, clonidine, metoprolol and amlodipine. - I've discussed his D/C plans with his dad (Dr Brynda Greathouse), Dr Clayborn Bigness (Cardio), Dr Candiss Norse (Nephro) and also updated his PCP. Everyone agreeable with D/C plans including patient. He has been tolerating diet without any N/V while in the Hospital.  CKD 3A Nephrotic syndrome with proteinuria Seen by nephrology  Likely from underlying longstanding uncontrolled hypertension and uncontrolled diabetes Scheduled outpt nephro appt for March 3rd  Uncontrolled diabetes Hemoglobin A1c 10.7 suggestive of poorly controlled diabetes Sliding scale insulin while hospitalized. Continue glipizide at D/C and outpt f/up with PCP for further adjustment in meds for better control.   Hypomagnesemia Repleted  History of DVT On Xarelto   DISCHARGE CONDITIONS:  stable CONSULTS OBTAINED:   DRUG ALLERGIES:   Allergies  Allergen Reactions  . Pine Hives and Itching   DISCHARGE MEDICATIONS:   Allergies as of 06/13/2020      Reactions   Pine Hives, Itching      Medication List    STOP taking these medications   ibuprofen 800 MG tablet Commonly known as: ADVIL     TAKE these medications   amLODipine 5 MG tablet Commonly known as: NORVASC Take 1 tablet (5 mg total) by mouth daily. Start taking on: June 14, 2020   atorvastatin 40 MG tablet Commonly known as: LIPITOR Take 40 mg by mouth at bedtime.   cloNIDine 0.1 MG tablet Commonly known as: CATAPRES Take 1 tablet (0.1 mg total) by  mouth 2 (two) times daily.   furosemide 40 MG tablet Commonly known as: LASIX Take 40 mg by mouth daily.   glipiZIDE 2.5 MG 24 hr tablet Commonly known as: GLUCOTROL XL Take 2.5  mg by mouth daily.   lisinopril 20 MG tablet Commonly known as: ZESTRIL Take 20 mg by mouth daily.   metoprolol tartrate 25 MG tablet Commonly known as: LOPRESSOR Take 1 tablet (25 mg total) by mouth 2 (two) times daily.   rivaroxaban 20 MG Tabs tablet Commonly known as: XARELTO Take 1 tablet (20 mg total) by mouth daily with supper.      DISCHARGE INSTRUCTIONS:   DIET:  Cardiac diet DISCHARGE CONDITION:  Stable ACTIVITY:  Activity as tolerated OXYGEN:  Home Oxygen: No.  Oxygen Delivery: room air DISCHARGE LOCATION:  home   If you experience worsening of your admission symptoms, develop shortness of breath, life threatening emergency, suicidal or homicidal thoughts you must seek medical attention immediately by calling 911 or calling your MD immediately  if symptoms less severe.  You Must read complete instructions/literature along with all the possible adverse reactions/side effects for all the Medicines you take and that have been prescribed to you. Take any new Medicines after you have completely understood and accpet all the possible adverse reactions/side effects.   Please note  You were cared for by a hospitalist during your hospital stay. If you have any questions about your discharge medications or the care you received while you were in the hospital after you are discharged, you can call the unit and asked to speak with the hospitalist on call if the hospitalist that took care of you is not available. Once you are discharged, your primary care physician will handle any further medical issues. Please note that NO REFILLS for any discharge medications will be authorized once you are discharged, as it is imperative that you return to your primary care physician (or establish a relationship with a primary care physician if you do not have one) for your aftercare needs so that they can reassess your need for medications and monitor your lab values.    On the day of  Discharge:  VITAL SIGNS:  Blood pressure (!) 145/82, pulse 70, temperature 98.4 F (36.9 C), temperature source Oral, resp. rate 16, height '6\' 3"'$  (1.905 m), weight 103.3 kg, SpO2 100 %. PHYSICAL EXAMINATION:  GENERAL:  45 y.o.-year-old patient lying in the bed with no acute distress.  EYES: Pupils equal, round, reactive to light and accommodation. No scleral icterus. Extraocular muscles intact.  HEENT: Head atraumatic, normocephalic. Oropharynx and nasopharynx clear.  NECK:  Supple, no jugular venous distention. No thyroid enlargement, no tenderness.  LUNGS: Normal breath sounds bilaterally, no wheezing, rales,rhonchi or crepitation. No use of accessory muscles of respiration.  CARDIOVASCULAR: S1, S2 normal. No murmurs, rubs, or gallops.  ABDOMEN: Soft, non-tender, non-distended. Bowel sounds present. No organomegaly or mass.  EXTREMITIES: No pedal edema, cyanosis, or clubbing.  NEUROLOGIC: Cranial nerves II through XII are intact. Muscle strength 5/5 in all extremities. Sensation intact. Gait not checked.  PSYCHIATRIC: The patient is alert and oriented x 3.  SKIN: No obvious rash, lesion, or ulcer.  DATA REVIEW:   CBC Recent Labs  Lab 06/13/20 2017  WBC 11.8*  HGB 9.9*  HCT 30.4*  PLT 415*    Chemistries  Recent Labs  Lab 06/11/20 0511 06/13/20 0857 06/13/20 2017  NA 139   < > 135  K 3.3*   < > 3.6  CL  104   < > 101  CO2 27   < > 27  GLUCOSE 167*   < > 146*  BUN 19   < > 19  CREATININE 1.63*   < > 1.54*  CALCIUM 8.2*   < > 8.1*  MG 1.8  --   --   AST 27  --  36  ALT 21  --  26  ALKPHOS 247*  --  277*  BILITOT 0.6  --  0.8   < > = values in this interval not displayed.     Outpatient follow-up  Follow-up Information    Baxter Hire, MD. Go on 06/14/2020.   Specialty: Internal Medicine Why: as scheduled  at 4:15pm Contact information: [place] [address] [phone]        Murlean Iba, MD. Go on 06/23/2020.   Specialty:  Nephrology Why: as scheduled Contact information: [place] [address] [phone]        Yolonda Kida, MD. Schedule an appointment as soon as possible for a visit on 06/13/2020.   Specialties: Cardiology, Internal Medicine Why: @ 2:30pm Contact information: [place] [address] [phone]        Birder Robson, MD. Schedule an appointment as soon as possible for a visit on 06/27/2020.   Specialty: Ophthalmology Why: @ 10:40am Contact information: [place] [address] [phone]               30 Day Unplanned Readmission Risk Score   Flowsheet Row ED to Hosp-Admission (Discharged) from 06/10/2020 in [facility]  30 Day Unplanned Readmission Risk Score (%) 9.29 Filed at 06/13/2020 1200     This score is the patient's risk of an unplanned readmission within 30 days of being discharged (0 -100%). The score is based on dignosis, age, lab data, medications, orders, and past utilization.   Low:  0-14.9   Medium: 15-21.9   High: 22-29.9   Extreme: 30 and above         Management plans discussed with the patient, family and they are in agreement.  CODE STATUS: Prior   TOTAL TIME TAKING CARE OF THIS PATIENT: 45 minutes.    Max Sane M.D on 06/13/2020 at 9:21 PM  [facility]   CC: Primary care physician; Baxter Hire, MD   Note: This dictation was prepared with Dragon dictation along with smaller phrase technology. Any transcriptional errors that result from this process are unintentional.

## 2020-06-13 NOTE — Discharge Instructions (Signed)

## 2020-06-13 NOTE — ED Triage Notes (Addendum)
Pt states he was admitted to hospital for hypertension and was d/c today. Pt states he came back because once he got home and ate some dinner he had 7 occurrences of emesis and has mid abd pain. Pt states he is also having lower back pain, that has been going on for a few days now but worse today.

## 2020-06-13 NOTE — Progress Notes (Addendum)
Pt code is not on file on pt chart. Pt was informed and requested to be full code. Ouma NP made aware. Will continue to monitor.  Update 0451: NP Ouma placed order for full code. Will continue to monitor.

## 2020-06-13 NOTE — Progress Notes (Signed)
[place], [place] 06/13/20  Subjective:  Bradley Lopez is a 45 y.o. is a african [place] male with PMHX of type 2 diabetes, poorly controlled hypertension. 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 He presented to ED with nausea and vomiting.   He was admitted with hypertensive emergency.  Patient seen laying in bed Alert and oriented States he is feeling well today Denies shortness of breath and chest pain Able to eat meals Denies nausea States that Leg edema is improving Wife at bedside   Objective:  Vital signs in last 24 hours:  Temp:  [97.6 F (36.4 C)-98.4 F (36.9 C)] 98.4 F (36.9 C) (02/21 1207) Pulse Rate:  [70-83] 70 (02/21 1207) Resp:  [14-18] 16 (02/21 1207) BP: (145-187)/(82-104) 145/82 (02/21 1207) SpO2:  [97 %-100 %] 100 % (02/21 1207) Weight:  [103.3 kg] 103.3 kg (02/21 0424)  Weight change: -1.481 kg Filed Weights   06/11/20 0100 06/12/20 0016 06/13/20 0424  Weight: 104.5 kg 104.8 kg 103.3 kg    Intake/Output:    Intake/Output Summary (Last 24 hours) at 06/13/2020 1733 Last data filed at 06/13/2020 1212 Gross per 24 hour  Intake 440 ml  Output 325 ml  Net 115 ml    Physical Exam: General:  No acute distress, laying in the bed  HEENT  anicteric, moist oral mucous membrane  Pulm/lungs  normal breathing effort, lungs are clear to auscultation  CVS/Heart  regular rhythm and rate  Abdomen:   Soft, nontender  Extremities:  ++ peripheral edema upto mid abdomen  Neurologic:  Alert, oriented, able to follow commands  Skin:  No acute rashes     Basic Metabolic Panel:  Recent Labs  Lab 06/10/20 1029 06/10/20 1815 06/11/20 0511 06/13/20 0857  NA 141  --  139 135  K 3.8  --  3.3* 3.6  CL 105  --  104 100  CO2 28  --  27 27  GLUCOSE 182*  --  167* 180*  BUN 19  --  19 18  CREATININE 1.63*  --  1.63* 1.48*  CALCIUM 8.6*  --  8.2* 8.2*  MG 1.8 1.5*  1.8  --      CBC: Recent Labs  Lab 06/10/20 1029 06/11/20 0511 06/13/20 0857  WBC 10.7* 9.4 8.5  NEUTROABS 7.8*  --   --   HGB 10.2* 8.9* 9.6*  HCT 30.9* 26.7* 28.7*  MCV 86.1 87.0 85.4  PLT 446* 385 390      Lab Results  Component Value Date   HEPBSAG NON REACTIVE 06/11/2020   HEPBSAB Reactive (A) 06/11/2020      Microbiology:  Recent Results (from the past 240 hour(s))  Resp Panel by RT-PCR (Flu A&B, Covid) Nasopharyngeal Swab     Status: None   Collection Time: 06/10/20 12:14 PM   Specimen: Nasopharyngeal Swab; Nasopharyngeal(NP) swabs in vial transport medium  Result Value Ref Range Status   SARS Coronavirus 2 by RT PCR NEGATIVE NEGATIVE Final    Comment: (NOTE) SARS-CoV-2 target nucleic acids are NOT DETECTED.  The SARS-CoV-2 RNA is generally detectable in upper respiratory specimens during the acute phase of infection. The lowest concentration of SARS-CoV-2 viral copies this assay can detect is 138 copies/mL. A negative result does not preclude SARS-Cov-2 infection and should not be used as the sole basis for treatment or other patient management decisions. A negative result may occur with  improper specimen collection/handling, submission of specimen other than  nasopharyngeal swab, presence of viral mutation(s) within the areas targeted by this assay, and inadequate number of viral copies(<138 copies/mL). A negative result must be combined with clinical observations, patient history, and epidemiological information. The expected result is Negative.  Fact Sheet for Patients:  EntrepreneurPulse.com.au  Fact Sheet for Healthcare Providers:  IncredibleEmployment.be  This test is no t yet approved or cleared by the [place] FDA and  has been authorized for detection and/or diagnosis of SARS-CoV-2 by FDA under an Emergency Use Authorization (EUA). This EUA will remain  in effect (meaning this test can be used) for  the duration of the COVID-19 declaration under Section 564(b)(1) of the Act, 21 U.S.C.section 360bbb-3(b)(1), unless the authorization is terminated  or revoked sooner.       Influenza A by PCR NEGATIVE NEGATIVE Final   Influenza B by PCR NEGATIVE NEGATIVE Final    Comment: (NOTE) The Xpert Xpress SARS-CoV-2/FLU/RSV plus assay is intended as an aid in the diagnosis of influenza from Nasopharyngeal swab specimens and should not be used as a sole basis for treatment. Nasal washings and aspirates are unacceptable for Xpert Xpress SARS-CoV-2/FLU/RSV testing.  Fact Sheet for Patients: EntrepreneurPulse.com.au  Fact Sheet for Healthcare Providers: IncredibleEmployment.be  This test is not yet approved or cleared by the [place] FDA and has been authorized for detection and/or diagnosis of SARS-CoV-2 by FDA under an Emergency Use Authorization (EUA). This EUA will remain in effect (meaning this test can be used) for the duration of the COVID-19 declaration under Section 564(b)(1) of the Act, 21 U.S.C. section 360bbb-3(b)(1), unless the authorization is terminated or revoked.  Performed at [facility], [address] [place], [place] [address]     Coagulation Studies: Recent Labs    06/11/20 0511  LABPROT 17.5*  INR 1.5*    Urinalysis: No results for input(s): COLORURINE, LABSPEC, PHURINE, GLUCOSEU, HGBUR, BILIRUBINUR, KETONESUR, PROTEINUR, UROBILINOGEN, NITRITE, LEUKOCYTESUR in the last 72 hours.  Invalid input(s): APPERANCEUR    Imaging: No results found.   Medications:   . sodium chloride Stopped (06/10/20 2256)   . amLODipine  5 mg Oral Daily  . atorvastatin  40 mg Oral QHS  . cloNIDine  0.1 mg Oral BID  . furosemide  40 mg Intravenous BID  . insulin aspart  0-9 Units Subcutaneous TID WC  . lisinopril  20 mg Oral Daily  . metoprolol tartrate  25 mg Oral BID  . rivaroxaban  20 mg Oral Q supper   sodium  chloride, acetaminophen, labetalol, LORazepam  Assessment/ Plan:  44 y.o. male with  was admitted on 06/10/2020 for  Principal Problem:   Hypertensive emergency Active Problems:   Mixed hyperlipidemia   Type 2 diabetes mellitus without complication, without long-term current use of insulin (HCC)   Nausea & vomiting   Anasarca   Elevated troponin   Hypomagnesemia   CKD (chronic kidney disease) stage 3, GFR 30-59 ml/min (HCC)   Hypertensive encephalopathy syndrome   Vision blurring  Hypertensive crisis [I16.9] Hypertensive encephalopathy syndrome [I67.4] Scrotal edema [N50.89] Pain in testicle, unspecified laterality [N50.819] Hypertensive emergency [I16.1]  #.Nephrotic syndrome with proteinuria UPC 8 gm # CKD stage 2 GFR 60 -likely due to poorly controlled diabetes -Clonidine started for BP control along with statins -BP remains elevated -increased dose of metoprolol Serologies pending Will follow up as outpatient   #. Anemia of Chronic kidney disease  Lab Results  Component Value Date   HGB 9.6 (L) 06/13/2020  Decreased since admission Will monitor this  level.    #. Diabetes type 2 with CKD Hgb A1c MFr Bld (%)  Date Value  06/11/2020 10.7 (H)  Stable glucose levels Discussed need for stricter blood sugar controls   LOS: [place] 2/21/20225:33 [place], [place]

## 2020-06-14 ENCOUNTER — Inpatient Hospital Stay (HOSPITAL_COMMUNITY)
Admission: EM | Admit: 2020-06-14 | Discharge: 2020-06-17 | Disposition: A | Discharge status: Home / Self Care | Payer: BC Managed Care – PPO | Source: Home / Self Care | Attending: Internal Medicine | Admitting: Internal Medicine

## 2020-06-14 ENCOUNTER — Emergency Department: Payer: BC Managed Care – PPO

## 2020-06-14 ENCOUNTER — Encounter: Payer: Self-pay | Admitting: Internal Medicine

## 2020-06-14 DIAGNOSIS — N189 Chronic kidney disease, unspecified: Secondary | ICD-10-CM

## 2020-06-14 DIAGNOSIS — N183 Chronic kidney disease, stage 3 unspecified: Secondary | ICD-10-CM | POA: Diagnosis present

## 2020-06-14 DIAGNOSIS — E1122 Type 2 diabetes mellitus with diabetic chronic kidney disease: Secondary | ICD-10-CM | POA: Diagnosis present

## 2020-06-14 DIAGNOSIS — E876 Hypokalemia: Secondary | ICD-10-CM

## 2020-06-14 DIAGNOSIS — R601 Generalized edema: Secondary | ICD-10-CM

## 2020-06-14 DIAGNOSIS — N179 Acute kidney failure, unspecified: Secondary | ICD-10-CM

## 2020-06-14 DIAGNOSIS — R112 Nausea with vomiting, unspecified: Secondary | ICD-10-CM

## 2020-06-14 DIAGNOSIS — D500 Iron deficiency anemia secondary to blood loss (chronic): Secondary | ICD-10-CM

## 2020-06-14 DIAGNOSIS — F411 Generalized anxiety disorder: Secondary | ICD-10-CM | POA: Diagnosis present

## 2020-06-14 DIAGNOSIS — I160 Hypertensive urgency: Secondary | ICD-10-CM | POA: Diagnosis not present

## 2020-06-14 DIAGNOSIS — I82413 Acute embolism and thrombosis of femoral vein, bilateral: Secondary | ICD-10-CM | POA: Diagnosis present

## 2020-06-14 DIAGNOSIS — D631 Anemia in chronic kidney disease: Secondary | ICD-10-CM | POA: Diagnosis present

## 2020-06-14 DIAGNOSIS — E1121 Type 2 diabetes mellitus with diabetic nephropathy: Secondary | ICD-10-CM | POA: Diagnosis present

## 2020-06-14 LAB — ANA W/REFLEX IF POSITIVE: Anti Nuclear Antibody (ANA): NEGATIVE

## 2020-06-14 LAB — GLUCOSE, CAPILLARY
Glucose-Capillary: 143 mg/dL — ABNORMAL HIGH (ref 70–99)
Glucose-Capillary: 93 mg/dL (ref 70–99)

## 2020-06-14 LAB — CBG MONITORING, ED
Glucose-Capillary: 138 mg/dL — ABNORMAL HIGH (ref 70–99)
Glucose-Capillary: 145 mg/dL — ABNORMAL HIGH (ref 70–99)

## 2020-06-14 LAB — CALCIUM, IONIZED: Calcium, Ionized, Serum: 4.9 mg/dL (ref 4.5–5.6)

## 2020-06-14 LAB — SARS CORONAVIRUS 2 (TAT 6-24 HRS): SARS Coronavirus 2: NEGATIVE

## 2020-06-14 LAB — MISC LABCORP TEST (SEND OUT): Labcorp test code: 141330

## 2020-06-14 MED ORDER — ONDANSETRON HCL 4 MG PO TABS: 4.0000 mg | ORAL_TABLET | Freq: Four times a day (QID) | ORAL | Status: DC | PRN

## 2020-06-14 MED ORDER — LABETALOL HCL 5 MG/ML IV SOLN
10.0000 mg | Freq: Once | INTRAVENOUS | Status: AC
  Administered 2020-06-14: 10 mg via INTRAVENOUS
  Filled 2020-06-14: qty 4

## 2020-06-14 MED ORDER — HYDRALAZINE HCL 20 MG/ML IJ SOLN: 10.0000 mg | Freq: Four times a day (QID) | INTRAMUSCULAR | Status: DC | PRN

## 2020-06-14 MED ORDER — ACETAMINOPHEN 325 MG PO TABS
650.0000 mg | ORAL_TABLET | Freq: Four times a day (QID) | ORAL | Status: DC | PRN
  Administered 2020-06-17: 650 mg via ORAL
  Filled 2020-06-14: qty 2

## 2020-06-14 MED ORDER — ONDANSETRON HCL 4 MG/2ML IJ SOLN: 4.0000 mg | Freq: Four times a day (QID) | INTRAMUSCULAR | Status: DC | PRN

## 2020-06-14 MED ORDER — ATORVASTATIN CALCIUM 20 MG PO TABS
40.0000 mg | ORAL_TABLET | Freq: Every day | ORAL | Status: DC
  Administered 2020-06-14 – 2020-06-16 (×3): 40 mg via ORAL
  Filled 2020-06-14 (×3): qty 2

## 2020-06-14 MED ORDER — ONDANSETRON HCL 4 MG/2ML IJ SOLN
4.0000 mg | INTRAMUSCULAR | Status: AC
  Administered 2020-06-14: 4 mg via INTRAVENOUS
  Filled 2020-06-14: qty 2

## 2020-06-14 MED ORDER — IOHEXOL 300 MG/ML  SOLN
125.0000 mL | Freq: Once | INTRAMUSCULAR | Status: AC | PRN
  Administered 2020-06-14: 125 mL via INTRAVENOUS

## 2020-06-14 MED ORDER — FUROSEMIDE 40 MG PO TABS
40.0000 mg | ORAL_TABLET | Freq: Every day | ORAL | Status: DC
  Administered 2020-06-14 – 2020-06-15 (×2): 40 mg via ORAL
  Filled 2020-06-14 (×2): qty 1

## 2020-06-14 MED ORDER — LISINOPRIL 10 MG PO TABS
20.0000 mg | ORAL_TABLET | Freq: Once | ORAL | Status: AC
  Administered 2020-06-14: 20 mg via ORAL
  Filled 2020-06-14: qty 2

## 2020-06-14 MED ORDER — CLONIDINE HCL 0.1 MG PO TABS
0.1000 mg | ORAL_TABLET | Freq: Two times a day (BID) | ORAL | Status: DC
  Administered 2020-06-14: 0.1 mg via ORAL
  Filled 2020-06-14: qty 1

## 2020-06-14 MED ORDER — AMLODIPINE BESYLATE 10 MG PO TABS
10.0000 mg | ORAL_TABLET | Freq: Every day | ORAL | Status: DC
  Administered 2020-06-14 – 2020-06-17 (×4): 10 mg via ORAL
  Filled 2020-06-14 (×2): qty 1
  Filled 2020-06-14: qty 2
  Filled 2020-06-14: qty 1

## 2020-06-14 MED ORDER — SODIUM CHLORIDE 0.9 % IV SOLN: 250.0000 mL | INTRAVENOUS | Status: DC | PRN

## 2020-06-14 MED ORDER — METOPROLOL TARTRATE 25 MG PO TABS
25.0000 mg | ORAL_TABLET | Freq: Once | ORAL | Status: AC
  Administered 2020-06-14: 25 mg via ORAL
  Filled 2020-06-14: qty 1

## 2020-06-14 MED ORDER — CLONIDINE HCL 0.1 MG PO TABS
0.1000 mg | ORAL_TABLET | Freq: Once | ORAL | Status: AC
  Administered 2020-06-14: 0.1 mg via ORAL
  Filled 2020-06-14: qty 1

## 2020-06-14 MED ORDER — AMLODIPINE BESYLATE 5 MG PO TABS
5.0000 mg | ORAL_TABLET | Freq: Once | ORAL | Status: AC
  Administered 2020-06-14: 5 mg via ORAL
  Filled 2020-06-14: qty 1

## 2020-06-14 MED ORDER — METOCLOPRAMIDE HCL 10 MG PO TABS
5.0000 mg | ORAL_TABLET | Freq: Three times a day (TID) | ORAL | Status: DC
  Administered 2020-06-14 – 2020-06-16 (×7): 5 mg via ORAL
  Filled 2020-06-14 (×8): qty 1

## 2020-06-14 MED ORDER — SODIUM CHLORIDE 0.9% FLUSH: 3.0000 mL | INTRAVENOUS | Status: DC | PRN

## 2020-06-14 MED ORDER — DICLOFENAC SODIUM 1 % EX GEL
2.0000 g | Freq: Four times a day (QID) | CUTANEOUS | Status: DC
  Administered 2020-06-14 – 2020-06-15 (×4): 2 g via TOPICAL
  Filled 2020-06-14: qty 100

## 2020-06-14 MED ORDER — INSULIN ASPART 100 UNIT/ML ~~LOC~~ SOLN
0.0000 [IU] | Freq: Three times a day (TID) | SUBCUTANEOUS | Status: DC
  Administered 2020-06-14 – 2020-06-15 (×5): 2 [IU] via SUBCUTANEOUS
  Administered 2020-06-15: 3 [IU] via SUBCUTANEOUS
  Administered 2020-06-16 (×2): 2 [IU] via SUBCUTANEOUS
  Administered 2020-06-17: 3 [IU] via SUBCUTANEOUS
  Filled 2020-06-14 (×9): qty 1

## 2020-06-14 MED ORDER — FUROSEMIDE 10 MG/ML IJ SOLN
40.0000 mg | Freq: Once | INTRAMUSCULAR | Status: AC
  Administered 2020-06-14: 40 mg via INTRAVENOUS
  Filled 2020-06-14: qty 4

## 2020-06-14 MED ORDER — LISINOPRIL 10 MG PO TABS
20.0000 mg | ORAL_TABLET | Freq: Every day | ORAL | Status: DC
  Administered 2020-06-14: 20 mg via ORAL
  Filled 2020-06-14: qty 2

## 2020-06-14 MED ORDER — SODIUM CHLORIDE 0.9% FLUSH
3.0000 mL | Freq: Two times a day (BID) | INTRAVENOUS | Status: DC
  Administered 2020-06-14 – 2020-06-17 (×7): 3 mL via INTRAVENOUS

## 2020-06-14 MED ORDER — LISINOPRIL 20 MG PO TABS
40.0000 mg | ORAL_TABLET | Freq: Every day | ORAL | Status: DC
  Administered 2020-06-15 – 2020-06-16 (×2): 40 mg via ORAL
  Filled 2020-06-14 (×2): qty 2

## 2020-06-14 MED ORDER — CLONIDINE HCL 0.2 MG/24HR TD PTWK
0.2000 mg | MEDICATED_PATCH | TRANSDERMAL | Status: DC
  Administered 2020-06-14: 0.2 mg via TRANSDERMAL
  Filled 2020-06-14: qty 1

## 2020-06-14 MED ORDER — RIVAROXABAN 20 MG PO TABS
20.0000 mg | ORAL_TABLET | Freq: Every day | ORAL | Status: DC
  Administered 2020-06-14 – 2020-06-16 (×3): 20 mg via ORAL
  Filled 2020-06-14 (×4): qty 1

## 2020-06-14 NOTE — ED Notes (Signed)
Admitting Provider at bedside. 

## 2020-06-14 NOTE — Plan of Care (Signed)
  Problem: Education: Goal: Knowledge of disease or condition will improve Outcome: Adequate for Discharge Goal: Knowledge of the prescribed therapeutic regimen will improve Outcome: Adequate for Discharge   Problem: Bowel/Gastric: Goal: Occurences of nausea and/or vomiting will decrease Outcome: Adequate for Discharge   Problem: Fluid Volume: Goal: Maintenance of adequate hydration will improve Outcome: Adequate for Discharge   Problem: Nutritional: Goal: Achievement of adequate weight for body size and type will improve Outcome: Adequate for Discharge

## 2020-06-14 NOTE — ED Notes (Signed)
Pt using urinal at bedside.

## 2020-06-14 NOTE — ED Provider Notes (Signed)
[facility] Emergency Department Provider Note  ____________________________________________   Event Date/Time   First MD Initiated Contact with Patient 06/14/20 0030     (approximate)  I have reviewed the triage vital signs and the nursing notes.   HISTORY  Chief Complaint Emesis    HPI Bradley Lopez is a 45 y.o. male with a history of poorly controlled diabetes and very poorly controlled hypertension who was just discharged from the hospital within a few hours. He was admitted for hypertensive emergency and nausea and vomiting. He presents again tonight because of recurrence of persistent nausea and vomiting. He states that within a couple of hours of getting home, he ate a light dinner of chicken and broccoli and was going to take his blood pressure medicine. However as soon as he ate, he felt nauseated and has had at least 7 episodes of vomiting since that time. He said that his father is a retired Engineer, drilling and when he called his father his dad told him to come back to the hospital.  He has been unable to take his blood pressure medicine and his blood pressure is elevated at about [id number] systolic over about [id number] systolic upon arrival, which is actually lower than it was during his initial visit but higher than it was prior to discharge when he had a systolic pressure of around 140.  He reports that he has been having issues with nausea and vomiting for at least 2 months. It is also accompanied with intermittent abdominal pain and he said he is currently having generalized abdominal pain worse on the left side of his abdomen.   He denies fever, chills, sore throat, chest pain, and shortness of breath. He reports that the nausea and vomiting are severe and the abdominal pain is moderate. Nothing in particular makes any of the symptoms better and eating makes it worse.        Past Medical History:  Diagnosis Date  . Asthma   . Diabetes mellitus without  complication ([place])   . Hypertension     Patient Active Problem List   Diagnosis Date Noted  . Hypertensive encephalopathy syndrome   . Vision blurring   . Nausea & vomiting 06/11/2020  . Anasarca 06/11/2020  . Elevated troponin 06/11/2020  . Hypomagnesemia 06/11/2020  . CKD (chronic kidney disease) stage 3, GFR 30-59 ml/min (HCC) 06/11/2020  . Hypertensive emergency 06/10/2020  . Lymphedema 05/28/2020  . Pain and swelling of lower leg 05/02/2020  . Acute right-sided low back pain without sciatica 11/21/2018  . History of DVT of lower extremity 11/21/2018  . Mixed hyperlipidemia 11/21/2018  . DVT femoral (deep venous thrombosis) with thrombophlebitis, bilateral ([place]) 08/20/2018  . Essential hypertension 08/20/2018  . Generalized anxiety disorder 08/20/2018  . Type 2 diabetes mellitus without complication, without long-term current use of insulin ([place]) 08/20/2018    History reviewed. No pertinent surgical history.  Prior to Admission medications   Medication Sig Start Date End Date Taking? Authorizing Provider  amLODipine (NORVASC) 5 MG tablet Take 1 tablet (5 mg total) by mouth daily. 06/14/20 07/14/20  Max Sane, MD  atorvastatin (LIPITOR) 40 MG tablet Take 40 mg by mouth at bedtime. 05/17/20   [provider]  cloNIDine (CATAPRES) 0.1 MG tablet Take 1 tablet (0.1 mg total) by mouth 2 (two) times daily. 06/13/20 07/13/20  Max Sane, MD  furosemide (LASIX) 40 MG tablet Take 40 mg by mouth daily. 06/07/20   [provider]  glipiZIDE (GLUCOTROL XL) 2.5  MG 24 hr tablet Take 2.5 mg by mouth daily. 05/17/20   [provider]  lisinopril (ZESTRIL) 20 MG tablet Take 20 mg by mouth daily. 05/17/20   [provider]  metoprolol tartrate (LOPRESSOR) 25 MG tablet Take 1 tablet (25 mg total) by mouth 2 (two) times daily. 06/13/20 07/13/20  Max Sane, MD  rivaroxaban (XARELTO) 20 MG TABS tablet Take 1 tablet (20 mg total) by mouth daily with supper. 05/24/20    Kris Hartmann, NP    Allergies [place]  No family history on file.  Social History Social History   Tobacco Use  . Smoking status: Never Smoker  . Smokeless tobacco: Never Used  Substance Use Topics  . Alcohol use: Not Currently  . Drug use: Not Currently    Review of Systems Constitutional: No fever/chills Eyes: No visual changes. ENT: No sore throat. Cardiovascular: Denies chest pain. Respiratory: Denies shortness of breath. Gastrointestinal: Positive for nausea and vomiting, abdominal pain. Genitourinary: Negative for dysuria. Musculoskeletal: Negative for neck pain.  Negative for back pain. Integumentary: Negative for rash. Neurological: Negative for headaches, focal weakness or numbness.   ____________________________________________   PHYSICAL EXAM:  VITAL SIGNS: ED Triage Vitals  Enc Vitals Group     BP 06/13/20 2006 (!) 206/117     Pulse Rate 06/13/20 2006 96     Resp 06/13/20 2006 18     Temp 06/13/20 2006 98.3 F (36.8 C)     Temp Source 06/13/20 2006 Oral     SpO2 06/13/20 2006 98 %     Weight 06/13/20 2011 102.1 kg (225 lb)     Height 06/13/20 2011 1.905 m ('6\' 3"'$ )     Head Circumference --      Peak Flow --      Pain Score 06/13/20 2006 9     Pain Loc --      Pain Edu? --      Excl. in [place]? --    Constitutional: Alert and oriented. Appears uncomfortable. Eyes: Conjunctivae are normal.  Head: Atraumatic. Nose: No congestion/rhinnorhea. Mouth/Throat: Patient is wearing a mask. Neck: No stridor.  No meningeal signs.   Cardiovascular: Normal rate, regular rhythm. Good peripheral circulation. Respiratory: Normal respiratory effort.  No retractions. Gastrointestinal: Soft and nondistended. Tender to palpation throughout the abdomen most notable in the upper quadrants including epigastrium. He also has some milder tenderness to palpation of the suprapubic region. Musculoskeletal: No lower extremity tenderness nor edema. No gross deformities of  extremities. Neurologic:  Normal speech and language. No gross focal neurologic deficits are appreciated.  Skin:  Skin is warm, dry and intact. Psychiatric: Mood and affect are normal. Speech and behavior are normal.  ____________________________________________   LABS (all labs ordered are listed, but only abnormal results are displayed)  Labs Reviewed  COMPREHENSIVE METABOLIC PANEL - Abnormal; Notable for the following components:      Result Value   Glucose, Bld 146 (*)    Creatinine, Ser 1.54 (*)    Calcium 8.1 (*)    Total Protein 6.1 (*)    Albumin 2.3 (*)    Alkaline Phosphatase 277 (*)    GFR, Estimated 57 (*)    All other components within normal limits  CBC - Abnormal; Notable for the following components:   WBC 11.8 (*)    RBC 3.54 (*)    Hemoglobin 9.9 (*)    HCT 30.4 (*)    Platelets 415 (*)    All other components within normal limits  CBG MONITORING, ED - Abnormal; Notable for the following components:   Glucose-Capillary 139 (*)    All other components within normal limits  TROPONIN I (HIGH SENSITIVITY) - Abnormal; Notable for the following components:   Troponin I (High Sensitivity) 23 (*)    All other components within normal limits  LIPASE, BLOOD  TROPONIN I (HIGH SENSITIVITY)   ____________________________________________  EKG  ED ECG REPORT I, Hinda Kehr, the attending physician, personally viewed and interpreted this ECG.  Date: 06/13/2020 EKG Time: 20: 15 Rate: 91 Rhythm: normal sinus rhythm QRS Axis: normal Intervals: normal ST/T Wave abnormalities: normal Narrative Interpretation: no evidence of acute ischemia ____________________________________________  RADIOLOGY I, Hinda Kehr, personally viewed and evaluated these images (plain radiographs) as part of my medical decision making, as well as reviewing the written report by the radiologist.  ED MD interpretation:  anasarca worse than prior, no acute/emergent findings  Official  radiology report(s): No results found.  ____________________________________________   PROCEDURES   Procedure(s) performed (including Critical Care):  .Critical Care Performed by: Hinda Kehr, MD Authorized by: Hinda Kehr, MD   Critical care provider statement:    Critical care time (minutes):  30   Critical care time was exclusive of:  Separately billable procedures and treating other patients   Critical care was necessary to treat or prevent imminent or life-threatening deterioration of the following conditions: hypertensive urgency.   Critical care was time spent personally by me on the following activities:  Development of treatment plan with patient or surrogate, discussions with consultants, evaluation of patient's response to treatment, examination of patient, obtaining history from patient or surrogate, ordering and performing treatments and interventions, ordering and review of laboratory studies, ordering and review of radiographic studies, pulse oximetry, re-evaluation of patient's condition and review of old charts     ____________________________________________   [place] / MDM / [place] / ED COURSE  As part of my medical decision making, I reviewed the following data within the [place] notes reviewed and incorporated, Labs reviewed , EKG interpreted , Old chart reviewed, Discussed with admitting physician  and Notes from prior ED visits   Differential diagnosis includes, but is not limited to, hypertensive urgency/emergency, chronic gastritis/vomiting, SBO/ileus, biliary disease, pancreatitis.  The patient's vital signs are notable for hypertension with a systolic pressure around [id number] and a diastolic around [id number] and mild tachycardia although the tachycardia is likely due to his discomfort. He is afebrile. He has no respiratory symptoms. He is tender to palpation throughout the abdomen but most notable in the upper  quadrants.  Lab work is notable for very mild troponin elevation which is slightly better than it was during his hospitalization. Lipase is normal, comprehensive metabolic panel is consistent with what it has been in the past which is notable for an elevated creatinine at about 1.5 and a elevated alkaline phosphatase. Total bilirubin is normal and AST and ALT are normal. His CBC is notable for very mild leukocytosis of 11.8.  He reports that he has had issues with nausea and vomiting for 2 months. I suspect that it is likely that his nausea and vomiting prevented him from taking his antihypertensives tonight which led to his current elevated blood pressure, although it is possible that his blood pressure had already gone up high enough that it is causing his nausea and vomiting. I ordered amlodipine 5 mg by mouth, metoprolol 25 mg by mouth, and clonidine 0.1 mg by mouth as per his discharge  instructions, as well as Zofran 4 mg IV.  I reviewed his medical record and see that he had an outpatient right upper quadrant ultrasound which showed some gallbladder sludge. This exam took place nearly 3 weeks ago. He has not had a recent CT scan. It is possible he is having biliary colic which is leading to the nausea and the vomiting and his inability to take his medications, but given his current presentation and the relatively recent ultrasound, I will proceed with a CT scan with IV contrast of the abdomen pelvis. He may need a repeat ultrasound. We will attempt blood pressure control with oral medications initially.     Clinical Course as of 06/14/20 0504  Tue Jun 14, 2020  0408 I have given the patient multiple hours since getting antihypertensives and unfortunately his blood pressure is still 170/100.  His nausea has improved but still present.  He still does not feel right.  His CT scan is notable for worsening anasarca compared to his previous exam.  I asked the patient if he thought he could tolerate  being discharged and follow-up as an outpatient and he does not think so.  He failed outpatient treatment and I will consult the hospitalist for admission.  For his persistent hypertension I am giving a dose of labetalol 10 mg IV and will give a dose of Lasix given his volume overload/anasarca and his inability to take his home Lasix. [CF]  0501 Discussed case with Dr. Damita Dunnings with the hospitalist service who will admit. [CF]  0503 Blood pressure still elevated at about 170/100.  Providing additional dose of labetalol 10 mg IV. [CF]    Clinical Course User Index [CF] Hinda Kehr, MD     ____________________________________________  FINAL CLINICAL IMPRESSION(S) / ED DIAGNOSES  Final diagnoses:  None     MEDICATIONS GIVEN DURING THIS VISIT:  Medications  metoprolol tartrate (LOPRESSOR) tablet 25 mg (25 mg Oral Given 06/14/20 0107)  cloNIDine (CATAPRES) tablet 0.1 mg (0.1 mg Oral Given 06/14/20 0107)  amLODipine (NORVASC) tablet 5 mg (5 mg Oral Given 06/14/20 0107)  ondansetron (ZOFRAN) injection 4 mg (4 mg Intravenous Given 06/14/20 0129)     ED Discharge Orders    None      *Please note:  Bradley Lopez was evaluated in Emergency Department on 06/14/2020 for the symptoms described in the history of present illness. He was evaluated in the context of the global COVID-19 pandemic, which necessitated consideration that the patient might be at risk for infection with the SARS-CoV-2 virus that causes COVID-19. Institutional protocols and algorithms that pertain to the evaluation of patients at risk for COVID-19 are in a state of rapid change based on information released by regulatory bodies including the CDC and federal and state organizations. These policies and algorithms were followed during the patient's care in the ED.  Some ED evaluations and interventions may be delayed as a result of limited staffing during and after the pandemic.*  Note:  This document was prepared using Dragon voice  recognition software and may include unintentional dictation errors.   Hinda Kehr, MD 06/14/20 [phone]

## 2020-06-14 NOTE — Progress Notes (Signed)
Patient admitted to room 235. VSS. Patient alerted/oriented x4. Visual disturbances noted; patient declined bed alarm, education provided.

## 2020-06-14 NOTE — Progress Notes (Signed)
[place], [place] 06/14/20  Subjective:  Bradley Lopez is a 45 y.o. is a african [place] male with PMHX of type 2 diabetes, poorly controlled hypertension. 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 He presented to ED with nausea and vomiting.   2/22-Re-admitted today due to nausea and vmiting at home last night. Drank a 16 oz water bootle and ate 3/4 of grille dchicken and some broccoli. Vomited 6-7 times. Was not able to take meds. Returned to ER for evaluation BP 207/16 at arrival. 178/102 now. Taking in clears. No n/v this morning.     Objective:  Vital signs in last 24 hours:  Temp:  [98.3 F (36.8 C)-98.4 F (36.9 C)] 98.3 F (36.8 C) (02/22 0521) Pulse Rate:  [70-103] 92 (02/22 0900) Resp:  [9-18] 13 (02/22 0900) BP: (145-208)/(82-122) 173/106 (02/22 0919) SpO2:  [93 %-100 %] 97 % (02/22 0900) Weight:  [102.1 kg] 102.1 kg (02/21 2011)  Weight change:  Filed Weights   06/13/20 2011  Weight: 102.1 kg    Intake/Output:    Intake/Output Summary (Last 24 hours) at 06/14/2020 0955 Last data filed at 06/14/2020 0738 Gross per 24 hour  Intake -  Output 500 ml  Net -500 ml    Physical Exam: General:  No acute distress, laying in the bed  HEENT  anicteric, moist oral mucous membrane  Pulm/lungs  normal breathing effort, lungs are clear to auscultation  CVS/Heart  regular rhythm and rate  Abdomen:   Soft, nontender  Extremities:  ++ peripheral edema upto mid abdomen  Neurologic:  Alert, oriented, able to follow commands  Skin:  No acute rashes     Basic Metabolic Panel:  Recent Labs  Lab 06/10/20 1029 06/10/20 1815 06/11/20 0511 06/13/20 0857 06/13/20 2017  NA 141  --  139 135 135  K 3.8  --  3.3* 3.6 3.6  CL 105  --  104 100 101  CO2 28  --  '27 27 27  '$ GLUCOSE 182*  --  167* 180* 146*  BUN 19  --  '19 18 19  '$ CREATININE 1.63*  --  1.63* 1.48* 1.54*  CALCIUM 8.6*  --   8.2* 8.2* 8.1*  MG 1.8 1.5* 1.8  --   --      CBC: Recent Labs  Lab 06/10/20 1029 06/11/20 0511 06/13/20 0857 06/13/20 2017  WBC 10.7* 9.4 8.5 11.8*  NEUTROABS 7.8*  --   --   --   HGB 10.2* 8.9* 9.6* 9.9*  HCT 30.9* 26.7* 28.7* 30.4*  MCV 86.1 87.0 85.4 85.9  PLT 446* 385 390 415*      Lab Results  Component Value Date   HEPBSAG NON REACTIVE 06/11/2020   HEPBSAB Reactive (A) 06/11/2020      Microbiology:  Recent Results (from the past 240 hour(s))  Resp Panel by RT-PCR (Flu A&B, Covid) Nasopharyngeal Swab     Status: None   Collection Time: 06/10/20 12:14 PM   Specimen: Nasopharyngeal Swab; Nasopharyngeal(NP) swabs in vial transport medium  Result Value Ref Range Status   SARS Coronavirus 2 by RT PCR NEGATIVE NEGATIVE Final    Comment: (NOTE) SARS-CoV-2 target nucleic acids are NOT DETECTED.  The SARS-CoV-2 RNA is generally detectable in upper respiratory specimens during the acute phase of infection. The lowest concentration of SARS-CoV-2 viral copies this assay can detect is 138 copies/mL. A negative result does not preclude SARS-Cov-2 infection and should not be used as  the sole basis for treatment or other patient management decisions. A negative result may occur with  improper specimen collection/handling, submission of specimen other than nasopharyngeal swab, presence of viral mutation(s) within the areas targeted by this assay, and inadequate number of viral copies(<138 copies/mL). A negative result must be combined with clinical observations, patient history, and epidemiological information. The expected result is Negative.  Fact Sheet for Patients:  EntrepreneurPulse.com.au  Fact Sheet for Healthcare Providers:  IncredibleEmployment.be  This test is no t yet approved or cleared by the [place] FDA and  has been authorized for detection and/or diagnosis of SARS-CoV-2 by FDA under an Emergency Use  Authorization (EUA). This EUA will remain  in effect (meaning this test can be used) for the duration of the COVID-19 declaration under Section 564(b)(1) of the Act, 21 U.S.C.section 360bbb-3(b)(1), unless the authorization is terminated  or revoked sooner.       Influenza A by PCR NEGATIVE NEGATIVE Final   Influenza B by PCR NEGATIVE NEGATIVE Final    Comment: (NOTE) The Xpert Xpress SARS-CoV-2/FLU/RSV plus assay is intended as an aid in the diagnosis of influenza from Nasopharyngeal swab specimens and should not be used as a sole basis for treatment. Nasal washings and aspirates are unacceptable for Xpert Xpress SARS-CoV-2/FLU/RSV testing.  Fact Sheet for Patients: EntrepreneurPulse.com.au  Fact Sheet for Healthcare Providers: IncredibleEmployment.be  This test is not yet approved or cleared by the [place] FDA and has been authorized for detection and/or diagnosis of SARS-CoV-2 by FDA under an Emergency Use Authorization (EUA). This EUA will remain in effect (meaning this test can be used) for the duration of the COVID-19 declaration under Section 564(b)(1) of the Act, 21 U.S.C. section 360bbb-3(b)(1), unless the authorization is terminated or revoked.  Performed at [facility], [address] [place], [place] [address]     Coagulation Studies: No results for input(s): LABPROT, INR in the last 72 hours.  Urinalysis: No results for input(s): COLORURINE, LABSPEC, PHURINE, GLUCOSEU, HGBUR, BILIRUBINUR, KETONESUR, PROTEINUR, UROBILINOGEN, NITRITE, LEUKOCYTESUR in the last 72 hours.  Invalid input(s): APPERANCEUR    Imaging: CT ABDOMEN PELVIS W CONTRAST  Result Date: 06/14/2020 CLINICAL DATA:  Vomiting and mid abdominal pain. EXAM: CT ABDOMEN AND PELVIS WITH CONTRAST TECHNIQUE: Multidetector CT imaging of the abdomen and pelvis was performed using the standard protocol following bolus administration of intravenous  contrast. CONTRAST:  112m OMNIPAQUE IOHEXOL 300 MG/ML  SOLN COMPARISON:  May 09, 2020 FINDINGS: Lower chest: Mild areas of atelectasis and/or infiltrate are seen within the bilateral lung bases. Small to moderate size bilateral pleural effusions are noted. These are mildly increased in severity when compared to the prior study. Hepatobiliary: No focal liver abnormality is seen. No gallstones, gallbladder wall thickening, or biliary dilatation. Pancreas: Unremarkable. No pancreatic ductal dilatation or surrounding inflammatory changes. Spleen: Normal in size without focal abnormality. Adrenals/Urinary Tract: Adrenal glands are unremarkable. Kidneys are normal, without renal calculi, focal lesion, or hydronephrosis. Bladder is unremarkable. Stomach/Bowel: Stomach is within normal limits. Appendix appears normal. No evidence of bowel wall thickening, distention, or inflammatory changes. Vascular/Lymphatic: No significant vascular findings are present. No enlarged abdominal or pelvic lymph nodes. Reproductive: Prostate is unremarkable. Other: Diffuse moderate severity anasarca of the abdominal and pelvic walls is seen. This is mildly increased in severity when compared to the prior study. No abdominopelvic ascites. Musculoskeletal: No acute or significant osseous findings. IMPRESSION: 1. Mild bibasilar atelectasis and/or infiltrate. 2. Small to moderate size bilateral pleural effusions, mildly increased in  severity when compared to the prior study. 3. Diffuse moderate severity anasarca, mildly increased in severity when compared to the prior study. Electronically Signed   By: Virgina Norfolk M.D.   On: 06/14/2020 02:36     Medications:   . sodium chloride     . amLODipine  10 mg Oral Daily  . atorvastatin  40 mg Oral QHS  . cloNIDine  0.1 mg Oral BID  . furosemide  40 mg Oral Daily  . insulin aspart  0-15 Units Subcutaneous TID WC  . lisinopril  20 mg Oral Daily  . metoCLOPramide  5 mg Oral TID AC   . rivaroxaban  20 mg Oral Q supper  . sodium chloride flush  3 mL Intravenous Q12H   sodium chloride, ondansetron **OR** ondansetron (ZOFRAN) IV, sodium chloride flush  Assessment/ Plan:  45 y.o. 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 Anemia of Chronic kidney disease  Lab Results  Component Value Date   HGB 9.9 (L) 06/13/2020  Decreased since admission Will monitor this level.    #. Diabetes type 2 with CKD Hgb A1c MFr Bld (%)  Date Value  06/11/2020 10.7 (H)  Discussed need for stricter blood sugar controls  # LE edema and uncontrolled HTN See above Lasix    LOS: 0 Jaxon Mynhier 2/22/20229:55 AM  [place], [place]

## 2020-06-14 NOTE — H&P (Signed)
History and Physical    Bradley Lopez [id number] DOB: 03-Aug-1975 DOA: 06/14/2020  PCP: Baxter Hire, MD   Patient coming from: Home  I have personally briefly reviewed patient's old medical records in [place]  Chief Complaint: Emesis  HPI: Bradley Lopez is a 45 y.o. male with medical history significant for hypertension, nephrotic syndrome, diabetes mellitus with complications of stage III chronic kidney disease who was recently discharged from the hospital on 06/13/20 after treatment for hypertensive emergency.  Patient was stable at discharge, able to tolerate oral intake with improved blood pressure but states that her son as he got home and had dinner which consisted of chicken and broccoli he became nauseous and started throwing up.  He was unable to take his blood pressure medication and so returned to the emergency room.  Upon arrival to the ER his blood pressure was elevated at [id number] systolic.  Patient complains of abdominal pain mostly in the periumbilical area associated with nausea and vomiting which he has had intermittently for about 2 months.  He denies having any changes in his bowel habits.  He has bilateral lower extremity swelling. He denies having any fever, no chills, no sore throat, no chest pain, no shortness of breath, no diarrhea, no urinary frequency, no nocturia, no dysuria, no headache, no blurred vision, no sore throat, no cough, no palpitations or diaphoresis Labs show sodium 135, potassium 3.6, chloride 101, bicarb 27, glucose 146, BUN 19, creatinine 1.54, calcium 8.1, alkaline phosphatase 277, albumin 2.3, lipase 18, AST 36, ALT 26, total protein 6.1, white count 11.8, hemoglobin 9.9, hematocrit 30.4, MCV 85.9, RDW 13.1, platelet count 415 His SARS coronavirus 2 point-of-care test is still pending CT scan of abdomen pelvis shows mild bibasilar atelectasis and/or infiltrate.Small to moderate size bilateral pleural effusions, mildly increased in severity when  compared to the prior study. Diffuse moderate severity anasarca, mildly increased in severity when compared to the prior study. Twelve-lead EKG reviewed by me shows normal sinus rhythm.  Anterior infarct age undetermined.  ED Course: Patient is a 45 year old African-American male who returns to the emergency room a couple of hours post his discharge with complaints of nausea, vomiting and periumbilical pain as well as elevated blood pressures.  Patient's blood pressure still remain uncontrolled.  He will be referred to observation status for further evaluation.  Review of Systems: As per HPI otherwise all other systems reviewed and negative.    Past Medical History:  Diagnosis Date  . Asthma   . Diabetes mellitus without complication ([place])   . Hypertension     History reviewed. No pertinent surgical history.   reports that he has never smoked. He has never used smokeless tobacco. He reports previous alcohol use. He reports previous drug use.  Allergies  Allergen Reactions  . Pine Hives and Itching    Family History  Problem Relation Age of Onset  . Hypertension Mother       Prior to Admission medications   Medication Sig Start Date End Date Taking? Authorizing Provider  amLODipine (NORVASC) 5 MG tablet Take 1 tablet (5 mg total) by mouth daily. 06/14/20 07/14/20  Max Sane, MD  atorvastatin (LIPITOR) 40 MG tablet Take 40 mg by mouth at bedtime. 05/17/20   [provider]  cloNIDine (CATAPRES) 0.1 MG tablet Take 1 tablet (0.1 mg total) by mouth 2 (two) times daily. 06/13/20 07/13/20  Max Sane, MD  furosemide (LASIX) 40 MG tablet Take 40 mg by mouth daily. 06/07/20  [provider]  glipiZIDE (GLUCOTROL XL) 2.5 MG 24 hr tablet Take 2.5 mg by mouth daily. 05/17/20   [provider]  lisinopril (ZESTRIL) 20 MG tablet Take 20 mg by mouth daily. 05/17/20   [provider]  metoprolol tartrate (LOPRESSOR) 25 MG tablet Take 1 tablet (25 mg total) by  mouth 2 (two) times daily. 06/13/20 07/13/20  Max Sane, MD  rivaroxaban (XARELTO) 20 MG TABS tablet Take 1 tablet (20 mg total) by mouth daily with supper. 05/24/20   Kris Hartmann, NP    Physical Exam: Vitals:   06/14/20 0531 06/14/20 0613 06/14/20 0737 06/14/20 0745  BP: (!) 152/98 (!) 169/97 (!) 180/106 (!) 163/106  Pulse: 96 94 92 93  Resp: '13 15 16 14  '$ Temp:      TempSrc:      SpO2: 97% 98% 95% 96%  Weight:      Height:         Vitals:   06/14/20 0531 06/14/20 0613 06/14/20 0737 06/14/20 0745  BP: (!) 152/98 (!) 169/97 (!) 180/106 (!) 163/106  Pulse: 96 94 92 93  Resp: '13 15 16 14  '$ Temp:      TempSrc:      SpO2: 97% 98% 95% 96%  Weight:      Height:          Constitutional: Alert and oriented x 3. Not in any apparent distress HEENT:      Head: Normocephalic and atraumatic.         Eyes: PERLA, EOMI, Conjunctivae are normal. Sclera is non-icteric.       Mouth/Throat:  Dry mucous membranes .       Neck: Supple with no signs of meningismus. Cardiovascular: Regular rate and rhythm. No murmurs, gallops, or rubs. 2+ symmetrical distal pulses are present . No JVD. 2+ LE edema Respiratory: Respiratory effort normal .  Bilateral air entry. Faint crackles at the bases Gastrointestinal: Soft, periumbilical tenderness, and non distended with positive bowel sounds.  Genitourinary: No CVA tenderness. Musculoskeletal: Nontender with normal range of motion in all extremities. No cyanosis, or erythema of extremities. Neurologic:  Face is symmetric. Moving all extremities. No gross focal neurologic deficits  Skin: Skin is warm, dry.  No rash or ulcers Psychiatric: Mood and affect are normal   Labs on Admission: I have personally reviewed following labs and imaging studies  CBC: Recent Labs  Lab 06/10/20 1029 06/11/20 0511 06/13/20 0857 06/13/20 2017  WBC 10.7* 9.4 8.5 11.8*  NEUTROABS 7.8*  --   --   --   HGB 10.2* 8.9* 9.6* 9.9*  HCT 30.9* 26.7* 28.7* 30.4*  MCV 86.1  87.0 85.4 85.9  PLT 446* 385 390 [id number]*   Basic Metabolic Panel: Recent Labs  Lab 06/10/20 1029 06/10/20 1815 06/11/20 0511 06/13/20 0857 06/13/20 2017  NA 141  --  139 135 135  K 3.8  --  3.3* 3.6 3.6  CL 105  --  104 100 101  CO2 28  --  '27 27 27  '$ GLUCOSE 182*  --  167* 180* 146*  BUN 19  --  '19 18 19  '$ CREATININE 1.63*  --  1.63* 1.48* 1.54*  CALCIUM 8.6*  --  8.2* 8.2* 8.1*  MG 1.8 1.5* 1.8  --   --    GFR: Estimated Creatinine Clearance: 80 mL/min (A) (by C-G formula based on SCr of 1.54 mg/dL (H)). Liver Function Tests: Recent Labs  Lab 06/10/20 1029 06/11/20 0511 06/13/20 2017  AST 48* 27 36  ALT '30 21 26  '$ ALKPHOS 332* 247* 277*  BILITOT 0.5 0.6 0.8  PROT 6.3* 5.3* 6.1*  ALBUMIN 2.3* 1.9* 2.3*   Recent Labs  Lab 06/10/20 1029 06/13/20 2017  LIPASE 19 18   No results for input(s): AMMONIA in the last 168 hours. Coagulation Profile: Recent Labs  Lab 06/11/20 0511  INR 1.5*   Cardiac Enzymes: No results for input(s): CKTOTAL, CKMB, CKMBINDEX, TROPONINI in the last 168 hours. BNP (last 3 results) No results for input(s): PROBNP in the last 8760 hours. HbA1C: No results for input(s): HGBA1C in the last 72 hours. CBG: Recent Labs  Lab 06/12/20 2036 06/13/20 0737 06/13/20 1211 06/13/20 2020 06/14/20 0814  GLUCAP 155* 160* 196* 139* 138*   Lipid Profile: No results for input(s): CHOL, HDL, LDLCALC, TRIG, CHOLHDL, LDLDIRECT in the last 72 hours. Thyroid Function Tests: No results for input(s): TSH, T4TOTAL, FREET4, T3FREE, THYROIDAB in the last 72 hours. Anemia Panel: No results for input(s): VITAMINB12, FOLATE, FERRITIN, TIBC, IRON, RETICCTPCT in the last 72 hours. Urine analysis:    Component Value Date/Time   COLORURINE YELLOW (A) 06/10/2020 1411   APPEARANCEUR CLOUDY (A) 06/10/2020 1411   LABSPEC 1.010 06/10/2020 1411   PHURINE 7.0 06/10/2020 1411   GLUCOSEU 50 (A) 06/10/2020 1411   HGBUR MODERATE (A) 06/10/2020 1411   BILIRUBINUR  NEGATIVE 06/10/2020 1411   KETONESUR NEGATIVE 06/10/2020 1411   PROTEINUR >=300 (A) 06/10/2020 1411   NITRITE NEGATIVE 06/10/2020 1411   LEUKOCYTESUR MODERATE (A) 06/10/2020 1411    Radiological Exams on Admission: CT ABDOMEN PELVIS W CONTRAST  Result Date: 06/14/2020 CLINICAL DATA:  Vomiting and mid abdominal pain. EXAM: CT ABDOMEN AND PELVIS WITH CONTRAST TECHNIQUE: Multidetector CT imaging of the abdomen and pelvis was performed using the standard protocol following bolus administration of intravenous contrast. CONTRAST:  167m OMNIPAQUE IOHEXOL 300 MG/ML  SOLN COMPARISON:  May 09, 2020 FINDINGS: Lower chest: Mild areas of atelectasis and/or infiltrate are seen within the bilateral lung bases. Small to moderate size bilateral pleural effusions are noted. These are mildly increased in severity when compared to the prior study. Hepatobiliary: No focal liver abnormality is seen. No gallstones, gallbladder wall thickening, or biliary dilatation. Pancreas: Unremarkable. No pancreatic ductal dilatation or surrounding inflammatory changes. Spleen: Normal in size without focal abnormality. Adrenals/Urinary Tract: Adrenal glands are unremarkable. Kidneys are normal, without renal calculi, focal lesion, or hydronephrosis. Bladder is unremarkable. Stomach/Bowel: Stomach is within normal limits. Appendix appears normal. No evidence of bowel wall thickening, distention, or inflammatory changes. Vascular/Lymphatic: No significant vascular findings are present. No enlarged abdominal or pelvic lymph nodes. Reproductive: Prostate is unremarkable. Other: Diffuse moderate severity anasarca of the abdominal and pelvic walls is seen. This is mildly increased in severity when compared to the prior study. No abdominopelvic ascites. Musculoskeletal: No acute or significant osseous findings. IMPRESSION: 1. Mild bibasilar atelectasis and/or infiltrate. 2. Small to moderate size bilateral pleural effusions, mildly increased  in severity when compared to the prior study. 3. Diffuse moderate severity anasarca, mildly increased in severity when compared to the prior study. Electronically Signed   By: TVirgina NorfolkM.D.   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  Rebound hypertension due to clonidine Continue clonidine 0.1 mg p.o. 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 Start patient on clear liquids and advance as tolerated Place patient on Reglan AC meals and at bedtime Hold oral hypoglycemic agent Glycemic control with sliding scale insulin Monitor renal function closely    Nephrotic syndrome and diabetes Patient continues to have anasarca but improved from his last hospitalization Continue furosemide and statins Continue lisinopril We will request nephrology consult    History of DVT Continue Xarelto    Anemia due to chronic kidney disease Monitor H&H closely   DVT prophylaxis: Xarelto Code Status: full code Family Communication: Greater than 50% of time was spent discussing patient's condition and plan of care with him at the bedside.  All questions and concerns have been addressed.  He verbalized agreement the plan. Disposition  Plan: Back to previous home environment Consults called: Nephrology Status: Observation    Hero Mccathern MD [facility]     06/14/2020, 8:31 AM

## 2020-06-15 DIAGNOSIS — K3184 Gastroparesis: Secondary | ICD-10-CM | POA: Diagnosis present

## 2020-06-15 DIAGNOSIS — I160 Hypertensive urgency: Secondary | ICD-10-CM | POA: Diagnosis not present

## 2020-06-15 DIAGNOSIS — N1831 Chronic kidney disease, stage 3a: Secondary | ICD-10-CM

## 2020-06-15 DIAGNOSIS — D500 Iron deficiency anemia secondary to blood loss (chronic): Secondary | ICD-10-CM | POA: Diagnosis present

## 2020-06-15 DIAGNOSIS — Z8672 Personal history of thrombophlebitis: Secondary | ICD-10-CM | POA: Diagnosis not present

## 2020-06-15 DIAGNOSIS — N049 Nephrotic syndrome with unspecified morphologic changes: Secondary | ICD-10-CM | POA: Diagnosis present

## 2020-06-15 DIAGNOSIS — Z20822 Contact with and (suspected) exposure to covid-19: Secondary | ICD-10-CM | POA: Diagnosis present

## 2020-06-15 DIAGNOSIS — Z91048 Other nonmedicinal substance allergy status: Secondary | ICD-10-CM | POA: Diagnosis not present

## 2020-06-15 DIAGNOSIS — Z86718 Personal history of other venous thrombosis and embolism: Secondary | ICD-10-CM | POA: Diagnosis not present

## 2020-06-15 DIAGNOSIS — E1122 Type 2 diabetes mellitus with diabetic chronic kidney disease: Secondary | ICD-10-CM | POA: Diagnosis present

## 2020-06-15 DIAGNOSIS — Z7984 Long term (current) use of oral hypoglycemic drugs: Secondary | ICD-10-CM | POA: Diagnosis not present

## 2020-06-15 DIAGNOSIS — J45909 Unspecified asthma, uncomplicated: Secondary | ICD-10-CM | POA: Diagnosis present

## 2020-06-15 DIAGNOSIS — D631 Anemia in chronic kidney disease: Secondary | ICD-10-CM | POA: Diagnosis present

## 2020-06-15 DIAGNOSIS — I82413 Acute embolism and thrombosis of femoral vein, bilateral: Secondary | ICD-10-CM

## 2020-06-15 DIAGNOSIS — I161 Hypertensive emergency: Secondary | ICD-10-CM | POA: Diagnosis present

## 2020-06-15 DIAGNOSIS — E1121 Type 2 diabetes mellitus with diabetic nephropathy: Secondary | ICD-10-CM | POA: Diagnosis not present

## 2020-06-15 DIAGNOSIS — Z79899 Other long term (current) drug therapy: Secondary | ICD-10-CM | POA: Diagnosis not present

## 2020-06-15 DIAGNOSIS — Z8249 Family history of ischemic heart disease and other diseases of the circulatory system: Secondary | ICD-10-CM | POA: Diagnosis not present

## 2020-06-15 DIAGNOSIS — R601 Generalized edema: Secondary | ICD-10-CM | POA: Diagnosis present

## 2020-06-15 DIAGNOSIS — N170 Acute kidney failure with tubular necrosis: Secondary | ICD-10-CM | POA: Diagnosis present

## 2020-06-15 DIAGNOSIS — E876 Hypokalemia: Secondary | ICD-10-CM | POA: Diagnosis present

## 2020-06-15 DIAGNOSIS — I129 Hypertensive chronic kidney disease with stage 1 through stage 4 chronic kidney disease, or unspecified chronic kidney disease: Secondary | ICD-10-CM | POA: Diagnosis present

## 2020-06-15 DIAGNOSIS — F411 Generalized anxiety disorder: Secondary | ICD-10-CM | POA: Diagnosis present

## 2020-06-15 DIAGNOSIS — Z7902 Long term (current) use of antithrombotics/antiplatelets: Secondary | ICD-10-CM | POA: Diagnosis not present

## 2020-06-15 DIAGNOSIS — E1165 Type 2 diabetes mellitus with hyperglycemia: Secondary | ICD-10-CM | POA: Diagnosis present

## 2020-06-15 DIAGNOSIS — E1143 Type 2 diabetes mellitus with diabetic autonomic (poly)neuropathy: Secondary | ICD-10-CM | POA: Diagnosis present

## 2020-06-15 LAB — BASIC METABOLIC PANEL
Anion gap: 6 (ref 5–15)
BUN: 19 mg/dL (ref 6–20)
CO2: 30 mmol/L (ref 22–32)
Calcium: 7.8 mg/dL — ABNORMAL LOW (ref 8.9–10.3)
Chloride: 101 mmol/L (ref 98–111)
Creatinine, Ser: 1.82 mg/dL — ABNORMAL HIGH (ref 0.61–1.24)
GFR, Estimated: 47 mL/min — ABNORMAL LOW (ref >60)
Glucose, Bld: 104 mg/dL — ABNORMAL HIGH (ref 70–99)
Potassium: 3.3 mmol/L — ABNORMAL LOW (ref 3.5–5.1)
Sodium: 137 mmol/L (ref 135–145)

## 2020-06-15 LAB — IRON AND TIBC
Iron: 17 ug/dL — ABNORMAL LOW (ref 45–182)
Saturation Ratios: 12 % — ABNORMAL LOW (ref 17.9–39.5)
TIBC: 140 ug/dL — ABNORMAL LOW (ref 250–450)
UIBC: 123 ug/dL

## 2020-06-15 LAB — PROTEIN ELECTROPHORESIS, SERUM
A/G Ratio: 0.7 (ref 0.7–1.7)
Albumin ELP: 2 g/dL — ABNORMAL LOW (ref 2.9–4.4)
Alpha-1-Globulin: 0.3 g/dL (ref 0.0–0.4)
Alpha-2-Globulin: 0.7 g/dL (ref 0.4–1.0)
Beta Globulin: 0.9 g/dL (ref 0.7–1.3)
Gamma Globulin: 0.8 g/dL (ref 0.4–1.8)
Globulin, Total: 2.8 g/dL (ref 2.2–3.9)
Total Protein ELP: 4.8 g/dL — ABNORMAL LOW (ref 6.0–8.5)

## 2020-06-15 LAB — GLUCOSE, CAPILLARY
Glucose-Capillary: 123 mg/dL — ABNORMAL HIGH (ref 70–99)
Glucose-Capillary: 122 mg/dL — ABNORMAL HIGH (ref 70–99)
Glucose-Capillary: 127 mg/dL — ABNORMAL HIGH (ref 70–99)
Glucose-Capillary: 120 mg/dL — ABNORMAL HIGH (ref 70–99)

## 2020-06-15 LAB — CBC
HCT: 25.4 % — ABNORMAL LOW (ref 39.0–52.0)
Hemoglobin: 8.5 g/dL — ABNORMAL LOW (ref 13.0–17.0)
MCH: 28.4 pg (ref 26.0–34.0)
MCHC: 33.5 g/dL (ref 30.0–36.0)
MCV: 84.9 fL (ref 80.0–100.0)
Platelets: 373 10*3/uL (ref 150–400)
RBC: 2.99 MIL/uL — ABNORMAL LOW (ref 4.22–5.81)
RDW: 13.1 % (ref 11.5–15.5)
WBC: 7.2 10*3/uL (ref 4.0–10.5)
nRBC: 0 % (ref 0.0–0.2)

## 2020-06-15 LAB — VITAMIN B12: Vitamin B-12: 343 pg/mL (ref 180–914)

## 2020-06-15 MED ORDER — CLONIDINE HCL 0.1 MG PO TABS
0.1000 mg | ORAL_TABLET | Freq: Two times a day (BID) | ORAL | Status: AC
  Administered 2020-06-15 – 2020-06-16 (×3): 0.1 mg via ORAL
  Filled 2020-06-15 (×3): qty 1

## 2020-06-15 MED ORDER — SODIUM CHLORIDE 0.9 % IV SOLN
300.0000 mg | Freq: Once | INTRAVENOUS | Status: AC
  Administered 2020-06-15: 300 mg via INTRAVENOUS
  Filled 2020-06-15: qty 15

## 2020-06-15 MED ORDER — POTASSIUM CHLORIDE 20 MEQ PO PACK
40.0000 meq | PACK | ORAL | Status: DC
  Filled 2020-06-15: qty 2

## 2020-06-15 MED ORDER — KCL-LACTATED RINGERS 20 MEQ/L IV SOLN
INTRAVENOUS | Status: DC
  Filled 2020-06-15 (×2): qty 1000

## 2020-06-15 MED ORDER — POTASSIUM CHLORIDE 20 MEQ PO PACK
40.0000 meq | PACK | Freq: Once | ORAL | Status: AC
  Administered 2020-06-15: 40 meq via ORAL

## 2020-06-15 MED ORDER — POTASSIUM CHLORIDE 2 MEQ/ML IV SOLN
INTRAVENOUS | Status: DC
  Filled 2020-06-15 (×5): qty 1000

## 2020-06-15 NOTE — Progress Notes (Signed)
[place], [place] 06/15/20  Subjective:  Bradley Lopez is a 45 y.o. is a african [place] male with PMHX of type 2 diabetes, poorly controlled hypertension. 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 He presented to ED with nausea and vomiting.   2/22-Re-admitted today due to nausea and vmiting at home last night. Drank a 16 oz water bootle and ate 3/4 of grille dchicken and some broccoli. Vomited 6-7 times. Was not able to take meds. Returned to ER for evaluation BP 207/16 at arrival. 178/102 now. Taking in clears. No n/v this morning.   Patient seen today resting in bed watching tv Alert and oriented Denies vomiting since admission Says he is hungry and would like solid foods Says he doesn't know what caused his pressure to go up   Objective:  Vital signs in last 24 hours:  Temp:  [97.4 F (36.3 C)-98.4 F (36.9 C)] 97.6 F (36.4 C) (02/23 1215) Pulse Rate:  [77-87] 80 (02/23 1215) Resp:  [12-19] 18 (02/23 1215) BP: (112-170)/(80-96) 143/91 (02/23 1215) SpO2:  [96 %-100 %] 100 % (02/23 1215) Weight:  [99.8 kg-100.2 kg] 100.2 kg (02/23 0331)  Weight change: -2.268 kg Filed Weights   06/13/20 2011 06/14/20 1611 06/15/20 0331  Weight: 102.1 kg 99.8 kg 100.2 kg    Intake/Output:   No intake or output data in the 24 hours ending 06/15/20 1349  Physical Exam: General:  No acute distress, laying in the bed  HEENT  anicteric, moist oral mucous membrane  Pulm/lungs  normal breathing effort, lungs are clear to auscultation  CVS/Heart  regular rhythm and rate  Abdomen:   Soft, nontender  Extremities:  ++ peripheral edema upto mid abdomen  Neurologic:  Alert, oriented, able to follow commands  Skin:  No acute rashes     Basic Metabolic Panel:  Recent Labs  Lab 06/10/20 1029 06/10/20 1815 06/11/20 0511 06/13/20 0857 06/13/20 2017 06/15/20 0416  NA 141  --  139 135 135 137  K  3.8  --  3.3* 3.6 3.6 3.3*  CL 105  --  104 100 101 101  CO2 28  --  '27 27 27 30  '$ GLUCOSE 182*  --  167* 180* 146* 104*  BUN 19  --  '19 18 19 19  '$ CREATININE 1.63*  --  1.63* 1.48* 1.54* 1.82*  CALCIUM 8.6*  --  8.2* 8.2* 8.1* 7.8*  MG 1.8 1.5* 1.8  --   --   --      CBC: Recent Labs  Lab 06/10/20 1029 06/11/20 0511 06/13/20 0857 06/13/20 2017 06/15/20 0416  WBC 10.7* 9.4 8.5 11.8* 7.2  NEUTROABS 7.8*  --   --   --   --   HGB 10.2* 8.9* 9.6* 9.9* 8.5*  HCT 30.9* 26.7* 28.7* 30.4* 25.4*  MCV 86.1 87.0 85.4 85.9 84.9  PLT 446* 385 390 415* 373      Lab Results  Component Value Date   HEPBSAG NON REACTIVE 06/11/2020   HEPBSAB Reactive (A) 06/11/2020      Microbiology:  Recent Results (from the past 240 hour(s))  Resp Panel by RT-PCR (Flu A&B, Covid) Nasopharyngeal Swab     Status: None   Collection Time: 06/10/20 12:14 PM   Specimen: Nasopharyngeal Swab; Nasopharyngeal(NP) swabs in vial transport medium  Result Value Ref Range Status   SARS Coronavirus 2 by RT PCR NEGATIVE NEGATIVE Final    Comment: (NOTE) SARS-CoV-2 target nucleic acids  are NOT DETECTED.  The SARS-CoV-2 RNA is generally detectable in upper respiratory specimens during the acute phase of infection. The lowest concentration of SARS-CoV-2 viral copies this assay can detect is 138 copies/mL. A negative result does not preclude SARS-Cov-2 infection and should not be used as the sole basis for treatment or other patient management decisions. A negative result may occur with  improper specimen collection/handling, submission of specimen other than nasopharyngeal swab, presence of viral mutation(s) within the areas targeted by this assay, and inadequate number of viral copies(<138 copies/mL). A negative result must be combined with clinical observations, patient history, and epidemiological information. The expected result is Negative.  Fact Sheet for Patients:   EntrepreneurPulse.com.au  Fact Sheet for Healthcare Providers:  IncredibleEmployment.be  This test is no t yet approved or cleared by the [place] FDA and  has been authorized for detection and/or diagnosis of SARS-CoV-2 by FDA under an Emergency Use Authorization (EUA). This EUA will remain  in effect (meaning this test can be used) for the duration of the COVID-19 declaration under Section 564(b)(1) of the Act, 21 U.S.C.section 360bbb-3(b)(1), unless the authorization is terminated  or revoked sooner.       Influenza A by PCR NEGATIVE NEGATIVE Final   Influenza B by PCR NEGATIVE NEGATIVE Final    Comment: (NOTE) The Xpert Xpress SARS-CoV-2/FLU/RSV plus assay is intended as an aid in the diagnosis of influenza from Nasopharyngeal swab specimens and should not be used as a sole basis for treatment. Nasal washings and aspirates are unacceptable for Xpert Xpress SARS-CoV-2/FLU/RSV testing.  Fact Sheet for Patients: EntrepreneurPulse.com.au  Fact Sheet for Healthcare Providers: IncredibleEmployment.be  This test is not yet approved or cleared by the [place] FDA and has been authorized for detection and/or diagnosis of SARS-CoV-2 by FDA under an Emergency Use Authorization (EUA). This EUA will remain in effect (meaning this test can be used) for the duration of the COVID-19 declaration under Section 564(b)(1) of the Act, 21 U.S.C. section 360bbb-3(b)(1), unless the authorization is terminated or revoked.  Performed at [facility], [place], [place] [address]   SARS CORONAVIRUS 2 (TAT 6-24 HRS) Nasopharyngeal Nasopharyngeal Swab     Status: None   Collection Time: 06/14/20  5:27 AM   Specimen: Nasopharyngeal Swab  Result Value Ref Range Status   SARS Coronavirus 2 NEGATIVE NEGATIVE Final    Comment: (NOTE) SARS-CoV-2 target nucleic acids are NOT DETECTED.  The  SARS-CoV-2 RNA is generally detectable in upper and lower respiratory specimens during the acute phase of infection. Negative results do not preclude SARS-CoV-2 infection, do not rule out co-infections with other pathogens, and should not be used as the sole basis for treatment or other patient management decisions. Negative results must be combined with clinical observations, patient history, and epidemiological information. The expected result is Negative.  Fact Sheet for Patients: SugarRoll.be  Fact Sheet for Healthcare Providers: https://www.woods-mathews.com/  This test is not yet approved or cleared by the [place] FDA and  has been authorized for detection and/or diagnosis of SARS-CoV-2 by FDA under an Emergency Use Authorization (EUA). This EUA will remain  in effect (meaning this test can be used) for the duration of the COVID-19 declaration under Se ction 564(b)(1) of the Act, 21 U.S.C. section 360bbb-3(b)(1), unless the authorization is terminated or revoked sooner.  Performed at [facility], [place] [address], [place], [place] [address]     Coagulation Studies: No results for input(s): LABPROT, INR in  the last 72 hours.  Urinalysis: No results for input(s): COLORURINE, LABSPEC, PHURINE, GLUCOSEU, HGBUR, BILIRUBINUR, KETONESUR, PROTEINUR, UROBILINOGEN, NITRITE, LEUKOCYTESUR in the last 72 hours.  Invalid input(s): APPERANCEUR    Imaging: CT ABDOMEN PELVIS W CONTRAST  Result Date: 06/14/2020 CLINICAL DATA:  Vomiting and mid abdominal pain. EXAM: CT ABDOMEN AND PELVIS WITH CONTRAST TECHNIQUE: Multidetector CT imaging of the abdomen and pelvis was performed using the standard protocol following bolus administration of intravenous contrast. CONTRAST:  146m OMNIPAQUE IOHEXOL 300 MG/ML  SOLN COMPARISON:  May 09, 2020 FINDINGS: Lower chest: Mild areas of atelectasis and/or infiltrate are seen within the bilateral lung  bases. Small to moderate size bilateral pleural effusions are noted. These are mildly increased in severity when compared to the prior study. Hepatobiliary: No focal liver abnormality is seen. No gallstones, gallbladder wall thickening, or biliary dilatation. Pancreas: Unremarkable. No pancreatic ductal dilatation or surrounding inflammatory changes. Spleen: Normal in size without focal abnormality. Adrenals/Urinary Tract: Adrenal glands are unremarkable. Kidneys are normal, without renal calculi, focal lesion, or hydronephrosis. Bladder is unremarkable. Stomach/Bowel: Stomach is within normal limits. Appendix appears normal. No evidence of bowel wall thickening, distention, or inflammatory changes. Vascular/Lymphatic: No significant vascular findings are present. No enlarged abdominal or pelvic lymph nodes. Reproductive: Prostate is unremarkable. Other: Diffuse moderate severity anasarca of the abdominal and pelvic walls is seen. This is mildly increased in severity when compared to the prior study. No abdominopelvic ascites. Musculoskeletal: No acute or significant osseous findings. IMPRESSION: 1. Mild bibasilar atelectasis and/or infiltrate. 2. Small to moderate size bilateral pleural effusions, mildly increased in severity when compared to the prior study. 3. Diffuse moderate severity anasarca, mildly increased in severity when compared to the prior study. Electronically Signed   By: TVirgina NorfolkM.D.   On: 06/14/2020 02:36     Medications:   . sodium chloride    . lactated ringers with kcl 75 mL/hr at 06/15/20 1146   . amLODipine  10 mg Oral Daily  . atorvastatin  40 mg Oral QHS  . cloNIDine  0.2 mg Transdermal Weekly  . cloNIDine  0.1 mg Oral BID  . diclofenac Sodium  2 g Topical QID  . furosemide  40 mg Oral Daily  . insulin aspart  0-15 Units Subcutaneous TID WC  . lisinopril  40 mg Oral Daily  . metoCLOPramide  5 mg Oral TID AC  . rivaroxaban  20 mg Oral Q supper  . sodium chloride  flush  3 mL Intravenous Q12H   sodium chloride, acetaminophen, hydrALAZINE, ondansetron **OR** ondansetron (ZOFRAN) IV, sodium chloride flush  Assessment/ Plan:  45y.o. 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 Anemia of Chronic kidney disease  Lab Results  Component Value Date   HGB 8.5 (L) 06/15/2020  Decreased since admission Will monitor this level.    #. Diabetes  type 2 with CKD Hgb A1c MFr Bld (%)  Date Value  06/11/2020 10.7 (H)  Discussed need for stricter blood sugar controls  # LE edema and uncontrolled HTN See above Lasix    LOS: 0 Colon [place] 2/23/20221:49 PM  [place], [place]

## 2020-06-15 NOTE — Progress Notes (Signed)
PROGRESS NOTE    Bradley Lopez  [id number] DOB: 05/31/1975 DOA: 06/14/2020 PCP: Baxter Hire, MD   Follow-up on hypertension emergency Brief Narrative:  Bradley Lopez is a 45 y.o. male with medical history significant for hypertension, nephrotic syndrome, diabetes mellitus with complications of stage III chronic kidney disease who was recently discharged from the hospital on 06/13/20 after treatment for hypertensive emergency. Patient developed nausea vomiting, could not taking his blood pressure medicine.  Came back to the hospital with hypertension emergency.  We started on Reglan, nausea vomiting is better.   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  Hypertension urgency. This appears to be secondary to not able to tolerate blood pressure medicine due to nausea vomiting.  Condition has been improving.  Resumed all home medicines with increased dose of amlodipine and lisinopril.  Blood pressure improving.  2.  Type 2 diabetes with chronic kidney disease stage IIIa. Hypokalemia. Nephrotic syndrome. Diabetes gastroparesis. Patient treated with Reglan, nausea vomiting has improved.  Patient has slightly worsening renal function today, will give IV fluids for now. Potassium will be supplemented.  #3.  History of DVT. Continue Xarelto.  4.  Anemia of chronic kidney disease. Iron deficient anemia. Patient iron level is low, will give IV iron.     DVT prophylaxis: Xarelto Code Status: Full Family Communication:  Disposition Plan:  .   Status is: Observation  The patient will require care spanning > 2 midnights and should be moved to inpatient because: Inpatient level  of care appropriate due to severity of illness  Dispo: The patient is from: Home              Anticipated d/c is to: Home              Anticipated d/c date is: 1 day              Patient currently is not medically stable to d/c.   Difficult to place patient No        I/O last 3 completed shifts: In: -  Out: 500 [Urine:500] No intake/output data recorded.     Consultants:   Nephrology  Procedures: None  Antimicrobials: None  Subjective: Patient feel much better today.  No additional nausea vomiting. Denies any short of breath or cough. No abdominal pain or constipation, no diarrhea. No dysuria hematuria pain No fever or chills.  Objective: Vitals:   06/14/20 1611 06/14/20 1929 06/15/20 0331 06/15/20 0813  BP: 133/80 (!) 143/91 (!) 154/86 (!) 170/96  Pulse: 80 81 77 86  Resp: '18 18 16 18  '$ Temp: 97.6 F (36.4 C) 98.4 F (36.9 C) (!) 97.4 F (36.3 C) (!) 97.4 F (36.3 C)  TempSrc: Oral Axillary Axillary Oral  SpO2: 100% 100% 100% 100%  Weight: 99.8 kg  100.2 kg   Height:       No intake or output data in the 24 hours ending 06/15/20 1021 Filed Weights   06/13/20 2011 06/14/20 1611 06/15/20 0331  Weight: 102.1 kg 99.8 kg 100.2 kg    Examination:  General exam: Appears calm and comfortable  Respiratory system: Clear to auscultation. Respiratory effort normal. Cardiovascular system: S1 & S2  heard, RRR. No JVD, murmurs, rubs, gallops or clicks. No pedal edema. Gastrointestinal system: Abdomen is nondistended, soft and nontender. No organomegaly or masses felt. Normal bowel sounds heard. Central nervous system: Alert and oriented. No focal neurological deficits. Extremities: Symmetric 5 x 5 power. Skin: No rashes, lesions or ulcers Psychiatry: Judgement and insight appear normal. Mood & affect appropriate.     Data Reviewed: I have personally reviewed following labs and imaging studies  CBC: Recent Labs  Lab 06/10/20 1029 06/11/20 0511 06/13/20 0857  06/13/20 2017 06/15/20 0416  WBC 10.7* 9.4 8.5 11.8* 7.2  NEUTROABS 7.8*  --   --   --   --   HGB 10.2* 8.9* 9.6* 9.9* 8.5*  HCT 30.9* 26.7* 28.7* 30.4* 25.4*  MCV 86.1 87.0 85.4 85.9 84.9  PLT 446* 385 390 415* [id number]   Basic Metabolic Panel: Recent Labs  Lab 06/10/20 1029 06/10/20 1815 06/11/20 0511 06/13/20 0857 06/13/20 2017 06/15/20 0416  NA 141  --  139 135 135 137  K 3.8  --  3.3* 3.6 3.6 3.3*  CL 105  --  104 100 101 101  CO2 28  --  '27 27 27 30  '$ GLUCOSE 182*  --  167* 180* 146* 104*  BUN 19  --  '19 18 19 19  '$ CREATININE 1.63*  --  1.63* 1.48* 1.54* 1.82*  CALCIUM 8.6*  --  8.2* 8.2* 8.1* 7.8*  MG 1.8 1.5* 1.8  --   --   --    GFR: Estimated Creatinine Clearance: 62.5 mL/min (A) (by C-G formula based on SCr of 1.82 mg/dL (H)). Liver Function Tests: Recent Labs  Lab 06/10/20 1029 06/11/20 0511 06/13/20 2017  AST 48* 27 36  ALT '30 21 26  '$ ALKPHOS 332* 247* 277*  BILITOT 0.5 0.6 0.8  PROT 6.3* 5.3* 6.1*  ALBUMIN 2.3* 1.9* 2.3*   Recent Labs  Lab 06/10/20 1029 06/13/20 2017  LIPASE 19 18   No results for input(s): AMMONIA in the last 168 hours. Coagulation Profile: Recent Labs  Lab 06/11/20 0511  INR 1.5*   Cardiac Enzymes: No results for input(s): CKTOTAL, CKMB, CKMBINDEX, TROPONINI in the last 168 hours. BNP (last 3 results) No results for input(s): PROBNP in the last 8760 hours. HbA1C: No results for input(s): HGBA1C in the last 72 hours. CBG: Recent Labs  Lab 06/14/20 0814 06/14/20 1233 06/14/20 1619 06/14/20 2050 06/15/20 0816  GLUCAP 138* 145* 143* 93 123*   Lipid Profile: No results for input(s): CHOL, HDL, LDLCALC, TRIG, CHOLHDL, LDLDIRECT in the last 72 hours. Thyroid Function Tests: No results for input(s): TSH, T4TOTAL, FREET4, T3FREE, THYROIDAB in the last 72 hours. Anemia Panel: Recent Labs    06/15/20 0416  TIBC 140*  IRON 17*   Sepsis Labs: No results for input(s): PROCALCITON, LATICACIDVEN in the last 168  hours.  Recent Results (from the past 240 hour(s))  Resp Panel by RT-PCR (Flu A&B, Covid) Nasopharyngeal Swab     Status: None   Collection Time: 06/10/20 12:14 PM   Specimen: Nasopharyngeal Swab; Nasopharyngeal(NP) swabs in vial transport medium  Result Value Ref Range Status   SARS Coronavirus 2 by RT PCR NEGATIVE NEGATIVE Final    Comment: (NOTE) SARS-CoV-2 target nucleic acids are NOT DETECTED.  The SARS-CoV-2 RNA is generally detectable in upper respiratory specimens during the acute phase of infection. The lowest concentration of SARS-CoV-2 viral copies this assay can detect is 138 copies/mL. A negative result does not preclude SARS-Cov-2 infection and should  not be used as the sole basis for treatment or other patient management decisions. A negative result may occur with  improper specimen collection/handling, submission of specimen other than nasopharyngeal swab, presence of viral mutation(s) within the areas targeted by this assay, and inadequate number of viral copies(<138 copies/mL). A negative result must be combined with clinical observations, patient history, and epidemiological information. The expected result is Negative.  Fact Sheet for Patients:  EntrepreneurPulse.com.au  Fact Sheet for Healthcare Providers:  IncredibleEmployment.be  This test is no t yet approved or cleared by the [place] FDA and  has been authorized for detection and/or diagnosis of SARS-CoV-2 by FDA under an Emergency Use Authorization (EUA). This EUA will remain  in effect (meaning this test can be used) for the duration of the COVID-19 declaration under Section 564(b)(1) of the Act, 21 U.S.C.section 360bbb-3(b)(1), unless the authorization is terminated  or revoked sooner.       Influenza A by PCR NEGATIVE NEGATIVE Final   Influenza B by PCR NEGATIVE NEGATIVE Final    Comment: (NOTE) The Xpert Xpress SARS-CoV-2/FLU/RSV plus assay is intended  as an aid in the diagnosis of influenza from Nasopharyngeal swab specimens and should not be used as a sole basis for treatment. Nasal washings and aspirates are unacceptable for Xpert Xpress SARS-CoV-2/FLU/RSV testing.  Fact Sheet for Patients: EntrepreneurPulse.com.au  Fact Sheet for Healthcare Providers: IncredibleEmployment.be  This test is not yet approved or cleared by the [place] FDA and has been authorized for detection and/or diagnosis of SARS-CoV-2 by FDA under an Emergency Use Authorization (EUA). This EUA will remain in effect (meaning this test can be used) for the duration of the COVID-19 declaration under Section 564(b)(1) of the Act, 21 U.S.C. section 360bbb-3(b)(1), unless the authorization is terminated or revoked.  Performed at [facility], [place], [place] [address]   SARS CORONAVIRUS 2 (TAT 6-24 HRS) Nasopharyngeal Nasopharyngeal Swab     Status: None   Collection Time: 06/14/20  5:27 AM   Specimen: Nasopharyngeal Swab  Result Value Ref Range Status   SARS Coronavirus 2 NEGATIVE NEGATIVE Final    Comment: (NOTE) SARS-CoV-2 target nucleic acids are NOT DETECTED.  The SARS-CoV-2 RNA is generally detectable in upper and lower respiratory specimens during the acute phase of infection. Negative results do not preclude SARS-CoV-2 infection, do not rule out co-infections with other pathogens, and should not be used as the sole basis for treatment or other patient management decisions. Negative results must be combined with clinical observations, patient history, and epidemiological information. The expected result is Negative.  Fact Sheet for Patients: SugarRoll.be  Fact Sheet for Healthcare Providers: https://www.woods-mathews.com/  This test is not yet approved or cleared by the [place] FDA and  has been authorized for detection and/or  diagnosis of SARS-CoV-2 by FDA under an Emergency Use Authorization (EUA). This EUA will remain  in effect (meaning this test can be used) for the duration of the COVID-19 declaration under Se ction 564(b)(1) of the Act, 21 U.S.C. section 360bbb-3(b)(1), unless the authorization is terminated or revoked sooner.  Performed at [facility], [place] [address], [place], [place] [address]          Radiology Studies: CT ABDOMEN PELVIS W CONTRAST  Result Date: 06/14/2020 CLINICAL DATA:  Vomiting and mid abdominal pain. EXAM: CT ABDOMEN AND PELVIS WITH CONTRAST TECHNIQUE: Multidetector CT imaging of the abdomen and pelvis was performed using the standard protocol following bolus administration of intravenous contrast. CONTRAST:  168m OMNIPAQUE IOHEXOL 300 MG/ML  SOLN COMPARISON:  May 09, 2020 FINDINGS: Lower chest: Mild areas of atelectasis and/or infiltrate are seen within the bilateral lung bases. Small to moderate size bilateral pleural effusions are noted. These are mildly increased in severity when compared to the prior study. Hepatobiliary: No focal liver abnormality is seen. No gallstones, gallbladder wall thickening, or biliary dilatation. Pancreas: Unremarkable. No pancreatic ductal dilatation or surrounding inflammatory changes. Spleen: Normal in size without focal abnormality. Adrenals/Urinary Tract: Adrenal glands are unremarkable. Kidneys are normal, without renal calculi, focal lesion, or hydronephrosis. Bladder is unremarkable. Stomach/Bowel: Stomach is within normal limits. Appendix appears normal. No evidence of bowel wall thickening, distention, or inflammatory changes. Vascular/Lymphatic: No significant vascular findings are present. No enlarged abdominal or pelvic lymph nodes. Reproductive: Prostate is unremarkable. Other: Diffuse moderate severity anasarca of the abdominal and pelvic walls is seen. This is mildly increased in severity when compared to the prior study. No  abdominopelvic ascites. Musculoskeletal: No acute or significant osseous findings. IMPRESSION: 1. Mild bibasilar atelectasis and/or infiltrate. 2. Small to moderate size bilateral pleural effusions, mildly increased in severity when compared to the prior study. 3. Diffuse moderate severity anasarca, mildly increased in severity when compared to the prior study. Electronically Signed   By: TVirgina NorfolkM.D.   On: 06/14/2020 02:36        Scheduled Meds: . amLODipine  10 mg Oral Daily  . atorvastatin  40 mg Oral QHS  . cloNIDine  0.2 mg Transdermal Weekly  . diclofenac Sodium  2 g Topical QID  . furosemide  40 mg Oral Daily  . insulin aspart  0-15 Units Subcutaneous TID WC  . lisinopril  40 mg Oral Daily  . metoCLOPramide  5 mg Oral TID AC  . rivaroxaban  20 mg Oral Q supper  . sodium chloride flush  3 mL Intravenous Q12H   Continuous Infusions: . sodium chloride    . lactated ringers with kcl       LOS: 0 days    Time spent: 28 minutes    DSharen Hones MD [facility]   To contact the attending provider between 7A-7P or the covering provider during after hours 7P-7A, please log into the web site [email or web address] and access using universal [place]s password for that web site. If you do not have the password, please call the hospital operator.  06/15/2020, 10:21 AM

## 2020-06-16 ENCOUNTER — Inpatient Hospital Stay: Payer: BC Managed Care – PPO

## 2020-06-16 DIAGNOSIS — E1121 Type 2 diabetes mellitus with diabetic nephropathy: Secondary | ICD-10-CM

## 2020-06-16 DIAGNOSIS — D500 Iron deficiency anemia secondary to blood loss (chronic): Secondary | ICD-10-CM

## 2020-06-16 DIAGNOSIS — E876 Hypokalemia: Secondary | ICD-10-CM

## 2020-06-16 DIAGNOSIS — N1831 Chronic kidney disease, stage 3a: Secondary | ICD-10-CM

## 2020-06-16 DIAGNOSIS — N179 Acute kidney failure, unspecified: Secondary | ICD-10-CM

## 2020-06-16 LAB — CBC WITH DIFFERENTIAL/PLATELET
Abs Immature Granulocytes: 0.03 10*3/uL (ref 0.00–0.07)
Basophils Absolute: 0.1 10*3/uL (ref 0.0–0.1)
Basophils Relative: 1 %
Eosinophils Absolute: 0.4 10*3/uL (ref 0.0–0.5)
Eosinophils Relative: 5 %
HCT: 26.2 % — ABNORMAL LOW (ref 39.0–52.0)
Hemoglobin: 8.9 g/dL — ABNORMAL LOW (ref 13.0–17.0)
Immature Granulocytes: 0 %
Lymphocytes Relative: 18 %
Lymphs Abs: 1.5 10*3/uL (ref 0.7–4.0)
MCH: 29.1 pg (ref 26.0–34.0)
MCHC: 34 g/dL (ref 30.0–36.0)
MCV: 85.6 fL (ref 80.0–100.0)
Monocytes Absolute: 0.9 10*3/uL (ref 0.1–1.0)
Monocytes Relative: 11 %
Neutro Abs: 5.3 10*3/uL (ref 1.7–7.7)
Neutrophils Relative %: 65 %
Platelets: 385 10*3/uL (ref 150–400)
RBC: 3.06 MIL/uL — ABNORMAL LOW (ref 4.22–5.81)
RDW: 13.2 % (ref 11.5–15.5)
WBC: 8.1 10*3/uL (ref 4.0–10.5)
nRBC: 0 % (ref 0.0–0.2)

## 2020-06-16 LAB — GLUCOSE, CAPILLARY
Glucose-Capillary: 124 mg/dL — ABNORMAL HIGH (ref 70–99)
Glucose-Capillary: 149 mg/dL — ABNORMAL HIGH (ref 70–99)
Glucose-Capillary: 158 mg/dL — ABNORMAL HIGH (ref 70–99)

## 2020-06-16 LAB — BASIC METABOLIC PANEL
Anion gap: 9 (ref 5–15)
BUN: 22 mg/dL — ABNORMAL HIGH (ref 6–20)
CO2: 28 mmol/L (ref 22–32)
Calcium: 7.9 mg/dL — ABNORMAL LOW (ref 8.9–10.3)
Chloride: 100 mmol/L (ref 98–111)
Creatinine, Ser: 2.13 mg/dL — ABNORMAL HIGH (ref 0.61–1.24)
GFR, Estimated: 39 mL/min — ABNORMAL LOW (ref >60)
Glucose, Bld: 126 mg/dL — ABNORMAL HIGH (ref 70–99)
Potassium: 3.8 mmol/L (ref 3.5–5.1)
Sodium: 137 mmol/L (ref 135–145)

## 2020-06-16 LAB — MAGNESIUM: Magnesium: 1.8 mg/dL (ref 1.7–2.4)

## 2020-06-16 MED ORDER — HYDRALAZINE HCL 25 MG PO TABS
25.0000 mg | ORAL_TABLET | Freq: Three times a day (TID) | ORAL | Status: DC
  Administered 2020-06-16 – 2020-06-17 (×3): 25 mg via ORAL
  Filled 2020-06-16 (×3): qty 1

## 2020-06-16 MED ORDER — METOPROLOL TARTRATE 50 MG PO TABS
50.0000 mg | ORAL_TABLET | Freq: Two times a day (BID) | ORAL | Status: DC
  Administered 2020-06-16 – 2020-06-17 (×2): 50 mg via ORAL
  Filled 2020-06-16 (×2): qty 1

## 2020-06-16 NOTE — Progress Notes (Signed)
PROGRESS NOTE    Bradley Lopez  [id number] DOB: 1975/05/26 DOA: 06/14/2020 PCP: Baxter Hire, MD   Follow-up on hypertension urgency. Brief Narrative:  Bradley Lopez a 45 y.o.malewith medical history significant forhypertension, nephrotic syndrome, diabetes mellitus with complications of stage III chronic kidney disease who was recently discharged from the hospitalon 02/21/22after treatment for hypertensive emergency. Patient developed nausea vomiting, could not taking his blood pressure medicine.  Came back to the hospital with hypertension emergency.  We started on Reglan, nausea vomiting is better.   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  Hypertension urgency. Condition improving as patient nausea vomiting has resolved.  Continue current treatment.  2.  Acute kidney injury on chronic kidney disease stage IIIa. Nephrotic syndrome. Patient had a worsening renal function today.  Bladder scan showed about 250 mL of urine before the void.  Discussed with the nurse, will continue monitor for urinary retention.  We also obtain renal ultrasound. She has received fluids since yesterday, renal function still worse.  Nephrology notified.  #3.  Type 2 diabetes with chronic kidney disease stage IIIa. Gastroparesis. Continue current treatment for diabetes.  We will continue Reglan for total course of 2 weeks.  4.  History of DVT. Continue Xarelto.  5.  Anemia of chronic kidney disease. Iron deficient anemia. Patient has received iron IV.      DVT prophylaxis: Xarelto Code Status: Full Family Communication:  Disposition Plan:  .   Status is: Inpatient  Remains  inpatient appropriate because:Inpatient level of care appropriate due to severity of illness   Dispo: The patient is from: Home              Anticipated d/c is to: Home              Patient currently is not medically stable to d/c.   Difficult to place patient No        I/O last 3 completed shifts: In: 120 [P.O.:120] Out: -  Total I/O In: 0  Out: 150 [Urine:150]     Consultants:   Nephrology  Procedures: None  Antimicrobials: None  Subjective: Patient feels well today, no abdominal pain or nausea vomiting.  He had 2 normal looking bowel movements. Denies any short of breath or cough. No chest pain or palpitation. No fever or chills.  Objective: Vitals:   06/15/20 2026 06/16/20 0520 06/16/20 0617 06/16/20 0744  BP: (!) 167/88 (!) 149/90  (!) 162/95  Pulse: 82 76  82  Resp: '20 20  12  '$ Temp: 98.3 F (36.8 C) 97.7 F (36.5 C)  97.8 F (36.6 C)  TempSrc: Oral Oral  Oral  SpO2: 100% 100%  99%  Weight:   104 kg   Height:        Intake/Output Summary (Last 24 hours) at 06/16/2020 1211 Last data filed at 06/16/2020 1054 Gross per 24 hour  Intake 120 ml  Output 150 ml  Net -30 ml   Filed Weights   06/14/20 1611 06/15/20 0331 06/16/20 0617  Weight: 99.8 kg 100.2 kg 104 kg    Examination:  General exam: Appears calm and comfortable  Respiratory system: Clear to auscultation. Respiratory effort normal. Cardiovascular  system: S1 & S2 heard, RRR. No JVD, murmurs, rubs, gallops or clicks. No pedal edema. Gastrointestinal system: Abdomen is nondistended, soft and nontender. No organomegaly or masses felt. Normal bowel sounds heard. Central nervous system: Alert and oriented. No focal neurological deficits. Extremities: Symmetric 5 x 5 power. Skin: No rashes, lesions or ulcers Psychiatry:  Mood & affect appropriate.     Data Reviewed: I have personally reviewed following labs and imaging studies  CBC: Recent Labs  Lab 06/10/20 1029 06/11/20 0511  06/13/20 0857 06/13/20 2017 06/15/20 0416 06/16/20 0434  WBC 10.7* 9.4 8.5 11.8* 7.2 8.1  NEUTROABS 7.8*  --   --   --   --  5.3  HGB 10.2* 8.9* 9.6* 9.9* 8.5* 8.9*  HCT 30.9* 26.7* 28.7* 30.4* 25.4* 26.2*  MCV 86.1 87.0 85.4 85.9 84.9 85.6  PLT 446* 385 390 415* 373 0000000   Basic Metabolic Panel: Recent Labs  Lab 06/10/20 1029 06/10/20 1815 06/11/20 0511 06/13/20 0857 06/13/20 2017 06/15/20 0416 06/16/20 0434  NA 141  --  139 135 135 137 137  K 3.8  --  3.3* 3.6 3.6 3.3* 3.8  CL 105  --  104 100 101 101 100  CO2 28  --  '27 27 27 30 28  '$ GLUCOSE 182*  --  167* 180* 146* 104* 126*  BUN 19  --  '19 18 19 19 '$ 22*  CREATININE 1.63*  --  1.63* 1.48* 1.54* 1.82* 2.13*  CALCIUM 8.6*  --  8.2* 8.2* 8.1* 7.8* 7.9*  MG 1.8 1.5* 1.8  --   --   --  1.8   GFR: Estimated Creatinine Clearance: 58.4 mL/min (A) (by C-G formula based on SCr of 2.13 mg/dL (H)). Liver Function Tests: Recent Labs  Lab 06/10/20 1029 06/11/20 0511 06/13/20 2017  AST 48* 27 36  ALT '30 21 26  '$ ALKPHOS 332* 247* 277*  BILITOT 0.5 0.6 0.8  PROT 6.3* 5.3* 6.1*  ALBUMIN 2.3* 1.9* 2.3*   Recent Labs  Lab 06/10/20 1029 06/13/20 2017  LIPASE 19 18   No results for input(s): AMMONIA in the last 168 hours. Coagulation Profile: Recent Labs  Lab 06/11/20 0511  INR 1.5*   Cardiac Enzymes: No results for input(s): CKTOTAL, CKMB, CKMBINDEX, TROPONINI in the last 168 hours. BNP (last 3 results) No results for input(s): PROBNP in the last 8760 hours. HbA1C: No results for input(s): HGBA1C in the last 72 hours. CBG: Recent Labs  Lab 06/15/20 0816 06/15/20 1218 06/15/20 1559 06/15/20 2022 06/16/20 0745  GLUCAP 123* 122* 127* 120* 124*   Lipid Profile: No results for input(s): CHOL, HDL, LDLCALC, TRIG, CHOLHDL, LDLDIRECT in the last 72 hours. Thyroid Function Tests: No results for input(s): TSH, T4TOTAL, FREET4, T3FREE, THYROIDAB in the last 72 hours. Anemia Panel: Recent Labs    06/15/20 0416  06/15/20 0924  VITAMINB12  --  343  TIBC 140*  --   IRON 17*  --    Sepsis Labs: No results for input(s): PROCALCITON, LATICACIDVEN in the last 168 hours.  Recent Results (from the past 240 hour(s))  Resp Panel by RT-PCR (Flu A&B, Covid) Nasopharyngeal Swab     Status: None   Collection Time: 06/10/20 12:14 PM   Specimen: Nasopharyngeal Swab; Nasopharyngeal(NP) swabs in vial transport medium  Result Value Ref Range Status   SARS Coronavirus 2 by RT PCR NEGATIVE NEGATIVE Final    Comment: (NOTE) SARS-CoV-2 target nucleic acids are NOT DETECTED.  The SARS-CoV-2 RNA is generally detectable in  upper respiratory specimens during the acute phase of infection. The lowest concentration of SARS-CoV-2 viral copies this assay can detect is 138 copies/mL. A negative result does not preclude SARS-Cov-2 infection and should not be used as the sole basis for treatment or other patient management decisions. A negative result may occur with  improper specimen collection/handling, submission of specimen other than nasopharyngeal swab, presence of viral mutation(s) within the areas targeted by this assay, and inadequate number of viral copies(<138 copies/mL). A negative result must be combined with clinical observations, patient history, and epidemiological information. The expected result is Negative.  Fact Sheet for Patients:  EntrepreneurPulse.com.au  Fact Sheet for Healthcare Providers:  IncredibleEmployment.be  This test is no t yet approved or cleared by the [place] FDA and  has been authorized for detection and/or diagnosis of SARS-CoV-2 by FDA under an Emergency Use Authorization (EUA). This EUA will remain  in effect (meaning this test can be used) for the duration of the COVID-19 declaration under Section 564(b)(1) of the Act, 21 U.S.C.section 360bbb-3(b)(1), unless the authorization is terminated  or revoked sooner.       Influenza A by  PCR NEGATIVE NEGATIVE Final   Influenza B by PCR NEGATIVE NEGATIVE Final    Comment: (NOTE) The Xpert Xpress SARS-CoV-2/FLU/RSV plus assay is intended as an aid in the diagnosis of influenza from Nasopharyngeal swab specimens and should not be used as a sole basis for treatment. Nasal washings and aspirates are unacceptable for Xpert Xpress SARS-CoV-2/FLU/RSV testing.  Fact Sheet for Patients: EntrepreneurPulse.com.au  Fact Sheet for Healthcare Providers: IncredibleEmployment.be  This test is not yet approved or cleared by the [place] FDA and has been authorized for detection and/or diagnosis of SARS-CoV-2 by FDA under an Emergency Use Authorization (EUA). This EUA will remain in effect (meaning this test can be used) for the duration of the COVID-19 declaration under Section 564(b)(1) of the Act, 21 U.S.C. section 360bbb-3(b)(1), unless the authorization is terminated or revoked.  Performed at [facility], [place], [place] [address]   SARS CORONAVIRUS 2 (TAT 6-24 HRS) Nasopharyngeal Nasopharyngeal Swab     Status: None   Collection Time: 06/14/20  5:27 AM   Specimen: Nasopharyngeal Swab  Result Value Ref Range Status   SARS Coronavirus 2 NEGATIVE NEGATIVE Final    Comment: (NOTE) SARS-CoV-2 target nucleic acids are NOT DETECTED.  The SARS-CoV-2 RNA is generally detectable in upper and lower respiratory specimens during the acute phase of infection. Negative results do not preclude SARS-CoV-2 infection, do not rule out co-infections with other pathogens, and should not be used as the sole basis for treatment or other patient management decisions. Negative results must be combined with clinical observations, patient history, and epidemiological information. The expected result is Negative.  Fact Sheet for Patients: SugarRoll.be  Fact Sheet for Healthcare  Providers: https://www.woods-mathews.com/  This test is not yet approved or cleared by the [place] FDA and  has been authorized for detection and/or diagnosis of SARS-CoV-2 by FDA under an Emergency Use Authorization (EUA). This EUA will remain  in effect (meaning this test can be used) for the duration of the COVID-19 declaration under Se ction 564(b)(1) of the Act, 21 U.S.C. section 360bbb-3(b)(1), unless the authorization is terminated or revoked sooner.  Performed at [facility], [place] [address] [place], [place] [address]          Radiology Studies: No results found.      Scheduled Meds: . amLODipine  10 mg Oral Daily  . atorvastatin  40 mg Oral QHS  . cloNIDine  0.2 mg Transdermal Weekly  . diclofenac Sodium  2 g Topical QID  . insulin aspart  0-15 Units Subcutaneous TID WC  . lisinopril  40 mg Oral Daily  . metoCLOPramide  5 mg Oral TID AC  . rivaroxaban  20 mg Oral Q supper  . sodium chloride flush  3 mL Intravenous Q12H   Continuous Infusions: . sodium chloride    . lactated ringers with kcl 75 mL/hr at 06/16/20 0124     LOS: 1 day    Time spent: 28 minutes    Sharen Hones, MD [facility]   To contact the attending provider between 7A-7P or the covering provider during after hours 7P-7A, please log into the web site [email or web address] and access using universal [place] password for that web site. If you do not have the password, please call the hospital operator.  06/16/2020, 12:11 PM

## 2020-06-16 NOTE — Progress Notes (Signed)
[place], [place] 06/16/20  Subjective:  Bradley Lopez is a 45 y.o. is a african [place] male with PMHX of type 2 diabetes, poorly controlled hypertension. 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 He presented to ED with nausea and vomiting.   2/22-Re-admitted today due to nausea and vmiting at home last night. Drank a 16 oz water bootle and ate 3/4 of grille dchicken and some broccoli. Vomited 6-7 times. Was not able to take meds. Returned to ER for evaluation BP 207/16 at arrival. 178/102 now. Taking in clears. No n/v this morning.   Patient seen resting in bed Alert and oriented Able to tolerate solid meals Denies nausea and vomiting Denies shortness of breath and chest pain Concerns with upper arm swelling Says his voiding has decreased   Objective:  Vital signs in last 24 hours:  Temp:  [97.4 F (36.3 C)-98.3 F (36.8 C)] 97.8 F (36.6 C) (02/24 0744) Pulse Rate:  [76-82] 82 (02/24 0744) Resp:  [12-20] 12 (02/24 0744) BP: (138-167)/(81-95) 162/95 (02/24 0744) SpO2:  [99 %-100 %] 99 % (02/24 0744) Weight:  [104 kg] 104 kg (02/24 0617)  Weight change: 4.209 kg Filed Weights   06/14/20 1611 06/15/20 0331 06/16/20 0617  Weight: 99.8 kg 100.2 kg 104 kg    Intake/Output:    Intake/Output Summary (Last 24 hours) at 06/16/2020 1436 Last data filed at 06/16/2020 1345 Gross per 24 hour  Intake 240 ml  Output 150 ml  Net 90 ml    Physical Exam: General:  No acute distress, laying in the bed  HEENT  anicteric, moist oral mucous membrane  Pulm/lungs  normal breathing effort, lungs are clear to auscultation  CVS/Heart  regular rhythm and rate  Abdomen:   Soft, nontender  Extremities:  ++ peripheral edema upto mid abdomen, + edema BUE  Neurologic:  Alert, oriented, able to follow commands  Skin:  No acute rashes     Basic Metabolic Panel:  Recent Labs  Lab 06/10/20 1029  06/10/20 1815 06/11/20 0511 06/13/20 0857 06/13/20 2017 06/15/20 0416 06/16/20 0434  NA 141  --  139 135 135 137 137  K 3.8  --  3.3* 3.6 3.6 3.3* 3.8  CL 105  --  104 100 101 101 100  CO2 28  --  '27 27 27 30 28  '$ GLUCOSE 182*  --  167* 180* 146* 104* 126*  BUN 19  --  '19 18 19 19 '$ 22*  CREATININE 1.63*  --  1.63* 1.48* 1.54* 1.82* 2.13*  CALCIUM 8.6*  --  8.2* 8.2* 8.1* 7.8* 7.9*  MG 1.8 1.5* 1.8  --   --   --  1.8     CBC: Recent Labs  Lab 06/10/20 1029 06/11/20 0511 06/13/20 0857 06/13/20 2017 06/15/20 0416 06/16/20 0434  WBC 10.7* 9.4 8.5 11.8* 7.2 8.1  NEUTROABS 7.8*  --   --   --   --  5.3  HGB 10.2* 8.9* 9.6* 9.9* 8.5* 8.9*  HCT 30.9* 26.7* 28.7* 30.4* 25.4* 26.2*  MCV 86.1 87.0 85.4 85.9 84.9 85.6  PLT 446* 385 390 415* 373 385      Lab Results  Component Value Date   HEPBSAG NON REACTIVE 06/11/2020   HEPBSAB Reactive (A) 06/11/2020      Microbiology:  Recent Results (from the past 240 hour(s))  Resp Panel by RT-PCR (Flu A&B, Covid) Nasopharyngeal Swab     Status: None   Collection  Time: 06/10/20 12:14 PM   Specimen: Nasopharyngeal Swab; Nasopharyngeal(NP) swabs in vial transport medium  Result Value Ref Range Status   SARS Coronavirus 2 by RT PCR NEGATIVE NEGATIVE Final    Comment: (NOTE) SARS-CoV-2 target nucleic acids are NOT DETECTED.  The SARS-CoV-2 RNA is generally detectable in upper respiratory specimens during the acute phase of infection. The lowest concentration of SARS-CoV-2 viral copies this assay can detect is 138 copies/mL. A negative result does not preclude SARS-Cov-2 infection and should not be used as the sole basis for treatment or other patient management decisions. A negative result may occur with  improper specimen collection/handling, submission of specimen other than nasopharyngeal swab, presence of viral mutation(s) within the areas targeted by this assay, and inadequate number of viral copies(<138 copies/mL). A negative  result must be combined with clinical observations, patient history, and epidemiological information. The expected result is Negative.  Fact Sheet for Patients:  EntrepreneurPulse.com.au  Fact Sheet for Healthcare Providers:  IncredibleEmployment.be  This test is no t yet approved or cleared by the [place] FDA and  has been authorized for detection and/or diagnosis of SARS-CoV-2 by FDA under an Emergency Use Authorization (EUA). This EUA will remain  in effect (meaning this test can be used) for the duration of the COVID-19 declaration under Section 564(b)(1) of the Act, 21 U.S.C.section 360bbb-3(b)(1), unless the authorization is terminated  or revoked sooner.       Influenza A by PCR NEGATIVE NEGATIVE Final   Influenza B by PCR NEGATIVE NEGATIVE Final    Comment: (NOTE) The Xpert Xpress SARS-CoV-2/FLU/RSV plus assay is intended as an aid in the diagnosis of influenza from Nasopharyngeal swab specimens and should not be used as a sole basis for treatment. Nasal washings and aspirates are unacceptable for Xpert Xpress SARS-CoV-2/FLU/RSV testing.  Fact Sheet for Patients: EntrepreneurPulse.com.au  Fact Sheet for Healthcare Providers: IncredibleEmployment.be  This test is not yet approved or cleared by the [place] FDA and has been authorized for detection and/or diagnosis of SARS-CoV-2 by FDA under an Emergency Use Authorization (EUA). This EUA will remain in effect (meaning this test can be used) for the duration of the COVID-19 declaration under Section 564(b)(1) of the Act, 21 U.S.C. section 360bbb-3(b)(1), unless the authorization is terminated or revoked.  Performed at [facility], [place], [place] [address]   SARS CORONAVIRUS 2 (TAT 6-24 HRS) Nasopharyngeal Nasopharyngeal Swab     Status: None   Collection Time: 06/14/20  5:27 AM   Specimen: Nasopharyngeal  Swab  Result Value Ref Range Status   SARS Coronavirus 2 NEGATIVE NEGATIVE Final    Comment: (NOTE) SARS-CoV-2 target nucleic acids are NOT DETECTED.  The SARS-CoV-2 RNA is generally detectable in upper and lower respiratory specimens during the acute phase of infection. Negative results do not preclude SARS-CoV-2 infection, do not rule out co-infections with other pathogens, and should not be used as the sole basis for treatment or other patient management decisions. Negative results must be combined with clinical observations, patient history, and epidemiological information. The expected result is Negative.  Fact Sheet for Patients: SugarRoll.be  Fact Sheet for Healthcare Providers: https://www.woods-mathews.com/  This test is not yet approved or cleared by the [place] FDA and  has been authorized for detection and/or diagnosis of SARS-CoV-2 by FDA under an Emergency Use Authorization (EUA). This EUA will remain  in effect (meaning this test can be used) for the duration of the COVID-19 declaration under Se ction 564(b)(1) of  the Act, 21 U.S.C. section 360bbb-3(b)(1), unless the authorization is terminated or revoked sooner.  Performed at [facility], [place] [address] [place], [place] [address]     Coagulation Studies: No results for input(s): LABPROT, INR in the last 72 hours.  Urinalysis: No results for input(s): COLORURINE, LABSPEC, PHURINE, GLUCOSEU, HGBUR, BILIRUBINUR, KETONESUR, PROTEINUR, UROBILINOGEN, NITRITE, LEUKOCYTESUR in the last 72 hours.  Invalid input(s): APPERANCEUR    Imaging: No results found.   Medications:   . sodium chloride    . lactated ringers with kcl 75 mL/hr at 06/16/20 0124   . amLODipine  10 mg Oral Daily  . atorvastatin  40 mg Oral QHS  . cloNIDine  0.2 mg Transdermal Weekly  . diclofenac Sodium  2 g Topical QID  . insulin aspart  0-15 Units Subcutaneous TID WC  . lisinopril   40 mg Oral Daily  . metoCLOPramide  5 mg Oral TID AC  . rivaroxaban  20 mg Oral Q supper  . sodium chloride flush  3 mL Intravenous Q12H   sodium chloride, acetaminophen, hydrALAZINE, ondansetron **OR** ondansetron (ZOFRAN) IV, sodium chloride flush  Assessment/ Plan:  45 y.o. 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)   Anemia due to chronic kidney disease   Hypokalemia   Iron deficiency anemia due to chronic blood loss   Acute renal failure superimposed on stage 3a chronic kidney disease ([place])  Anasarca [R60.1] Hypertensive urgency [I16.0] Hypertensive emergency [I16.1] Intractable nausea and vomiting [R11.2] Chronic kidney disease, unspecified CKD stage [N18.9]  #.Nephrotic syndrome with proteinuria UPC 8 gm # CKD stage 3a GFR 57 -likely due to poorly controlled diabetes -BP stable at 160s/90s -Clonidine patch 0.'2mg'$ , Amlodipine and lisinopril -Creatinine slightly worse 2.1 -Renal US ordered by primary team-awaiting results Discussed condition and interventions needed to manage this condition and slow the progression. No need for dialysis at this time.  #. Anemia of Chronic kidney disease  Lab Results  Component Value Date   HGB 8.9 (L) 06/16/2020  Decreased since admission Will monitor this level.    #. Diabetes type 2 with CKD Hgb A1c MFr Bld (%)  Date Value  06/11/2020 10.7 (H)  Discussed need for stricter blood sugar controls  # LE edema and uncontrolled HTN See above Lasix    LOS: [place] 2/24/20222:36 PM  [facility] [place], [place]

## 2020-06-16 NOTE — Progress Notes (Signed)
Bladder scanned patient with MD Roosevelt Locks at bedside. Urine in bladder noted to be 265m. Patient is going for ultrasound of kidneys. Patient to notify NT TKoreaor this RN when uses urinal so we can keep MD up to date on urine output.

## 2020-06-16 NOTE — Progress Notes (Signed)
Bladder scan repeated. <8m noted on scan.

## 2020-06-17 DIAGNOSIS — N170 Acute kidney failure with tubular necrosis: Secondary | ICD-10-CM

## 2020-06-17 LAB — BASIC METABOLIC PANEL
Anion gap: 5 (ref 5–15)
BUN: 20 mg/dL (ref 6–20)
CO2: 30 mmol/L (ref 22–32)
Calcium: 8 mg/dL — ABNORMAL LOW (ref 8.9–10.3)
Chloride: 103 mmol/L (ref 98–111)
Creatinine, Ser: 2.41 mg/dL — ABNORMAL HIGH (ref 0.61–1.24)
GFR, Estimated: 33 mL/min — ABNORMAL LOW (ref >60)
Glucose, Bld: 130 mg/dL — ABNORMAL HIGH (ref 70–99)
Potassium: 4.1 mmol/L (ref 3.5–5.1)
Sodium: 138 mmol/L (ref 135–145)

## 2020-06-17 LAB — MAGNESIUM: Magnesium: 1.7 mg/dL (ref 1.7–2.4)

## 2020-06-17 LAB — GLUCOSE, CAPILLARY: Glucose-Capillary: 156 mg/dL — ABNORMAL HIGH (ref 70–99)

## 2020-06-17 MED ORDER — FUROSEMIDE 40 MG PO TABS: 40.0000 mg | ORAL_TABLET | Freq: Two times a day (BID) | ORAL | Status: DC

## 2020-06-17 MED ORDER — HYDRALAZINE HCL 25 MG PO TABS: 25.0000 mg | ORAL_TABLET | Freq: Three times a day (TID) | ORAL | 0 refills | Status: DC

## 2020-06-17 MED ORDER — METOPROLOL TARTRATE 50 MG PO TABS: 50.0000 mg | ORAL_TABLET | Freq: Two times a day (BID) | ORAL | 0 refills | Status: DC

## 2020-06-17 MED ORDER — CLONIDINE 0.2 MG/24HR TD PTWK: 0.2000 mg | MEDICATED_PATCH | TRANSDERMAL | 0 refills | Status: DC

## 2020-06-17 MED ORDER — ONDANSETRON HCL 4 MG PO TABS: 4.0000 mg | ORAL_TABLET | Freq: Four times a day (QID) | ORAL | 0 refills | Status: DC | PRN

## 2020-06-17 MED ORDER — METOCLOPRAMIDE HCL 5 MG PO TABS: 5.0000 mg | ORAL_TABLET | Freq: Three times a day (TID) | ORAL | 0 refills | Status: DC | PRN

## 2020-06-17 MED ORDER — APIXABAN 5 MG PO TABS: 5.0000 mg | ORAL_TABLET | Freq: Two times a day (BID) | ORAL | 0 refills | Status: DC

## 2020-06-17 MED ORDER — CLONIDINE HCL 0.1 MG PO TABS
0.1000 mg | ORAL_TABLET | Freq: Two times a day (BID) | ORAL | 0 refills | Status: DC
Start: 2020-06-17 — End: 2020-06-17

## 2020-06-17 MED ORDER — FUROSEMIDE 40 MG PO TABS: 40.0000 mg | ORAL_TABLET | Freq: Two times a day (BID) | ORAL | 0 refills | Status: DC

## 2020-06-17 NOTE — Discharge Summary (Signed)
Physician Discharge Summary  Patient ID: Bradley Lopez MRN: [id number] DOB/AGE: [birth year] 45 y.o.  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 Patient developed nausea vomiting, could not taking his blood pressure medicine. Came back to the hospital with hypertension emergency. Westarted on Reglan, nausea vomiting is better.  #1.  Hypertension urgency. Condition improving as patient nausea vomiting has resolved.  Lisinopril discontinued due to acute renal failure.  Increased dose of amlodipine, added a beta-blocker.  Blood pressure now is more stable.  2.  Acute kidney injury on chronic kidney disease stage IIIa, ATN. Nephrotic syndrome. Patient nausea vomiting has resolved.  However, creatinine gradually going up.  Discussed with Dr. Candiss Norse from nephrology, patient condition is probably due to hypertension urgency causing ATN.  Creatinine gradually goes up, has not reached a plateau.  Anticipating gradually going up to reach a plateau and then come down.  Dr. Candiss Norse will follow up and see him on Monday, there is no additional treatment in the hospital. Patient did  receive fluids for worsening renal function, did not have any effect.  We decided to put the patient back on Lasix 40 mg twice a day.   #3.  Type 2 diabetes with chronic kidney disease stage IIIa. Diabetic gastroparesis. Patient had a significant nausea vomiting, consistent with diabetic gastroparesis. He was treated with Reglan, significantly improved his symptoms. We will continue 1 week as needed treatment.  4.  History of DVT. Due to worsening renal function, anticoagulation will change to Eliquis from Xarelto.  5.  Anemia of chronic kidney disease. Iron deficient anemia. Patient has received iron IV.   Consults: nephrology  Significant Diagnostic Studies:  RENAL / URINARY TRACT ULTRASOUND COMPLETE  COMPARISON:  CT abdomen and pelvis 06/14/2020.  FINDINGS: Right Kidney:  Renal measurements: 11.9 x 7.4 x 6.7 cm = volume: There is 309.1 mL. Echogenicity appears increased. No mass or hydronephrosis visualized.  Left Kidney:  Renal measurements: 12.5 x 7.2 x 7.0 cm = volume: 327.7 mL. Echogenicity appears increased. No mass or hydronephrosis visualized.  Bladder:  Appears normal for degree of bladder distention.  Other:  None.  IMPRESSION: Negative for hydronephrosis.  Some increase in cortical echogenicity bilaterally compatible with medical renal disease.   Electronically Signed   By: Inge Rise M.D.   On: 06/16/2020 16:20  CT ABDOMEN AND PELVIS WITH CONTRAST  TECHNIQUE: Multidetector CT imaging of the abdomen and pelvis was performed using the standard protocol following bolus administration of intravenous contrast.  CONTRAST:  175m OMNIPAQUE IOHEXOL 300 MG/ML  SOLN  COMPARISON:  May 09, 2020  FINDINGS: Lower chest: Mild areas of atelectasis and/or infiltrate are seen within the bilateral lung bases.  Small to moderate size bilateral pleural effusions are noted. These are mildly increased in  severity when  compared to the prior study.  Hepatobiliary: No focal liver abnormality is seen. No gallstones, gallbladder wall thickening, or biliary dilatation.  Pancreas: Unremarkable. No pancreatic ductal dilatation or surrounding inflammatory changes.  Spleen: Normal in size without focal abnormality.  Adrenals/Urinary Tract: Adrenal glands are unremarkable. Kidneys are normal, without renal calculi, focal lesion, or hydronephrosis. Bladder is unremarkable.  Stomach/Bowel: Stomach is within normal limits. Appendix appears normal. No evidence of bowel wall thickening, distention, or inflammatory changes.  Vascular/Lymphatic: No significant vascular findings are present. No enlarged abdominal or pelvic lymph nodes.  Reproductive: Prostate is unremarkable.  Other: Diffuse moderate severity anasarca of the abdominal and pelvic walls is seen. This is mildly increased in severity when compared to the prior study. No abdominopelvic ascites.  Musculoskeletal: No acute or significant osseous findings.  IMPRESSION: 1. Mild bibasilar atelectasis and/or infiltrate. 2. Small to moderate size bilateral pleural effusions, mildly increased in severity when compared to the prior study. 3. Diffuse moderate severity anasarca, mildly increased in severity when compared to the prior study.   Electronically Signed   By: Virgina Norfolk M.D.   On: 06/14/2020 02:36    Treatments: Bp Control, reglan  Discharge Exam: Blood pressure 135/64, pulse 76, temperature 97.9 F (36.6 C), temperature source Oral, resp. rate 19, height '6\' 3"'$  (1.905 m), weight 100.5 kg, SpO2 100 %. General appearance: alert and cooperative Resp: clear to auscultation bilaterally Cardio: regular rate and rhythm, S1, S2 normal, no murmur, click, rub or gallop GI: soft, non-tender; bowel sounds normal; no masses,  no organomegaly Extremities: edema 2+  Disposition: Discharge disposition: 01-Home or Self  Care       Discharge Instructions    Diet - low sodium heart healthy   Complete by: As directed    Increase activity slowly   Complete by: As directed      Allergies as of 06/17/2020      Reactions   Pine Hives, Itching      Medication List    STOP taking these medications   cloNIDine 0.1 MG tablet Commonly known as: CATAPRES   lisinopril 20 MG tablet Commonly known as: ZESTRIL   rivaroxaban 20 MG Tabs tablet Commonly known as: XARELTO     TAKE these medications   amLODipine 5 MG tablet Commonly known as: NORVASC Take 1 tablet (5 mg total) by mouth daily.   apixaban 5 MG Tabs tablet Commonly known as: ELIQUIS Take 1 tablet (5 mg total) by mouth 2 (two) times daily.   atorvastatin 40 MG tablet Commonly known as: LIPITOR Take 40 mg by mouth at bedtime.   cloNIDine 0.2 mg/24hr patch Commonly known as: CATAPRES - Dosed in mg/24 hr Place 1 patch (0.2 mg total) onto the skin once a week. Start taking on: June 21, 2020   furosemide 40 MG tablet Commonly known as: LASIX Take 1 tablet (40 mg total) by mouth 2 (two) times daily. What changed: when to take this   glipiZIDE 2.5 MG 24 hr tablet Commonly known as: GLUCOTROL XL Take 2.5 mg by mouth daily.   hydrALAZINE 25 MG tablet Commonly known as: APRESOLINE Take 1 tablet (25 mg total) by mouth every 8 (eight) hours.   metoCLOPramide 5 MG tablet Commonly known as: REGLAN Take 1 tablet (5 mg total) by mouth every 8 (eight) hours as needed for up to 7 days for nausea.   metoprolol tartrate 50 MG tablet Commonly known as: LOPRESSOR Take 1 tablet (50 mg total) by mouth 2 (  two) times daily. What changed:   medication strength  how much to take   ondansetron 4 MG tablet Commonly known as: ZOFRAN Take 1 tablet (4 mg total) by mouth every 6 (six) hours as needed for nausea.       Follow-up Information    Baxter Hire, MD Follow up in 1 week(s).   Specialty: Internal Medicine Contact  information: [place] [address] [phone]        Murlean Iba, MD Follow up in 3 day(s).   Specialty: Nephrology Contact information: [place]  [address] [phone]              34 minutes Signed: Sharen Hones 06/17/2020, 10:10 AM

## 2020-06-17 NOTE — Progress Notes (Signed)
[place], [place] 06/17/20  Subjective:  Bradley Lopez is a 45 y.o. is a african [place] male with PMHX of type 2 diabetes, poorly controlled hypertension. 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 He presented to ED with nausea and vomiting.   2/22-Re-admitted today due to nausea and vmiting at home last night. Drank a 16 oz water bootle and ate 3/4 of grille dchicken and some broccoli. Vomited 6-7 times. Was not able to take meds. Returned to ER for evaluation BP 207/16 at arrival. 178/102 now. Taking in clears. No n/v this morning.   Patient seen sitting on bedside Alert and oriented Able to tolerate meals Denies nausea and vomiting Denies shortness of breath and chest pain    Objective:  Vital signs in last 24 hours:  Temp:  [97.4 F (36.3 C)-98.6 F (37 C)] 97.9 F (36.6 C) (02/25 0723) Pulse Rate:  [72-84] 76 (02/25 0723) Resp:  [16-19] 19 (02/25 0723) BP: (135-152)/(64-98) 143/75 (02/25 1015) SpO2:  [99 %-100 %] 100 % (02/25 0723) Weight:  [100.5 kg] 100.5 kg (02/25 0437)  Weight change: -3.528 kg Filed Weights   06/15/20 0331 06/16/20 0617 06/17/20 0437  Weight: 100.2 kg 104 kg 100.5 kg    Intake/Output:    Intake/Output Summary (Last 24 hours) at 06/17/2020 1452 Last data filed at 06/17/2020 1001 Gross per 24 hour  Intake 2427.97 ml  Output 150 ml  Net 2277.97 ml    Physical Exam: General:  No acute distress, laying in the bed  HEENT  anicteric, moist oral mucous membrane  Pulm/lungs  normal breathing effort, lungs are clear to auscultation  CVS/Heart  regular rhythm and rate  Abdomen:   Soft, nontender  Extremities:  ++ peripheral edema upto mid abdomen, + edema BUE  Neurologic:  Alert, oriented, able to follow commands  Skin:  No acute rashes     Basic Metabolic Panel:  Recent Labs  Lab 06/10/20 1815 06/11/20 0511 06/11/20 0511 06/13/20 0857  06/13/20 2017 06/15/20 0416 06/16/20 0434 06/17/20 0624  NA  --  139   < > 135 135 137 137 138  K  --  3.3*   < > 3.6 3.6 3.3* 3.8 4.1  CL  --  104   < > 100 101 101 100 103  CO2  --  27   < > '27 27 30 28 30  '$ GLUCOSE  --  167*   < > 180* 146* 104* 126* 130*  BUN  --  19   < > '18 19 19 '$ 22* 20  CREATININE  --  1.63*   < > 1.48* 1.54* 1.82* 2.13* 2.41*  CALCIUM  --  8.2*   < > 8.2* 8.1* 7.8* 7.9* 8.0*  MG 1.5* 1.8  --   --   --   --  1.8 1.7   < > = values in this interval not displayed.     CBC: Recent Labs  Lab 06/11/20 0511 06/13/20 0857 06/13/20 2017 06/15/20 0416 06/16/20 0434  WBC 9.4 8.5 11.8* 7.2 8.1  NEUTROABS  --   --   --   --  5.3  HGB 8.9* 9.6* 9.9* 8.5* 8.9*  HCT 26.7* 28.7* 30.4* 25.4* 26.2*  MCV 87.0 85.4 85.9 84.9 85.6  PLT 385 390 415* 373 385      Lab Results  Component Value Date   HEPBSAG NON REACTIVE 06/11/2020   HEPBSAB Reactive (A) 06/11/2020  Microbiology:  Recent Results (from the past 240 hour(s))  Resp Panel by RT-PCR (Flu A&B, Covid) Nasopharyngeal Swab     Status: None   Collection Time: 06/10/20 12:14 PM   Specimen: Nasopharyngeal Swab; Nasopharyngeal(NP) swabs in vial transport medium  Result Value Ref Range Status   SARS Coronavirus 2 by RT PCR NEGATIVE NEGATIVE Final    Comment: (NOTE) SARS-CoV-2 target nucleic acids are NOT DETECTED.  The SARS-CoV-2 RNA is generally detectable in upper respiratory specimens during the acute phase of infection. The lowest concentration of SARS-CoV-2 viral copies this assay can detect is 138 copies/mL. A negative result does not preclude SARS-Cov-2 infection and should not be used as the sole basis for treatment or other patient management decisions. A negative result may occur with  improper specimen collection/handling, submission of specimen other than nasopharyngeal swab, presence of viral mutation(s) within the areas targeted by this assay, and inadequate number of viral copies(<138  copies/mL). A negative result must be combined with clinical observations, patient history, and epidemiological information. The expected result is Negative.  Fact Sheet for Patients:  EntrepreneurPulse.com.au  Fact Sheet for Healthcare Providers:  IncredibleEmployment.be  This test is no t yet approved or cleared by the [place] FDA and  has been authorized for detection and/or diagnosis of SARS-CoV-2 by FDA under an Emergency Use Authorization (EUA). This EUA will remain  in effect (meaning this test can be used) for the duration of the COVID-19 declaration under Section 564(b)(1) of the Act, 21 U.S.C.section 360bbb-3(b)(1), unless the authorization is terminated  or revoked sooner.       Influenza A by PCR NEGATIVE NEGATIVE Final   Influenza B by PCR NEGATIVE NEGATIVE Final    Comment: (NOTE) The Xpert Xpress SARS-CoV-2/FLU/RSV plus assay is intended as an aid in the diagnosis of influenza from Nasopharyngeal swab specimens and should not be used as a sole basis for treatment. Nasal washings and aspirates are unacceptable for Xpert Xpress SARS-CoV-2/FLU/RSV testing.  Fact Sheet for Patients: EntrepreneurPulse.com.au  Fact Sheet for Healthcare Providers: IncredibleEmployment.be  This test is not yet approved or cleared by the [place] FDA and has been authorized for detection and/or diagnosis of SARS-CoV-2 by FDA under an Emergency Use Authorization (EUA). This EUA will remain in effect (meaning this test can be used) for the duration of the COVID-19 declaration under Section 564(b)(1) of the Act, 21 U.S.C. section 360bbb-3(b)(1), unless the authorization is terminated or revoked.  Performed at [facility], [place], [place] [address]   SARS CORONAVIRUS 2 (TAT 6-24 HRS) Nasopharyngeal Nasopharyngeal Swab     Status: None   Collection Time: 06/14/20  5:27 AM    Specimen: Nasopharyngeal Swab  Result Value Ref Range Status   SARS Coronavirus 2 NEGATIVE NEGATIVE Final    Comment: (NOTE) SARS-CoV-2 target nucleic acids are NOT DETECTED.  The SARS-CoV-2 RNA is generally detectable in upper and lower respiratory specimens during the acute phase of infection. Negative results do not preclude SARS-CoV-2 infection, do not rule out co-infections with other pathogens, and should not be used as the sole basis for treatment or other patient management decisions. Negative results must be combined with clinical observations, patient history, and epidemiological information. The expected result is Negative.  Fact Sheet for Patients: SugarRoll.be  Fact Sheet for Healthcare Providers: https://www.woods-mathews.com/  This test is not yet approved or cleared by the [place] FDA and  has been authorized for detection and/or diagnosis of SARS-CoV-2 by FDA under an Emergency  Use Authorization (EUA). This EUA will remain  in effect (meaning this test can be used) for the duration of the COVID-19 declaration under Se ction 564(b)(1) of the Act, 21 U.S.C. section 360bbb-3(b)(1), unless the authorization is terminated or revoked sooner.  Performed at [facility], [place] [address] [place], [place] [address]     Coagulation Studies: No results for input(s): LABPROT, INR in the last 72 hours.  Urinalysis: No results for input(s): COLORURINE, LABSPEC, PHURINE, GLUCOSEU, HGBUR, BILIRUBINUR, KETONESUR, PROTEINUR, UROBILINOGEN, NITRITE, LEUKOCYTESUR in the last 72 hours.  Invalid input(s): APPERANCEUR    Imaging: US RENAL  Result Date: 06/16/2020 CLINICAL DATA:  Acute kidney injury. EXAM: RENAL / URINARY TRACT ULTRASOUND COMPLETE COMPARISON:  CT abdomen and pelvis 06/14/2020. FINDINGS: Right Kidney: Renal measurements: 11.9 x 7.4 x 6.7 cm = volume: There is 309.1 mL. Echogenicity appears increased. No mass or  hydronephrosis visualized. Left Kidney: Renal measurements: 12.5 x 7.2 x 7.0 cm = volume: 327.7 mL. Echogenicity appears increased. No mass or hydronephrosis visualized. Bladder: Appears normal for degree of bladder distention. Other: None. IMPRESSION: Negative for hydronephrosis. Some increase in cortical echogenicity bilaterally compatible with medical renal disease. Electronically Signed   By: Inge Rise M.D.   On: 06/16/2020 16:20     Medications:   . sodium chloride     . amLODipine  10 mg Oral Daily  . atorvastatin  40 mg Oral QHS  . cloNIDine  0.2 mg Transdermal Weekly  . diclofenac Sodium  2 g Topical QID  . furosemide  40 mg Oral BID  . hydrALAZINE  25 mg Oral Q8H  . insulin aspart  0-15 Units Subcutaneous TID WC  . metoCLOPramide  5 mg Oral TID AC  . metoprolol tartrate  50 mg Oral BID  . rivaroxaban  20 mg Oral Q supper  . sodium chloride flush  3 mL Intravenous Q12H   sodium chloride, acetaminophen, hydrALAZINE, ondansetron **OR** ondansetron (ZOFRAN) IV, sodium chloride flush  Assessment/ Plan:  45 y.o. 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)   Anemia due to chronic kidney disease   Hypokalemia   Iron deficiency anemia due to chronic blood loss   Acute renal failure superimposed on stage 3a chronic kidney disease ([place])  Anasarca [R60.1] Hypertensive urgency [I16.0] Hypertensive emergency [I16.1] Intractable nausea and vomiting [R11.2] Chronic kidney disease, unspecified CKD stage [N18.9]  #.Nephrotic syndrome with proteinuria UPC 8 gm # CKD stage 3a GFR 57 -Baseline creatinine 1.48/GFR 60 in Feb 21,2022 -likely due to poorly controlled diabetes and hypertension -BP  stable at 160s/90s -Clonidine patch 0.'2mg'$ , Amlodipine and lisinopril -Creatinine continues to worsen 2.41 -Renal US ordered by primary team-negative for new findings. Medical renal disease Follow-up with nephrology at discharge Office follow up with labs next week  #. Anemia of Chronic kidney disease  Lab Results  Component Value Date   HGB 8.9 (L) 06/16/2020  Decreased since admission Will monitor this level.    #. Diabetes type 2 with CKD Hgb A1c MFr Bld (%)  Date Value  06/11/2020 10.7 (H)  Discussed need for stricter blood sugar controls  # LE edema and uncontrolled HTN See above Lasix    LOS: 2 Shantelle  [facility] 2/25/20222:52 PM  [place], [place]

## 2020-06-17 NOTE — Progress Notes (Signed)
Discharge instructions reviewed with patient. Vitals signs WDL at time of discharge.

## 2020-06-22 LAB — ALDOSTERONE + RENIN ACTIVITY W/ RATIO
ALDO / PRA Ratio: <5 (ref 0.0–30.0)
Aldosterone: <1 ng/dL (ref 0.0–30.0)
PRA LC/MS/MS: 0.202 ng/mL/hr (ref 0.167–5.380)

## 2020-06-22 LAB — ANCA TITERS
Atypical P-ANCA titer: <1:20 {titer}
C-ANCA: <1:20 {titer}
P-ANCA: <1:20 {titer}

## 2020-06-22 LAB — C3 COMPLEMENT: C3 Complement: 166 mg/dL (ref 82–167)

## 2020-06-22 LAB — PARATHYROID HORMONE, INTACT (NO CA): PTH: 54 pg/mL (ref 15–65)

## 2020-06-22 LAB — MPO/PR-3 (ANCA) ANTIBODIES
ANCA Proteinase 3: <3.5 U/mL (ref 0.0–3.5)
Myeloperoxidase Abs: <9 U/mL (ref 0.0–9.0)

## 2020-06-22 LAB — KAPPA/LAMBDA LIGHT CHAINS
Kappa free light chain: 92.9 mg/L — ABNORMAL HIGH (ref 3.3–19.4)
Kappa, lambda light chain ratio: 3.07 — ABNORMAL HIGH (ref 0.26–1.65)
Lambda free light chains: 30.3 mg/L — ABNORMAL HIGH (ref 5.7–26.3)

## 2020-06-22 LAB — C4 COMPLEMENT: Complement C4, Body Fluid: 52 mg/dL — ABNORMAL HIGH (ref 12–38)

## 2020-06-22 LAB — GLOMERULAR BASEMENT MEMBRANE ANTIBODIES: GBM Ab: 4 units (ref 0–20)

## 2020-06-23 DIAGNOSIS — N186 End stage renal disease: Secondary | ICD-10-CM | POA: Insufficient documentation

## 2020-06-23 DIAGNOSIS — N184 Chronic kidney disease, stage 4 (severe): Secondary | ICD-10-CM | POA: Insufficient documentation

## 2020-06-23 DIAGNOSIS — N189 Chronic kidney disease, unspecified: Secondary | ICD-10-CM | POA: Insufficient documentation

## 2020-09-15 DIAGNOSIS — G4700 Insomnia, unspecified: Secondary | ICD-10-CM | POA: Insufficient documentation

## 2020-11-07 ENCOUNTER — Ambulatory Visit (INDEPENDENT_AMBULATORY_CARE_PROVIDER_SITE_OTHER): Payer: BC Managed Care – PPO | Admitting: Vascular Surgery

## 2021-01-05 ENCOUNTER — Other Ambulatory Visit: Payer: Self-pay

## 2021-01-05 ENCOUNTER — Emergency Department: Payer: Self-pay

## 2021-01-05 ENCOUNTER — Inpatient Hospital Stay
Admission: EM | Admit: 2021-01-05 | Discharge: 2021-01-07 | DRG: 069 | Disposition: A | Discharge status: Home / Self Care | Payer: Self-pay | Attending: Family Medicine | Admitting: Family Medicine

## 2021-01-05 ENCOUNTER — Observation Stay: Payer: Self-pay

## 2021-01-05 DIAGNOSIS — Z20822 Contact with and (suspected) exposure to covid-19: Secondary | ICD-10-CM | POA: Diagnosis present

## 2021-01-05 DIAGNOSIS — D631 Anemia in chronic kidney disease: Secondary | ICD-10-CM | POA: Diagnosis present

## 2021-01-05 DIAGNOSIS — N1832 Chronic kidney disease, stage 3b: Secondary | ICD-10-CM | POA: Diagnosis present

## 2021-01-05 DIAGNOSIS — E1165 Type 2 diabetes mellitus with hyperglycemia: Secondary | ICD-10-CM | POA: Diagnosis present

## 2021-01-05 DIAGNOSIS — Z79899 Other long term (current) drug therapy: Secondary | ICD-10-CM

## 2021-01-05 DIAGNOSIS — E876 Hypokalemia: Secondary | ICD-10-CM | POA: Diagnosis present

## 2021-01-05 DIAGNOSIS — J45909 Unspecified asthma, uncomplicated: Secondary | ICD-10-CM | POA: Diagnosis present

## 2021-01-05 DIAGNOSIS — Z8249 Family history of ischemic heart disease and other diseases of the circulatory system: Secondary | ICD-10-CM

## 2021-01-05 DIAGNOSIS — I82409 Acute embolism and thrombosis of unspecified deep veins of unspecified lower extremity: Secondary | ICD-10-CM

## 2021-01-05 DIAGNOSIS — G453 Amaurosis fugax: Secondary | ICD-10-CM | POA: Diagnosis present

## 2021-01-05 DIAGNOSIS — I82511 Chronic embolism and thrombosis of right femoral vein: Secondary | ICD-10-CM | POA: Diagnosis present

## 2021-01-05 DIAGNOSIS — R9431 Abnormal electrocardiogram [ECG] [EKG]: Secondary | ICD-10-CM | POA: Diagnosis present

## 2021-01-05 DIAGNOSIS — Z7984 Long term (current) use of oral hypoglycemic drugs: Secondary | ICD-10-CM

## 2021-01-05 DIAGNOSIS — I639 Cerebral infarction, unspecified: Secondary | ICD-10-CM | POA: Diagnosis present

## 2021-01-05 DIAGNOSIS — G459 Transient cerebral ischemic attack, unspecified: Principal | ICD-10-CM | POA: Diagnosis present

## 2021-01-05 DIAGNOSIS — E1122 Type 2 diabetes mellitus with diabetic chronic kidney disease: Secondary | ICD-10-CM | POA: Diagnosis present

## 2021-01-05 DIAGNOSIS — Z91048 Other nonmedicinal substance allergy status: Secondary | ICD-10-CM

## 2021-01-05 DIAGNOSIS — I82531 Chronic embolism and thrombosis of right popliteal vein: Secondary | ICD-10-CM | POA: Diagnosis present

## 2021-01-05 DIAGNOSIS — I129 Hypertensive chronic kidney disease with stage 1 through stage 4 chronic kidney disease, or unspecified chronic kidney disease: Secondary | ICD-10-CM | POA: Diagnosis present

## 2021-01-05 LAB — DIFFERENTIAL
Abs Immature Granulocytes: 0.04 10*3/uL (ref 0.00–0.07)
Basophils Absolute: 0.1 10*3/uL (ref 0.0–0.1)
Basophils Relative: 1 %
Eosinophils Absolute: 0.2 10*3/uL (ref 0.0–0.5)
Eosinophils Relative: 2 %
Immature Granulocytes: 1 %
Lymphocytes Relative: 27 %
Lymphs Abs: 2.2 10*3/uL (ref 0.7–4.0)
Monocytes Absolute: 0.6 10*3/uL (ref 0.1–1.0)
Monocytes Relative: 8 %
Neutro Abs: 5.3 10*3/uL (ref 1.7–7.7)
Neutrophils Relative %: 61 %

## 2021-01-05 LAB — COMPREHENSIVE METABOLIC PANEL
ALT: 33 U/L (ref 0–44)
AST: 38 U/L (ref 15–41)
Albumin: 2.2 g/dL — ABNORMAL LOW (ref 3.5–5.0)
Alkaline Phosphatase: 183 U/L — ABNORMAL HIGH (ref 38–126)
Anion gap: 8 (ref 5–15)
BUN: 22 mg/dL — ABNORMAL HIGH (ref 6–20)
CO2: 29 mmol/L (ref 22–32)
Calcium: 8.2 mg/dL — ABNORMAL LOW (ref 8.9–10.3)
Chloride: 101 mmol/L (ref 98–111)
Creatinine, Ser: 2.05 mg/dL — ABNORMAL HIGH (ref 0.61–1.24)
GFR, Estimated: 40 mL/min — ABNORMAL LOW (ref >60)
Glucose, Bld: 177 mg/dL — ABNORMAL HIGH (ref 70–99)
Potassium: 3 mmol/L — ABNORMAL LOW (ref 3.5–5.1)
Sodium: 138 mmol/L (ref 135–145)
Total Bilirubin: 0.8 mg/dL (ref 0.3–1.2)
Total Protein: 5.5 g/dL — ABNORMAL LOW (ref 6.5–8.1)

## 2021-01-05 LAB — CBC
HCT: 26.3 % — ABNORMAL LOW (ref 39.0–52.0)
Hemoglobin: 9.3 g/dL — ABNORMAL LOW (ref 13.0–17.0)
MCH: 31 pg (ref 26.0–34.0)
MCHC: 35.4 g/dL (ref 30.0–36.0)
MCV: 87.7 fL (ref 80.0–100.0)
Platelets: 295 10*3/uL (ref 150–400)
RBC: 3 MIL/uL — ABNORMAL LOW (ref 4.22–5.81)
RDW: 13.5 % (ref 11.5–15.5)
WBC: 8.5 10*3/uL (ref 4.0–10.5)
nRBC: 0 % (ref 0.0–0.2)

## 2021-01-05 LAB — PROTIME-INR
INR: 1 (ref 0.8–1.2)
Prothrombin Time: 13.1 seconds (ref 11.4–15.2)

## 2021-01-05 LAB — CBG MONITORING, ED: Glucose-Capillary: 138 mg/dL — ABNORMAL HIGH (ref 70–99)

## 2021-01-05 LAB — RESP PANEL BY RT-PCR (FLU A&B, COVID) ARPGX2
Influenza A by PCR: NEGATIVE
Influenza B by PCR: NEGATIVE
SARS Coronavirus 2 by RT PCR: NEGATIVE

## 2021-01-05 LAB — APTT: aPTT: 29 seconds (ref 24–36)

## 2021-01-05 LAB — LDL CHOLESTEROL, DIRECT: Direct LDL: 150.9 mg/dL — ABNORMAL HIGH (ref 0–99)

## 2021-01-05 IMAGING — MR MR MRA HEAD W/O CM
1 series · 18 of 48 positions shown · non-contrast
Comparison: Head CT earlier same day.  MRI [DATE].

CLINICAL DATA: Neurological deficit, acute, stroke suspected.
Vision loss.



[Series 9: TOF · axial · 0.5mm · 0.41mm/px · z∈[-92,+5]mm · 18 of 205 slices shown]
[im 1/205]
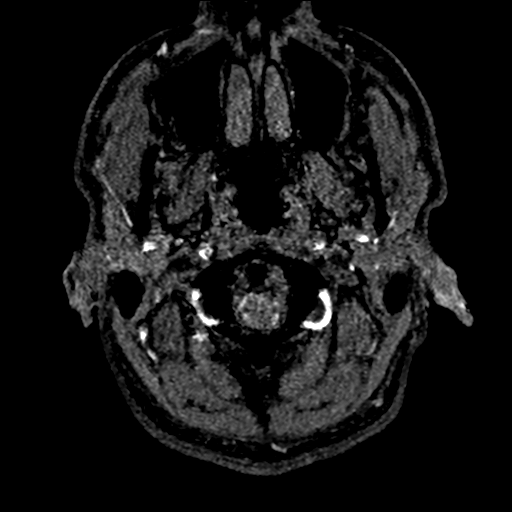
[im 5/205]
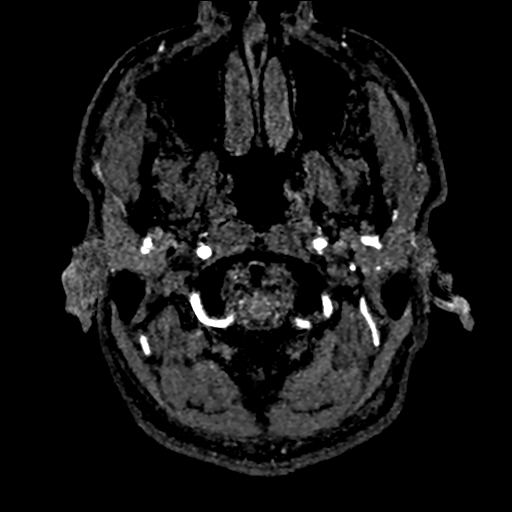
[im 9/205]
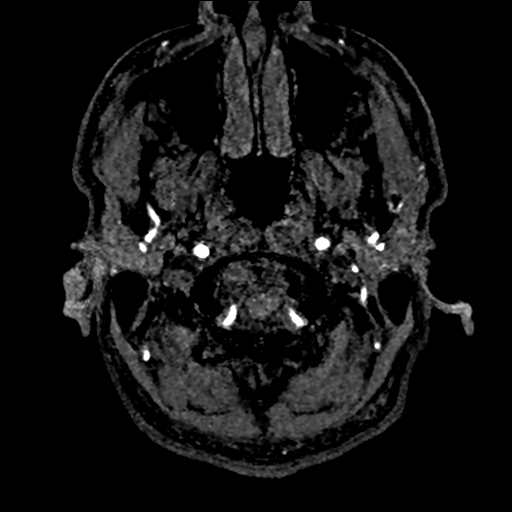
[im 14/205]
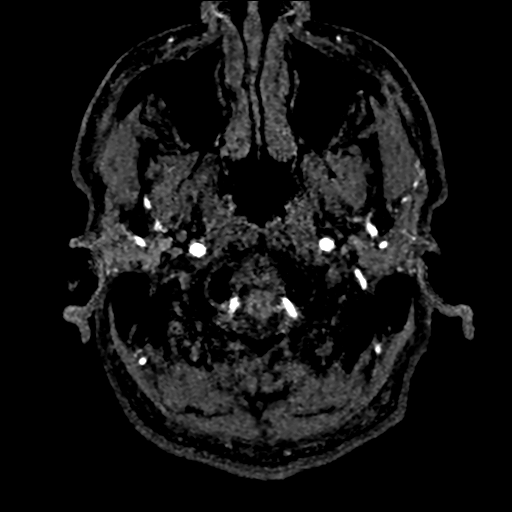
[im 18/205]
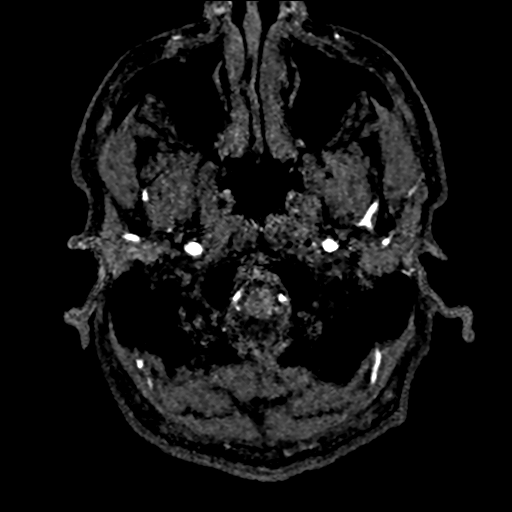
[im 22/205]
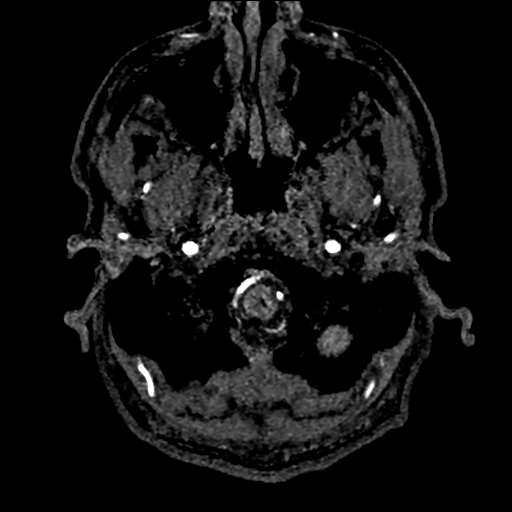
[im 27/205]
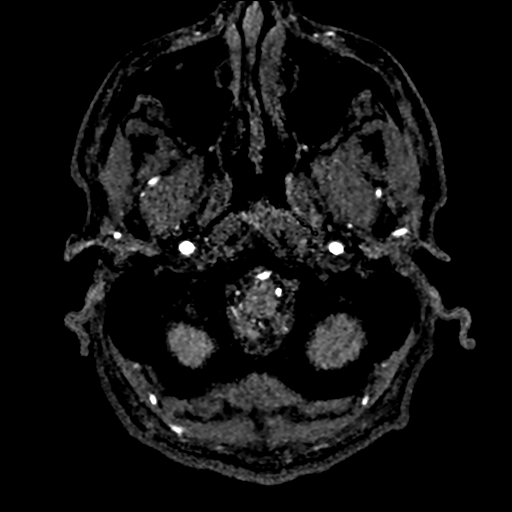
[im 31/205]
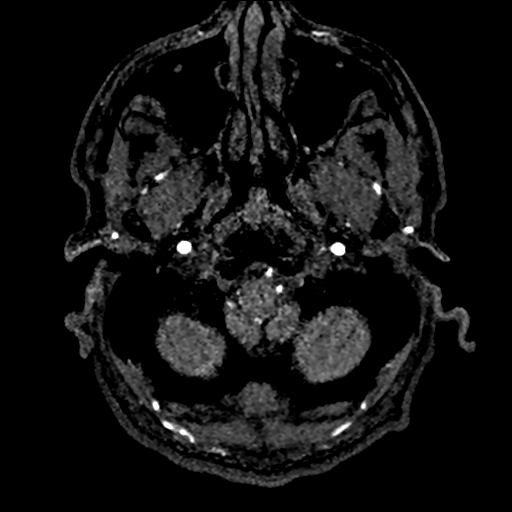
[im 35/205]
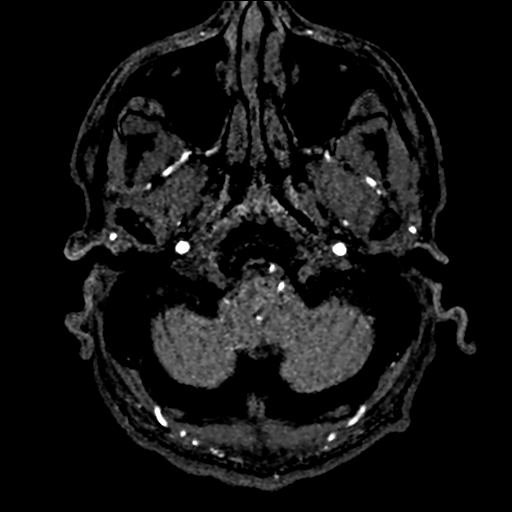
[im 40/205]
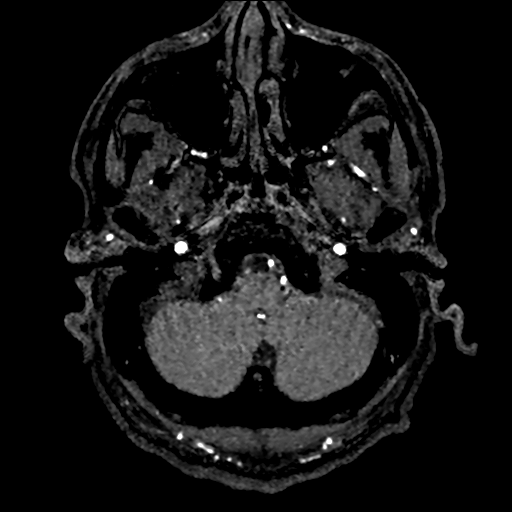
[im 66/205]
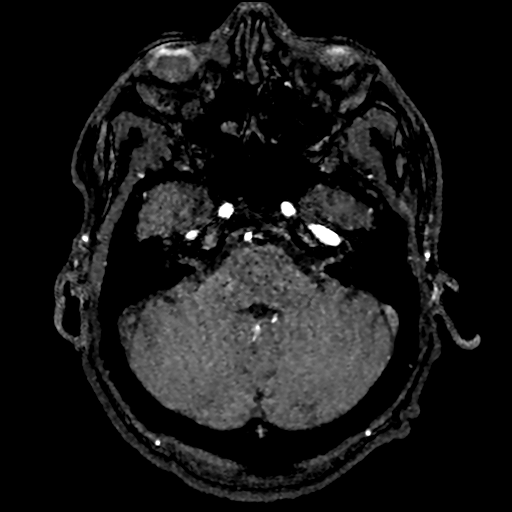
[im 92/205]
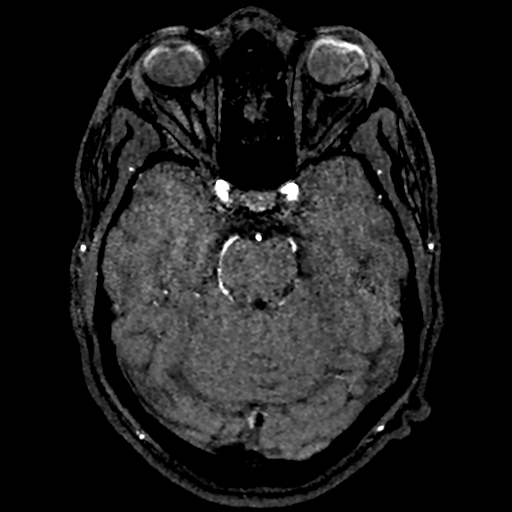
[im 105/205]
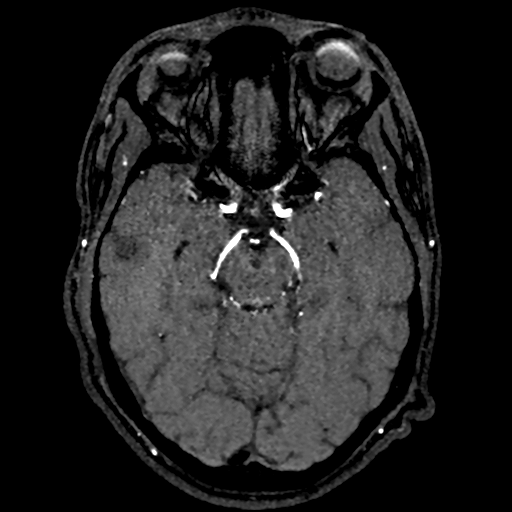
[im 118/205]
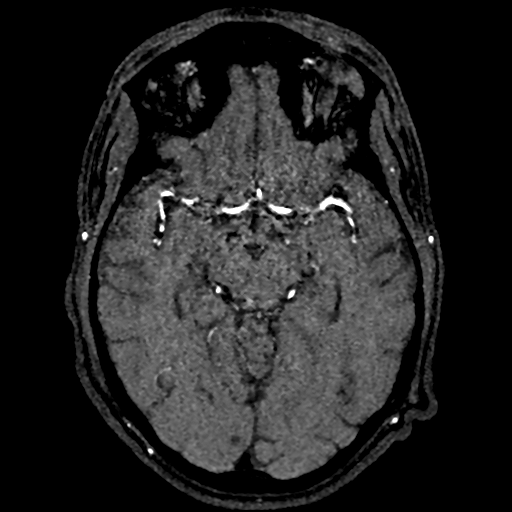
[im 144/205]
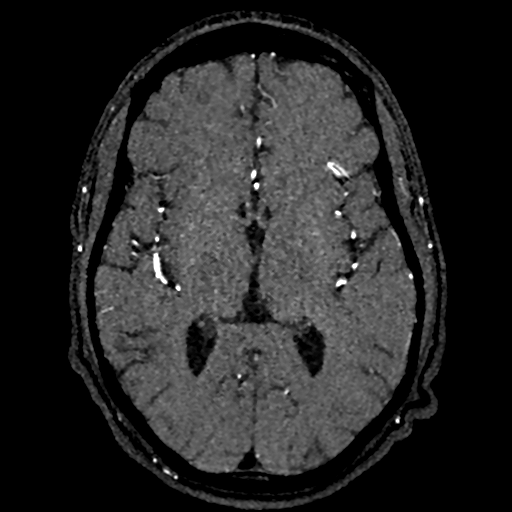
[im 170/205]
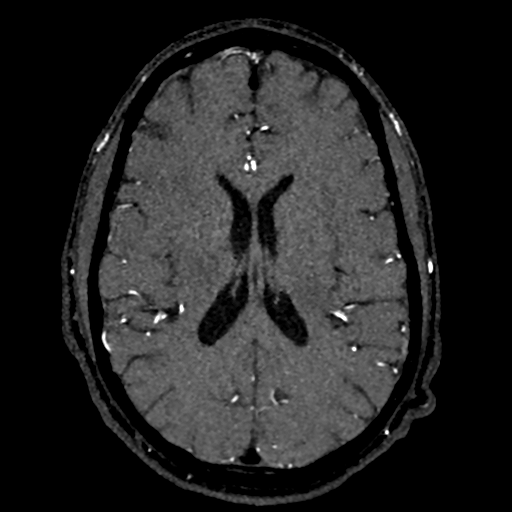
[im 174/205]
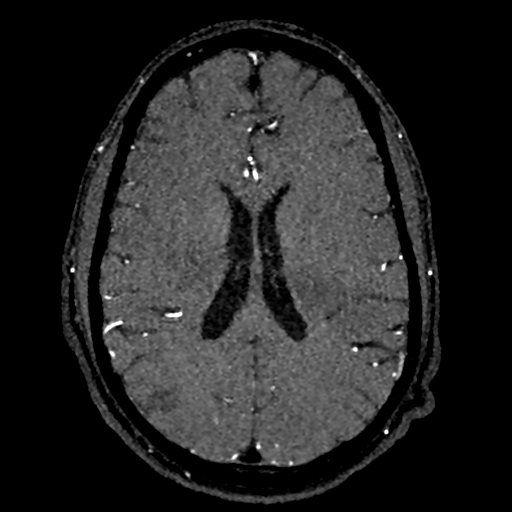
[im 196/205]
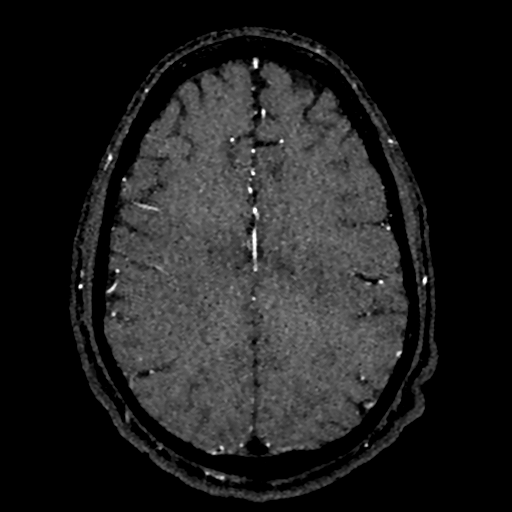

[18 of 48 positions shown; findings below may reference images not displayed]

FINDINGS: MRI HEAD FINDINGS

Brain: Diffusion imaging does not show any acute or subacute
infarction. No focal abnormality affects the brainstem or
cerebellum. There is minimal, smudgy T2 and FLAIR signal within the
white matter of the cerebral hemispheres, most notable in the
forceps major regions. This could be developmental or could indicate
an early manifestation of small vessel change. No cortical or large
vessel territory infarction. No mass lesion, hemorrhage,
hydrocephalus or extra-axial collection.

Vascular: Major vessels at the base of the brain show flow. Venous
sinuses appear patent.

Skull and upper cervical spine: Normal.

Sinuses/Orbits: Clear/normal.

Other: None significant.

MRA HEAD FINDINGS

Both internal carotid arteries are widely patent into the brain. No
siphon stenosis. The anterior and middle cerebral vessels are patent
without proximal stenosis, aneurysm or vascular malformation.

Both vertebral arteries are widely patent to the basilar. No basilar
stenosis. Posterior circulation branch vessels appear normal. Fetal
origin of both posterior cerebral arteries.

MRA NECK FINDINGS

Both common carotid arteries are patent to the bifurcations. Both
carotid bifurcations are normal. Antegrade flow in both vertebral
arteries.
IMPRESSION: Normal noncontrast MR angiography of the neck vessels and
intracranial vessels.

No acute or reversible brain finding. Minimal, smudgy T2 and FLAIR
signal within the hemispheric white matter of the forceps major
regions. This could be developmental or could relate to minimal
small-vessel change.

## 2021-01-05 MED ORDER — HYDRALAZINE HCL 50 MG PO TABS
50.0000 mg | ORAL_TABLET | Freq: Two times a day (BID) | ORAL | Status: DC
  Administered 2021-01-05 – 2021-01-06 (×3): 50 mg via ORAL
  Filled 2021-01-05 (×3): qty 1

## 2021-01-05 MED ORDER — MELATONIN 5 MG PO TABS
2.5000 mg | ORAL_TABLET | Freq: Every evening | ORAL | Status: DC | PRN
  Filled 2021-01-05: qty 0.5

## 2021-01-05 MED ORDER — ACETAMINOPHEN 325 MG PO TABS
650.0000 mg | ORAL_TABLET | Freq: Four times a day (QID) | ORAL | Status: DC | PRN
  Administered 2021-01-06 (×2): 650 mg via ORAL
  Filled 2021-01-05 (×2): qty 2

## 2021-01-05 MED ORDER — INSULIN ASPART 100 UNIT/ML IJ SOLN: 0.0000 [IU] | Freq: Every day | INTRAMUSCULAR | Status: DC

## 2021-01-05 MED ORDER — POTASSIUM CHLORIDE CRYS ER 20 MEQ PO TBCR
40.0000 meq | EXTENDED_RELEASE_TABLET | Freq: Once | ORAL | Status: AC
  Administered 2021-01-06: 40 meq via ORAL
  Filled 2021-01-05: qty 2

## 2021-01-05 MED ORDER — ENOXAPARIN SODIUM 40 MG/0.4ML IJ SOSY
40.0000 mg | PREFILLED_SYRINGE | INTRAMUSCULAR | Status: DC
  Administered 2021-01-05: 40 mg via SUBCUTANEOUS
  Filled 2021-01-05 (×2): qty 0.4

## 2021-01-05 MED ORDER — PROCHLORPERAZINE EDISYLATE 10 MG/2ML IJ SOLN
10.0000 mg | Freq: Four times a day (QID) | INTRAMUSCULAR | Status: DC | PRN
  Filled 2021-01-05: qty 2

## 2021-01-05 MED ORDER — HYDRALAZINE HCL 20 MG/ML IJ SOLN
5.0000 mg | Freq: Four times a day (QID) | INTRAMUSCULAR | Status: DC | PRN
  Administered 2021-01-06 – 2021-01-07 (×3): 5 mg via INTRAVENOUS
  Filled 2021-01-05 (×3): qty 1

## 2021-01-05 MED ORDER — INSULIN ASPART 100 UNIT/ML IJ SOLN
0.0000 [IU] | Freq: Three times a day (TID) | INTRAMUSCULAR | Status: DC
  Administered 2021-01-06: 2 [IU] via SUBCUTANEOUS
  Administered 2021-01-06: 1 [IU] via SUBCUTANEOUS
  Administered 2021-01-07: 2 [IU] via SUBCUTANEOUS
  Filled 2021-01-05 (×3): qty 1

## 2021-01-05 MED ORDER — POLYETHYLENE GLYCOL 3350 17 G PO PACK: 17.0000 g | PACK | Freq: Every day | ORAL | Status: DC | PRN

## 2021-01-05 MED ORDER — METOPROLOL TARTRATE 50 MG PO TABS
50.0000 mg | ORAL_TABLET | Freq: Two times a day (BID) | ORAL | Status: DC
  Administered 2021-01-05 – 2021-01-07 (×4): 50 mg via ORAL
  Filled 2021-01-05 (×4): qty 1

## 2021-01-05 MED ORDER — SODIUM CHLORIDE 0.9% FLUSH: 3.0000 mL | Freq: Once | INTRAVENOUS | Status: DC

## 2021-01-05 MED ORDER — HYDRALAZINE HCL 20 MG/ML IJ SOLN
10.0000 mg | Freq: Once | INTRAMUSCULAR | Status: AC
  Administered 2021-01-05: 10 mg via INTRAVENOUS

## 2021-01-05 NOTE — Progress Notes (Signed)
Neurology Telephone Note  I was called by Dr. Cinda Quest about this patient with hx DM2, HTN, detached retina who presented with 2 episodes transient vision loss c/f amaurosis fugax, now resolved. CTH NAICP.  Interim recommendations:  - Admit to hospitalist service for TIA w/u - Permissive HTN x48 hrs from sx onset or until stroke ruled out by MRI goal BP <220/110. PRN labetalol or hydralazine if BP above these parameters. Avoid oral antihypertensives. - MRI brain wo contrast - MRA H&N wo contrast - TTE w/ bubble - Check A1c and LDL + add statin per guidelines - Pt has a history of DVT and was previously on xarelto. If patient is currently taking xarelto and there is indication to continue for DVT hx, ok to continue given no current new neurologic deficits. If he is not currently on anticoagulation then please start on dual antiplatelet ASA '81mg'$  daily + plavix '75mg'$  daily. There is no neurologic indication for antiplatelets in addition to therapeutic anticoagulation.  - q4 hr neuro checks - STAT head CT for any change in neuro exam - Tele - PT/OT/SLP - Stroke education  I will formally consult on patient in AM.  Su Monks, MD [facility] [phone]  If 7pm- 7am, please page neurology on call as listed in [place].

## 2021-01-05 NOTE — ED Provider Notes (Signed)
[facility] Emergency Department Provider Note  ____________________________________________   Event Date/Time   First MD Initiated Contact with Patient 01/05/21 1637     (approximate)  I have reviewed the triage vital signs and the nursing notes.   HISTORY  Chief Complaint Hypertension  HPI Bradley Lopez is a 45 y.o. male with history of diabetes and hypertension and detached retina who reports he was walking with his sister and noticed his vision was going out and then he lost it.  The vision came back when he sat down and then he got up to walk again and the vision went out completely.  Patient vision has now come back again.  It is about the same as it was this morning.      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 ([place]) 06/14/2020   Anemia due to chronic kidney disease 06/14/2020   Hypertensive encephalopathy syndrome    Vision blurring    Nausea & vomiting 06/11/2020   Anasarca 06/11/2020   Elevated troponin 06/11/2020   Hypomagnesemia 06/11/2020   CKD stage 3 due to type 2 diabetes mellitus ([place]) 06/11/2020   Hypertensive emergency 06/10/2020   Lymphedema 05/28/2020   Pain and swelling of lower leg 05/02/2020   Acute right-sided low back pain without sciatica 11/21/2018   History of DVT of lower extremity 11/21/2018   Mixed hyperlipidemia 11/21/2018   DVT femoral (deep venous thrombosis) with thrombophlebitis, bilateral ([place]) 08/20/2018   Essential hypertension 08/20/2018   Generalized anxiety disorder 08/20/2018    History reviewed. No pertinent surgical  history.  Prior to Admission medications   Medication Sig Start Date End Date Taking? Authorizing Provider  amLODipine (NORVASC) 10 MG tablet Take 10 mg by mouth daily. 07/28/20   [provider]  apixaban (ELIQUIS) 5 MG TABS tablet Take 1 tablet (5 mg total) by mouth 2 (two) times daily. 06/17/20   Sharen Hones, MD  atorvastatin (LIPITOR) 40 MG tablet Take 40 mg by mouth at bedtime. 05/17/20   [provider]  cloNIDine (CATAPRES - DOSED IN MG/24 HR) 0.2 mg/24hr patch Place 1 patch (0.2 mg total) onto the skin once a week. 06/21/20   Sharen Hones, MD  cloNIDine (CATAPRES) 0.1 MG tablet Take 0.1 mg by mouth 2 (two) times daily. 12/15/20   [provider]  furosemide (LASIX) 40 MG tablet Take 1 tablet (40 mg total) by mouth 2 (two) times daily. 06/17/20   Sharen Hones, MD  glipiZIDE (GLUCOTROL XL) 5 MG 24 hr tablet Take 5 mg by mouth daily. 08/20/20   [provider]  hydrALAZINE (APRESOLINE) 50 MG tablet Take 50 mg by mouth 2 (two) times daily. 10/10/20   [provider]  lisinopril (ZESTRIL) 20 MG tablet Take 20 mg by mouth daily. 09/15/20   [provider]  metoCLOPramide (REGLAN) 5 MG tablet Take 1 tablet (5 mg total) by mouth every 8 (eight) hours as needed for up to 7 days for nausea. 06/17/20 06/24/20  Sharen Hones, MD  metoprolol tartrate (LOPRESSOR) 50 MG tablet Take 1 tablet (50 mg total) by mouth 2 (two)  times daily. 06/17/20   Sharen Hones, MD  ondansetron (ZOFRAN) 4 MG tablet Take 1 tablet (4 mg total) by mouth every 6 (six) hours as needed for nausea. 06/17/20   Sharen Hones, MD    Allergies Pine  Family History  Problem Relation Age of Onset   Hypertension Mother     Social History Social History   Tobacco Use   Smoking status: Never   Smokeless tobacco: Never  Substance Use Topics   Alcohol use: Not Currently   Drug use: Not Currently    Review of Systems  Constitutional: No fever/chills Eyes: No visual changes. ENT: No  sore throat. Cardiovascular: Denies chest pain. Respiratory: Denies shortness of breath. Gastrointestinal: No abdominal pain.  No nausea, no vomiting.  No diarrhea.  No constipation. Genitourinary: Negative for dysuria. Musculoskeletal: Negative for back pain. Skin: Negative for rash. Neurological: Negative for headaches, focal weakness   ____________________________________________   PHYSICAL EXAM:  VITAL SIGNS: ED Triage Vitals  Enc Vitals Group     BP 01/05/21 1621 (!) 160/103     Pulse Rate 01/05/21 1621 83     Resp 01/05/21 1621 19     Temp 01/05/21 1627 98.5 F (36.9 C)     Temp Source 01/05/21 1627 Oral     SpO2 01/05/21 1621 100 %     Weight 01/05/21 1622 199 lb (90.3 kg)     Height 01/05/21 1622 '6\' 3"'$  (1.905 m)     Head Circumference --      Peak Flow --      Pain Score 01/05/21 1622 0     Pain Loc --      Pain Edu? --      Excl. in [place]? --     Constitutional: Alert and oriented. Well appearing and in no acute distress. Eyes: Conjunctivae are normal. PER. EOMI. Head: Atraumatic. Nose: No congestion/rhinnorhea. Mouth/Throat: Mucous membranes are moist.  Oropharynx non-erythematous. Neck: No stridor.   Cardiovascular: Normal rate, regular rhythm. Grossly normal heart sounds.  Good peripheral circulation. Respiratory: Normal respiratory effort.  No retractions. Lungs CTAB. Gastrointestinal: Soft and nontender. No distention. No abdominal bruits. No CVA tenderness. Musculoskeletal: No lower extremity tenderness nor edema.   Neurologic:  Normal speech and language. No gross focal neurologic deficits are appreciated. No gait instability. Skin:  Skin is warm, dry and intact. No rash noted.   ____________________________________________   LABS (all labs ordered are listed, but only abnormal results are displayed)  Labs Reviewed  CBC - Abnormal; Notable for the following components:      Result Value   RBC 3.00 (*)    Hemoglobin 9.3 (*)    HCT 26.3 (*)    All  other components within normal limits  COMPREHENSIVE METABOLIC PANEL - Abnormal; Notable for the following components:   Potassium 3.0 (*)    Glucose, Bld 177 (*)    BUN 22 (*)    Creatinine, Ser 2.05 (*)    Calcium 8.2 (*)    Total Protein 5.5 (*)    Albumin 2.2 (*)    Alkaline Phosphatase 183 (*)    GFR, Estimated 40 (*)    All other components within normal limits  RESP PANEL BY RT-PCR (FLU A&B, COVID) ARPGX2  PROTIME-INR  APTT  DIFFERENTIAL  CBG MONITORING, ED   ____________________________________________  EKG EKG read interpreted by me shows normal sinus rhythm at 82 normal axis.  There appears to be something of a slurred upstroke of the QRS.  Suspicious for  possible WPW although his PR interval is not short.  ____________________________________________  RADIOLOGY Gertha Calkin, personally viewed and evaluated these images (plain radiographs) as part of my medical decision making, as well as reviewing the written report by the radiologist.  ED MD interpretation:    Official radiology report(s): No results found.  ____________________________________________   PROCEDURES  Procedure(s) performed (including Critical Care):  Procedures   ____________________________________________   INITIAL IMPRESSION / ASSESSMENT AND PLAN / ED COURSE  Discussed patient with Dr. Quinn Axe neurology.  We will get him in for possible TIA.  Cardiology needs to double check his EKG and make sure there is no risk for WPW              ____________________________________________   FINAL CLINICAL IMPRESSION(S) / ED DIAGNOSES  Final diagnoses:  TIA (transient ischemic attack)     ED Discharge Orders     None        Note:  This document was prepared using Dragon voice recognition software and may include unintentional dictation errors.    Nena Polio, MD 01/05/21 [phone]

## 2021-01-05 NOTE — ED Notes (Signed)
Pt given meal tray.

## 2021-01-05 NOTE — ED Notes (Signed)
Pt to MRI

## 2021-01-05 NOTE — H&P (Addendum)
QTC History and Physical  Rocklin Omura [id number] DOB: 19-Dec-1975 DOA: 01/05/2021  Referring physician: Dr. Rip Harbour, [place]. PCP: Baxter Hire, MD  Outpatient Specialists: Ophthalmology Patient coming from: Home.  Chief Complaint: Loss of vision  HPI: Bradley Lopez is a 45 y.o. male with medical history significant for type 2 diabetes, hypertension, bilateral detached retina, right lower extremity DVT, CKD 3B, who presented to [facility] ED after bilateral vision loss around 2:45 PM that lasted about 5 to 10 minutes.  Patient was walking with his sister when all of a sudden his vision became blurry, they walked to her car and he completely lost his vision when they were inside the car.  She called 911.  He vomited once and his vision returned prior to EMS arrival.  He was diagnosed with bilateral detached retina in June 10, 2020 and follows with ophthalmology.  His next appointment is on 02/06/2021.  Patient was brought to the ED for further evaluation.  While in the ED his vision was at baseline.  He denies any recurrence of loss of vision.  EDP requested admission for TIA work-up.    ED Course: Temperature 98.5.  BP 192/120, heart rate 101, respiration rate 18, O2 saturation 97% on room air.  Lab studies remarkable for potassium 3.0, glucose 177, creatinine 2.05, hemoglobin 9.3, MCV 87.  Review of Systems: Review of systems as noted in the HPI. All other systems reviewed and are negative.   Past Medical History:  Diagnosis Date   Asthma    Diabetes mellitus without complication ([place])    Hypertension    History reviewed. No pertinent surgical history.  Social History:  reports that he has never smoked. He has never used smokeless tobacco. He reports that he does not currently use alcohol. He reports that he does not currently use drugs.   Allergies  Allergen Reactions   Pine Hives and Itching    Family History  Problem Relation Age of Onset   Hypertension Mother       Prior to  Admission medications   Medication Sig Start Date End Date Taking? Authorizing Provider  amLODipine (NORVASC) 10 MG tablet Take 10 mg by mouth daily. 07/28/20   [provider]  apixaban (ELIQUIS) 5 MG TABS tablet Take 1 tablet (5 mg total) by mouth 2 (two) times daily. 06/17/20   Sharen Hones, MD  atorvastatin (LIPITOR) 40 MG tablet Take 40 mg by mouth at bedtime. 05/17/20   [provider]  cloNIDine (CATAPRES - DOSED IN MG/24 HR) 0.2 mg/24hr patch Place 1 patch (0.2 mg total) onto the skin once a week. 06/21/20   Sharen Hones, MD  cloNIDine (CATAPRES) 0.1 MG tablet Take 0.1 mg by mouth 2 (two) times daily. 12/15/20   [provider]  furosemide (LASIX) 40 MG tablet Take 1 tablet (40 mg total) by mouth 2 (two) times daily. 06/17/20   Sharen Hones, MD  glipiZIDE (GLUCOTROL XL) 5 MG 24 hr tablet Take 5 mg by mouth daily. 08/20/20   [provider]  hydrALAZINE (APRESOLINE) 50 MG tablet Take 50 mg by mouth 2 (two) times daily. 10/10/20   [provider]  lisinopril (ZESTRIL) 20 MG tablet Take 20 mg by mouth daily. 09/15/20   [provider]  metoCLOPramide (REGLAN) 5 MG tablet Take 1 tablet (5 mg total) by mouth every 8 (eight) hours as needed for up to 7 days for nausea. 06/17/20 06/24/20  Sharen Hones, MD  metoprolol tartrate (LOPRESSOR) 50 MG tablet Take 1  tablet (50 mg total) by mouth 2 (two) times daily. 06/17/20   Sharen Hones, MD  ondansetron (ZOFRAN) 4 MG tablet Take 1 tablet (4 mg total) by mouth every 6 (six) hours as needed for nausea. 06/17/20   Sharen Hones, MD    Physical Exam: BP (!) 160/103   Pulse 83   Temp 98.5 F (36.9 C) (Oral)   Resp 19   Ht '6\' 3"'$  (1.905 m)   Wt 90.3 kg   SpO2 100%   BMI 24.87 kg/m   General: 45 y.o. year-old male well developed well nourished in no acute distress.  Alert and oriented x3. Cardiovascular: Regular rate and rhythm with no rubs or gallops.  No thyromegaly or JVD noted.  No lower extremity edema. 2/4  pulses in all 4 extremities. Respiratory: Clear to auscultation with no wheezes or rales. Good inspiratory effort. Abdomen: Soft nontender nondistended with normal bowel sounds x4 quadrants. Muskuloskeletal: No cyanosis, clubbing or edema noted bilaterally Neuro: CN II-XII intact, strength, sensation, reflexes Skin: No ulcerative lesions noted or rashes Psychiatry: Judgement and insight appear normal. Mood is appropriate for condition and setting          Labs on Admission:  Basic Metabolic Panel: Recent Labs  Lab 01/05/21 1624  NA 138  K 3.0*  CL 101  CO2 29  GLUCOSE 177*  BUN 22*  CREATININE 2.05*  CALCIUM 8.2*   Liver Function Tests: Recent Labs  Lab 01/05/21 1624  AST 38  ALT 33  ALKPHOS 183*  BILITOT 0.8  PROT 5.5*  ALBUMIN 2.2*   No results for input(s): LIPASE, AMYLASE in the last 168 hours. No results for input(s): AMMONIA in the last 168 hours. CBC: Recent Labs  Lab 01/05/21 1624  WBC 8.5  NEUTROABS 5.3  HGB 9.3*  HCT 26.3*  MCV 87.7  PLT 295   Cardiac Enzymes: No results for input(s): CKTOTAL, CKMB, CKMBINDEX, TROPONINI in the last 168 hours.  BNP (last 3 results) Recent Labs    06/10/20 1029  BNP 922.0*    ProBNP (last 3 results) No results for input(s): PROBNP in the last 8760 hours.  CBG: No results for input(s): GLUCAP in the last 168 hours.  Radiological Exams on Admission: No results found.  EKG: I independently viewed the EKG done and my findings are as followed: Sinus rhythm rate of 82.  Nonspecific ST-T changes.  QTc 512.  Assessment/Plan Present on Admission:  TIA (transient ischemic attack)  Active Problems:   TIA (transient ischemic attack)  TIA EMS was activated after complete loss of vision, lasting 5 to 10 minutes Patient is currently back to baseline Admitted for TIA work-up Neurology consulted MRI brain without contrast, MRA head and neck without contrast 2D echo with bubble study A1c, fasting lipid  panel Frequent neurochecks Monitor on telemetry Stroke education  Essential hypertension, uncontrolled BP not at goal, elevated Resume home oral antihypertensives IV hydralazine as needed with parameters Monitor vital signs  Prolonged QTC QTC on presentation greater than 500 Avoid QTC prolonging agent Optimize magnesium and potassium levels Repeat twelve-lead EKG in the morning.  Bilateral retinal detachment Follows with ophthalmology Next appointment 02/06/2021  History of right lower extremity DVT, previously on Xarelto Patient has not been taking anticoagulation due to cost Obtain B/L Doppler ultrasound  Hypokalemia Repleted  CKD 3B, at baseline Avoid nephrotoxic agents Monitor  Anemia of chronic disease in the setting of CKD Hemoglobin stable Monitor  Type 2 diabetes with hyperglycemia Last hemoglobin A1c 10.7 on  06/11/2020 Obtain hemoglobin A1c Start insulin sliding scale.  DVT prophylaxis: Subcu Lovenox daily  Code Status: Full code  Family Communication: None at bedside  Disposition Plan: Admit to MedSurg unit with remote telemetry  Consults called: Neurology consulted by EDP  Admission status: Observation status   Status is: Observation    Dispo: The patient is from: Home.              Anticipated d/c is to: Home.              Patient currently not medically stable for discharge.   Difficult to place patient not applicable.       Kayleen Memos MD [facility] Pager [phone]  If 7PM-7AM, please contact night-coverage [email or web address] Password [facility]  01/05/2021, 5:43 PM

## 2021-01-05 NOTE — ED Notes (Signed)
Admitting MD notified of blood pressure

## 2021-01-05 NOTE — ED Notes (Signed)
US at bedside

## 2021-01-05 NOTE — ED Triage Notes (Signed)
First Nurse: Pt brought in by [facility] with complaints of hypertension and loss of vision. He recently increased his Lasix from 40 mg to 80 mg. He has lost approx 20 lbs in the last few weeks. When getting into WC from stretcher pt lost his vision again

## 2021-01-05 NOTE — ED Notes (Signed)
Pt taken for CT 

## 2021-01-05 NOTE — ED Triage Notes (Addendum)
See first nurse note- Pt reports walking today at 2pm . Reports being vision impaired at baseline but while walking he immediately lost all vision and became dizzy. Vision then came back, at this time patient reports his vision is back to how it was in February/ baseline (reports multiple surgeries since February/ BILATERAL retina detachment). Denies new weakness. No facial droop noted.   Pt reports as been retaining fluid so his father (retired MD), increased his lasix dose to '80mg'$  this week. Pt also been taking ED med, took first dose on Sunday and had chest pain and palpitations after.

## 2021-01-05 NOTE — ED Notes (Signed)
This RN attatched BP cuff and SPO2 to pt upon entering room, pt c/o headache and asking for food. Call bell placed on bed rail.

## 2021-01-06 ENCOUNTER — Encounter: Payer: Self-pay | Admitting: Family Medicine

## 2021-01-06 ENCOUNTER — Observation Stay (HOSPITAL_COMMUNITY)
Admit: 2021-01-06 | Discharge: 2021-01-06 | Disposition: A | Discharge status: Home / Self Care | Payer: Self-pay | Attending: Neurology | Admitting: Neurology

## 2021-01-06 ENCOUNTER — Other Ambulatory Visit: Payer: Self-pay

## 2021-01-06 DIAGNOSIS — G459 Transient cerebral ischemic attack, unspecified: Secondary | ICD-10-CM

## 2021-01-06 LAB — CBC
HCT: 27.8 % — ABNORMAL LOW (ref 39.0–52.0)
Hemoglobin: 10 g/dL — ABNORMAL LOW (ref 13.0–17.0)
MCH: 32.1 pg (ref 26.0–34.0)
MCHC: 36 g/dL (ref 30.0–36.0)
MCV: 89.1 fL (ref 80.0–100.0)
Platelets: 279 10*3/uL (ref 150–400)
RBC: 3.12 MIL/uL — ABNORMAL LOW (ref 4.22–5.81)
RDW: 13.6 % (ref 11.5–15.5)
WBC: 9.3 10*3/uL (ref 4.0–10.5)
nRBC: 0 % (ref 0.0–0.2)

## 2021-01-06 LAB — BASIC METABOLIC PANEL
Anion gap: 5 (ref 5–15)
BUN: 24 mg/dL — ABNORMAL HIGH (ref 6–20)
CO2: 30 mmol/L (ref 22–32)
Calcium: 8 mg/dL — ABNORMAL LOW (ref 8.9–10.3)
Chloride: 104 mmol/L (ref 98–111)
Creatinine, Ser: 2.07 mg/dL — ABNORMAL HIGH (ref 0.61–1.24)
GFR, Estimated: 40 mL/min — ABNORMAL LOW (ref >60)
Glucose, Bld: 183 mg/dL — ABNORMAL HIGH (ref 70–99)
Potassium: 3 mmol/L — ABNORMAL LOW (ref 3.5–5.1)
Sodium: 139 mmol/L (ref 135–145)

## 2021-01-06 LAB — LIPID PANEL
Cholesterol: 251 mg/dL — ABNORMAL HIGH (ref 0–200)
HDL: 62 mg/dL (ref >40)
LDL Cholesterol: 153 mg/dL — ABNORMAL HIGH (ref 0–99)
Total CHOL/HDL Ratio: 4 RATIO
Triglycerides: 178 mg/dL — ABNORMAL HIGH (ref <150)
VLDL: 36 mg/dL (ref 0–40)

## 2021-01-06 LAB — ECHOCARDIOGRAM COMPLETE BUBBLE STUDY
AR max vel: 1.92 cm2
AV Area VTI: 2.19 cm2
AV Area mean vel: 1.88 cm2
AV Mean grad: 3.7 mmHg
AV Peak grad: 6.5 mmHg
Ao pk vel: 1.27 m/s
Area-P 1/2: 3.53 cm2
S' Lateral: 3.3 cm

## 2021-01-06 LAB — MRSA NEXT GEN BY PCR, NASAL: MRSA by PCR Next Gen: NOT DETECTED

## 2021-01-06 LAB — MAGNESIUM: Magnesium: 1.8 mg/dL (ref 1.7–2.4)

## 2021-01-06 LAB — GLUCOSE, CAPILLARY: Glucose-Capillary: 182 mg/dL — ABNORMAL HIGH (ref 70–99)

## 2021-01-06 LAB — CBG MONITORING, ED
Glucose-Capillary: 152 mg/dL — ABNORMAL HIGH (ref 70–99)
Glucose-Capillary: 141 mg/dL — ABNORMAL HIGH (ref 70–99)
Glucose-Capillary: 132 mg/dL — ABNORMAL HIGH (ref 70–99)

## 2021-01-06 MED ORDER — ATORVASTATIN CALCIUM 20 MG PO TABS
80.0000 mg | ORAL_TABLET | Freq: Every day | ORAL | Status: DC
  Administered 2021-01-07: 08:00:00 80 mg via ORAL
  Filled 2021-01-06: qty 4

## 2021-01-06 MED ORDER — CLOPIDOGREL BISULFATE 75 MG PO TABS
75.0000 mg | ORAL_TABLET | Freq: Every day | ORAL | Status: DC
  Administered 2021-01-06 – 2021-01-07 (×2): 75 mg via ORAL
  Filled 2021-01-06 (×2): qty 1

## 2021-01-06 MED ORDER — ASPIRIN EC 81 MG PO TBEC
81.0000 mg | DELAYED_RELEASE_TABLET | Freq: Every day | ORAL | Status: DC
  Administered 2021-01-06 – 2021-01-07 (×2): 81 mg via ORAL
  Filled 2021-01-06 (×2): qty 1

## 2021-01-06 MED ORDER — LABETALOL HCL 5 MG/ML IV SOLN
10.0000 mg | Freq: Once | INTRAVENOUS | Status: AC
  Administered 2021-01-06: 10 mg via INTRAVENOUS
  Filled 2021-01-06: qty 4

## 2021-01-06 MED ORDER — LABETALOL HCL 5 MG/ML IV SOLN
10.0000 mg | INTRAVENOUS | Status: DC | PRN
  Administered 2021-01-06 – 2021-01-07 (×4): 10 mg via INTRAVENOUS
  Filled 2021-01-06 (×4): qty 4

## 2021-01-06 NOTE — Progress Notes (Signed)
*  PRELIMINARY RESULTS* Echocardiogram 2D Echocardiogram has been performed.  Sherrie Sport 01/06/2021, 11:19 AM

## 2021-01-06 NOTE — ED Notes (Signed)
Pt. C/o CP, dizziness and nausea

## 2021-01-06 NOTE — ED Notes (Signed)
Bradley Settler, np notified pt's BP stayed lower for a while, but i had risen back up to 180/106. Awaiting orders.

## 2021-01-06 NOTE — Consult Note (Addendum)
NEUROLOGY CONSULTATION NOTE   Date of service: January 06, 2021 Patient Name: Bradley Lopez MRN:  [id number] DOB:  1975/12/23 Reason for consult: TIA Requesting physician: Shawna Clamp MD _ _ _   _ __   _ __ _ _  __ __   _ __   __ _  History of Present Illness   45 yo man with hx significant for DM2, HTN, RLE DVT no longer on [facility], CKD stage IIIb, bilateral retinal detachment who presented to ED yesterday after 2 episodes of transient vision loss. Patient follows with ophtho for bilateral retinal detachment. At baseline he is unable to reliably differentiate between 1 and 2 fingers in any quadrant in either eye and he is unable to drive as a result of his vision problems. He was walking with his sister yesterday when he developed acute onset complete vision loss R eye. They sat down and it resolved. They started walking again and he felt nauseated and vomited once and lost complete vision bilaterally which resolved within minutes. Upon arrival to ED he had no acute neurologic deficits, baseline visual impairment. Admitted for TIA w/u. MRI brain wo contrast showed no acute infarct. MRA H&N normal. TTE no intracardiac clot or significant valvular abnl. LDL 153.  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 No pertinent surgical history. Family History  Problem Relation Age of Onset   Hypertension Mother    Social History   Socioeconomic History   Marital status: Single    Spouse name: Not on file   Number of children: Not on file   Years of education: Not on file   Highest education level: Not on file  Occupational History   Not on file  Tobacco Use   Smoking status: Never   Smokeless tobacco: Never  Vaping Use    Vaping Use: Never used  Substance and Sexual Activity   Alcohol use: Not Currently   Drug use: Not Currently   Sexual activity: Not on file  Other Topics Concern   Not on file  Social History Narrative   Not on file   Social Determinants of Health   Financial Resource Strain: Not on file  Food Insecurity: Not on file  Transportation Needs: Not on file  Physical Activity: Not on file  Stress: Not on file  Social Connections: Not on file   Allergies  Allergen Reactions   Pine Hives and Itching    Medications   Medications Prior to Admission  Medication Sig Dispense Refill Last Dose   atorvastatin (LIPITOR) 40 MG tablet Take 40 mg by mouth at bedtime.   01/04/2021 at 2200   cloNIDine (CATAPRES) 0.1 MG tablet Take 0.1 mg by mouth 2 (two) times daily.   01/05/2021 at 1000   furosemide (LASIX) 40 MG tablet Take 1 tablet (40 mg total) by mouth 2 (two) times daily. 30 tablet 0 01/05/2021 at 1000   glipiZIDE (GLUCOTROL XL) 5 MG 24 hr tablet Take 5 mg by mouth daily.   01/05/2021 at 1000   hydrALAZINE (APRESOLINE) 50 MG tablet Take 50 mg by mouth 2 (two) times daily.   01/05/2021 at 1000   metoprolol tartrate (LOPRESSOR) 50 MG tablet Take  1 tablet (50 mg total) by mouth 2 (two) times daily. 60 tablet 0 01/04/2021 at 2200   amLODipine (NORVASC) 10 MG tablet Take 10 mg by mouth daily. (Patient not taking: No sig reported)   Not Taking   apixaban (ELIQUIS) 5 MG TABS tablet Take 1 tablet (5 mg total) by mouth 2 (two) times daily. (Patient not taking: No sig reported) 60 tablet 0 Not Taking   cloNIDine (CATAPRES - DOSED IN MG/24 HR) 0.2 mg/24hr patch Place 1 patch (0.2 mg total) onto the skin once a week. (Patient not taking: No sig reported) 4 patch 0 Not Taking   lisinopril (ZESTRIL) 20 MG tablet Take 20 mg by mouth daily. (Patient not taking: No sig reported)   Not Taking   metoCLOPramide (REGLAN) 5 MG tablet Take 1 tablet (5 mg total) by mouth every 8 (eight) hours as needed for up to 7 days  for nausea. 21 tablet 0    ondansetron (ZOFRAN) 4 MG tablet Take 1 tablet (4 mg total) by mouth every 6 (six) hours as needed for nausea. (Patient not taking: No sig reported) 20 tablet 0 Not Taking   temazepam (RESTORIL) 15 MG capsule Take 15 mg by mouth at bedtime as needed for sleep.   unknown at prn     Vitals   Vitals:   01/06/21 1704 01/06/21 1730 01/06/21 1820 01/06/21 1943  BP: (!) 213/115 (!) 186/103 (!) 157/94 (!) 189/98  Pulse: 81 80 86 89  Resp: '14 11 14 18  '$ Temp:      TempSrc:      SpO2: 100% 100% 97% 100%  Weight:      Height:         Body mass index is 24.87 kg/m.  Physical Exam   Physical Exam Gen: A&O x4, NAD HEENT: Atraumatic, normocephalic;mucous membranes moist; oropharynx clear, tongue without atrophy or fasciculations. Neck: Supple, trachea midline. Resp: CTAB, no w/r/r CV: RRR, no m/g/r; nml S1 and S2. 2+ symmetric peripheral pulses. Abd: soft/NT/ND; nabs x 4 quad Extrem: Nml bulk; no cyanosis, clubbing, or edema.  Neuro: *MS: A&O x4. Follows multi-step commands.  *Speech: fluid, nondysarthric, able to name and repeat *CN:    I: Deferred   II,III: PERRLA, unable to differentiate between 1 and 2 fingers of any quadrant of either eye, patient states is baseline   III,IV,VI: EOMI w/o nystagmus, no ptosis   V: Sensation intact from V1 to V3 to LT   VII: Eyelid closure was full.  Smile symmetric.   VIII: Hearing intact to voice   IX,X: Voice normal, palate elevates symmetrically    XI: SCM/trap 5/5 bilat   XII: Tongue protrudes midline, no atrophy or fasciculations   *Motor:   Normal bulk.  No tremor, rigidity or bradykinesia. No pronator drift.    Strength: Dlt Bic Tri WrE WrF FgS Gr HF KnF KnE PlF DoF    Left '5 5 5 5 5 5 5 5 5 5 5 5    '$ Right '5 5 5 5 5 5 5 5 5 5 5 5    '$ *Sensory: Intact to light touch, pinprick, temperature vibration throughout. Symmetric. Propioception intact bilat.  No double-simultaneous extinction.  *Coordination:   Finger-to-nose, heel-to-shin, rapid alternating motions were intact. *Reflexes:  2+ and symmetric throughout without clonus; toes down-going bilat *Gait: deferred  NIHSS = 2 for visual fields   Premorbid mRS = 2   Labs   CBC:  Recent Labs  Lab 01/05/21 1624 01/06/21 0811  WBC  8.5 9.3  NEUTROABS 5.3  --   HGB 9.3* 10.0*  HCT 26.3* 27.8*  MCV 87.7 89.1  PLT 295 [id number]    Basic Metabolic Panel:  Lab Results  Component Value Date   NA 139 01/06/2021   K 3.0 (L) 01/06/2021   CO2 30 01/06/2021   GLUCOSE 183 (H) 01/06/2021   BUN 24 (H) 01/06/2021   CREATININE 2.07 (H) 01/06/2021   CALCIUM 8.0 (L) 01/06/2021   GFRNONAA 40 (L) 01/06/2021   Lipid Panel:  Lab Results  Component Value Date   LDLCALC 153 (H) 01/06/2021   HgbA1c:  Lab Results  Component Value Date   HGBA1C 10.7 (H) 06/11/2020   Urine Drug Screen:     Component Value Date/Time   LABOPIA NONE DETECTED 06/10/2020 1411   COCAINSCRNUR NONE DETECTED 06/10/2020 1411   LABBENZ NONE DETECTED 06/10/2020 1411   AMPHETMU NONE DETECTED 06/10/2020 1411   THCU NONE DETECTED 06/10/2020 1411   LABBARB NONE DETECTED 06/10/2020 1411    Alcohol Level No results found for: [place]    Impression   45 yo man with hx significant for DM2, HTN, RLE DVT no longer on [facility], CKD stage IIIb, bilateral retinal detachment who presented to ED yesterday after 2 episodes of transient vision loss c/f amaurosis fugax.   Recommendations   - TIA workup completed - ASA '81mg'$  daily + plavix '75mg'$  daily x21 days f/b ASA '81mg'$  daily after that. If decision is made to anticoagulate for chronic RLE DVT, antiplatelets should be discontinued. No neurologic indication for antiplatelets in setting of therapeutic anticoagulation.  - Atorvastatin '80mg'$  daily - I will arrange outpatient neurology f/u.  Neurology to sign off. Please re-engage if additional neurologic concerns arise.   ______________________________________________________________________   Thank you for the opportunity to take part in the care of this patient. If you have any further questions, please contact the neurology consultation attending.  Signed,  Su Monks, MD [facility] [phone]  If 7pm- 7am, please page neurology on call as listed in [place].

## 2021-01-06 NOTE — ED Notes (Signed)
Patient is noted to have light snoring with periods of apnea lasting 10-12 seconds at a time. Oxygen saturation remains at 100%. RN notified provider via secure chat.

## 2021-01-06 NOTE — ED Notes (Signed)
Sharion Settler, NP notified pt's BP was very high, see MAR for orders. Pt. Denies symptoms of high PB.

## 2021-01-06 NOTE — ED Notes (Signed)
Informed RN bed assigned 

## 2021-01-06 NOTE — Progress Notes (Signed)
PROGRESS NOTE    Bradley Lopez  [id number] DOB: 07-31-1975 DOA: 01/05/2021 PCP: Baxter Hire, MD   Brief Narrative: This 45 years old male with PMH significant for type 2 diabetes, hypertension, bilateral retinal detachment, right lower extremity DVT, CKD stage IIIb presented in the ED after sudden onset of bilateral acute vision loss that happened around 2:45 PM and lasted for 5 to 10 minutes. Patient was walking with his sister when all of this happened,  they walk to the car and they called 911.  He vomited once but his vision returned prior to EMS arrival.  Patient was diagnosed with bilateral Retinal detachment in February 2022 and follows with ophthalmology.  His next appointment is in February 06, 2021.  Patient reports his vision is at baseline in the ED.  Patient is admitted for TIA work-up.  Assessment & Plan:   Active Problems:   TIA (transient ischemic attack)   Acute visual loss consistent with amaurosis fugax: Patient reports complete loss of vision lasting 5 to 10 minutes. EMS was activated patient back to baseline before EMS arrived.  Patient admitted for stroke work-up.  Neurology was consulted. MRA head and neck: Normal. MRI brain : no acute abnormality. Echocardiogram completed report pending. LDL 153 hemoglobin A1c pending. Continue aspirin and Plavix and Lipitor. Neurology follow-up.   Essential hypertension: Permissive hypertension in the setting of TIA. Resume home blood pressure medications once MRI ruled out a stroke. IV hydralazine as needed. Monitor vital signs   Prolonged QTc interval: QTC on presentation more than 500. Avoid QTC prolonging medications. Repeat EKG in a.m. Monitor and correct electrolytes.   Bilateral retinal detachment: Follows with ophthalmology. Next appointment 02/06/2021.   History of right lower extremity DVT: Patient used to take Xarelto. Patient has not been taking anticoagulation due to the cost. RLE ultrasound  consistent with chronic DVT.   Hypokalemia: Replaced.  continue to monitor   CKD stage IIIb: Renal functions at baseline.   Avoid nephrotoxic medications.  Anemia of chronic disease. Hemoglobin is stable.  No obvious bleeding.   Type 2 diabetes. Last hemoglobin A1c 10.7 on 06/11/2020 Obtain hemoglobin A1c. Continue insulin sliding scale.    DVT prophylaxis: Lovenox Code Status: Full code. Family Communication:  Father at bed side. Disposition Plan:    Status is: Observation  The patient remains OBS appropriate and will d/c before 2 midnights.  Dispo:  Patient From: Home  Planned Disposition: Home  Medically stable for discharge: No      Consultants:  Neurology  Procedures: MRI brain, MRA head and neck.  Echocardiogram Antimicrobials:  Anti-infectives (From admission, onward)    None       Subjective: Patient was seen and examined at bedside.  Overnight events noted.   Patient reports feeling better,  vision is at his baseline. He denies any nausea, vomiting, headache, dizziness.  Objective: Vitals:   01/06/21 0625 01/06/21 0630 01/06/21 0635 01/06/21 0700  BP: (!) 158/94 (!) 170/94 (!) 164/100 (!) 182/109  Pulse: 86 87  85  Resp: '15 12 13 '$ (!) 9  Temp:      TempSrc:      SpO2: 100% 100%  100%  Weight:      Height:       No intake or output data in the 24 hours ending 01/06/21 1337 Filed Weights   01/05/21 1622  Weight: 90.3 kg    Examination:  General exam: Appears comfortable, not in any acute distress. Respiratory system: Clear to auscultation bilaterally, no  wheezing. Cardiovascular system: S1-S2 heard, regular rate and rhythm, no murmur.   Gastrointestinal system: Abdomen is soft, nontender, nondistended, BS+ Central nervous system: Alert and oriented X 3. No focal neurological deficits. Extremities: No edema, no cyanosis, no clubbing. Skin: No rashes, lesions or ulcers Psychiatry: Judgement and insight appear normal. Mood & affect  appropriate.     Data Reviewed: I have personally reviewed following labs and imaging studies  CBC: Recent Labs  Lab 01/05/21 1624 01/06/21 0811  WBC 8.5 9.3  NEUTROABS 5.3  --   HGB 9.3* 10.0*  HCT 26.3* 27.8*  MCV 87.7 89.1  PLT 295 [id number]   Basic Metabolic Panel: Recent Labs  Lab 01/05/21 1624 01/06/21 0811  NA 138 139  K 3.0* 3.0*  CL 101 104  CO2 29 30  GLUCOSE 177* 183*  BUN 22* 24*  CREATININE 2.05* 2.07*  CALCIUM 8.2* 8.0*  MG  --  1.8   GFR: Estimated Creatinine Clearance: 54.4 mL/min (A) (by C-G formula based on SCr of 2.07 mg/dL (H)). Liver Function Tests: Recent Labs  Lab 01/05/21 1624  AST 38  ALT 33  ALKPHOS 183*  BILITOT 0.8  PROT 5.5*  ALBUMIN 2.2*   No results for input(s): LIPASE, AMYLASE in the last 168 hours. No results for input(s): AMMONIA in the last 168 hours. Coagulation Profile: Recent Labs  Lab 01/05/21 1624  INR 1.0   Cardiac Enzymes: No results for input(s): CKTOTAL, CKMB, CKMBINDEX, TROPONINI in the last 168 hours. BNP (last 3 results) No results for input(s): PROBNP in the last 8760 hours. HbA1C: No results for input(s): HGBA1C in the last 72 hours. CBG: Recent Labs  Lab 01/05/21 2223 01/06/21 0814 01/06/21 1228  GLUCAP 138* 152* 141*   Lipid Profile: Recent Labs    01/05/21 1624 01/06/21 0811  CHOL  --  251*  HDL  --  62  LDLCALC  --  153*  TRIG  --  178*  CHOLHDL  --  4.0  LDLDIRECT 150.9*  --    Thyroid Function Tests: No results for input(s): TSH, T4TOTAL, FREET4, T3FREE, THYROIDAB in the last 72 hours. Anemia Panel: No results for input(s): VITAMINB12, FOLATE, FERRITIN, TIBC, IRON, RETICCTPCT in the last 72 hours. Sepsis Labs: No results for input(s): PROCALCITON, LATICACIDVEN in the last 168 hours.  Recent Results (from the past 240 hour(s))  Resp Panel by RT-PCR (Flu A&B, Covid) Nasopharyngeal Swab     Status: None   Collection Time: 01/05/21  7:09 PM   Specimen: Nasopharyngeal Swab;  Nasopharyngeal(NP) swabs in vial transport medium  Result Value Ref Range Status   SARS Coronavirus 2 by RT PCR NEGATIVE NEGATIVE Final    Comment: (NOTE) SARS-CoV-2 target nucleic acids are NOT DETECTED.  The SARS-CoV-2 RNA is generally detectable in upper respiratory specimens during the acute phase of infection. The lowest concentration of SARS-CoV-2 viral copies this assay can detect is 138 copies/mL. A negative result does not preclude SARS-Cov-2 infection and should not be used as the sole basis for treatment or other patient management decisions. A negative result may occur with  improper specimen collection/handling, submission of specimen other than nasopharyngeal swab, presence of viral mutation(s) within the areas targeted by this assay, and inadequate number of viral copies(<138 copies/mL). A negative result must be combined with clinical observations, patient history, and epidemiological information. The expected result is Negative.  Fact Sheet for Patients:  EntrepreneurPulse.com.au  Fact Sheet for Healthcare Providers:  IncredibleEmployment.be  This test is no t yet approved  or cleared by the [place] and  has been authorized for detection and/or diagnosis of SARS-CoV-2 by FDA under an Emergency Use Authorization (EUA). This EUA will remain  in effect (meaning this test can be used) for the duration of the COVID-19 declaration under Section 564(b)(1) of the Act, 21 U.S.C.section 360bbb-3(b)(1), unless the authorization is terminated  or revoked sooner.       Influenza A by PCR NEGATIVE NEGATIVE Final   Influenza B by PCR NEGATIVE NEGATIVE Final    Comment: (NOTE) The Xpert Xpress SARS-CoV-2/FLU/RSV plus assay is intended as an aid in the diagnosis of influenza from Nasopharyngeal swab specimens and should not be used as a sole basis for treatment. Nasal washings and aspirates are unacceptable for Xpert Xpress  SARS-CoV-2/FLU/RSV testing.  Fact Sheet for Patients: EntrepreneurPulse.com.au  Fact Sheet for Healthcare Providers: IncredibleEmployment.be  This test is not yet approved or cleared by the [place] FDA and has been authorized for detection and/or diagnosis of SARS-CoV-2 by FDA under an Emergency Use Authorization (EUA). This EUA will remain in effect (meaning this test can be used) for the duration of the COVID-19 declaration under Section 564(b)(1) of the Act, 21 U.S.C. section 360bbb-3(b)(1), unless the authorization is terminated or revoked.  Performed at [facility], [address], [place], [place] [address]     Radiology Studies: CT HEAD WO CONTRAST  Result Date: 01/05/2021 CLINICAL DATA:  Vision loss. EXAM: CT HEAD WITHOUT CONTRAST TECHNIQUE: Contiguous axial images were obtained from the base of the skull through the vertex without intravenous contrast. COMPARISON:  June 10, 2020. FINDINGS: Brain: No evidence of acute infarction, hemorrhage, hydrocephalus, extra-axial collection or mass lesion/mass effect. Vascular: No hyperdense vessel or unexpected calcification. Skull: Normal. Negative for fracture or focal lesion. Sinuses/Orbits: No acute finding. Other: None. IMPRESSION: No acute intracranial abnormality seen. Electronically Signed   By: Marijo Conception M.D.   On: 01/05/2021 17:55   MR ANGIO HEAD WO CONTRAST  Result Date: 01/05/2021 CLINICAL DATA:  Neurological deficit, acute, stroke suspected. Vision loss. EXAM: MRI HEAD WITHOUT CONTRAST MRA HEAD WITHOUT CONTRAST MRA NECK WITHOUT CONTRAST TECHNIQUE: Multiplanar, multiecho pulse sequences of the brain and surrounding structures were obtained without intravenous contrast. Angiographic images of the Circle of Willis were obtained using MRA technique without intravenous contrast. Angiographic images of the neck were obtained using MRA technique without intravenous contrast.  Carotid stenosis measurements (when applicable) are obtained utilizing NASCET criteria, using the distal internal carotid diameter as the denominator. COMPARISON:  Head CT earlier same day.  MRI 06/10/2020. FINDINGS: MRI HEAD FINDINGS Brain: Diffusion imaging does not show any acute or subacute infarction. No focal abnormality affects the brainstem or cerebellum. There is minimal, smudgy T2 and FLAIR signal within the white matter of the cerebral hemispheres, most notable in the forceps major regions. This could be developmental or could indicate an early manifestation of small vessel change. No cortical or large vessel territory infarction. No mass lesion, hemorrhage, hydrocephalus or extra-axial collection. Vascular: Major vessels at the base of the brain show flow. Venous sinuses appear patent. Skull and upper cervical spine: Normal. Sinuses/Orbits: Clear/normal. Other: None significant. MRA HEAD FINDINGS Both internal carotid arteries are widely patent into the brain. No siphon stenosis. The anterior and middle cerebral vessels are patent without proximal stenosis, aneurysm or vascular malformation. Both vertebral arteries are widely patent to the basilar. No basilar stenosis. Posterior circulation branch vessels appear normal. Fetal origin of both posterior cerebral arteries. MRA NECK FINDINGS  Both common carotid arteries are patent to the bifurcations. Both carotid bifurcations are normal. Antegrade flow in both vertebral arteries. IMPRESSION: Normal noncontrast MR angiography of the neck vessels and intracranial vessels. No acute or reversible brain finding. Minimal, smudgy T2 and FLAIR signal within the hemispheric white matter of the forceps major regions. This could be developmental or could relate to minimal small-vessel change. Electronically Signed   By: Nelson Chimes M.D.   On: 01/05/2021 20:49   MR ANGIO NECK WO CONTRAST  Result Date: 01/05/2021 CLINICAL DATA:  Neurological deficit, acute, stroke  suspected. Vision loss. EXAM: MRI HEAD WITHOUT CONTRAST MRA HEAD WITHOUT CONTRAST MRA NECK WITHOUT CONTRAST TECHNIQUE: Multiplanar, multiecho pulse sequences of the brain and surrounding structures were obtained without intravenous contrast. Angiographic images of the Circle of Willis were obtained using MRA technique without intravenous contrast. Angiographic images of the neck were obtained using MRA technique without intravenous contrast. Carotid stenosis measurements (when applicable) are obtained utilizing NASCET criteria, using the distal internal carotid diameter as the denominator. COMPARISON:  Head CT earlier same day.  MRI 06/10/2020. FINDINGS: MRI HEAD FINDINGS Brain: Diffusion imaging does not show any acute or subacute infarction. No focal abnormality affects the brainstem or cerebellum. There is minimal, smudgy T2 and FLAIR signal within the white matter of the cerebral hemispheres, most notable in the forceps major regions. This could be developmental or could indicate an early manifestation of small vessel change. No cortical or large vessel territory infarction. No mass lesion, hemorrhage, hydrocephalus or extra-axial collection. Vascular: Major vessels at the base of the brain show flow. Venous sinuses appear patent. Skull and upper cervical spine: Normal. Sinuses/Orbits: Clear/normal. Other: None significant. MRA HEAD FINDINGS Both internal carotid arteries are widely patent into the brain. No siphon stenosis. The anterior and middle cerebral vessels are patent without proximal stenosis, aneurysm or vascular malformation. Both vertebral arteries are widely patent to the basilar. No basilar stenosis. Posterior circulation branch vessels appear normal. Fetal origin of both posterior cerebral arteries. MRA NECK FINDINGS Both common carotid arteries are patent to the bifurcations. Both carotid bifurcations are normal. Antegrade flow in both vertebral arteries. IMPRESSION: Normal noncontrast MR  angiography of the neck vessels and intracranial vessels. No acute or reversible brain finding. Minimal, smudgy T2 and FLAIR signal within the hemispheric white matter of the forceps major regions. This could be developmental or could relate to minimal small-vessel change. Electronically Signed   By: Nelson Chimes M.D.   On: 01/05/2021 20:49   MR BRAIN WO CONTRAST  Result Date: 01/05/2021 CLINICAL DATA:  Neurological deficit, acute, stroke suspected. Vision loss. EXAM: MRI HEAD WITHOUT CONTRAST MRA HEAD WITHOUT CONTRAST MRA NECK WITHOUT CONTRAST TECHNIQUE: Multiplanar, multiecho pulse sequences of the brain and surrounding structures were obtained without intravenous contrast. Angiographic images of the Circle of Willis were obtained using MRA technique without intravenous contrast. Angiographic images of the neck were obtained using MRA technique without intravenous contrast. Carotid stenosis measurements (when applicable) are obtained utilizing NASCET criteria, using the distal internal carotid diameter as the denominator. COMPARISON:  Head CT earlier same day.  MRI 06/10/2020. FINDINGS: MRI HEAD FINDINGS Brain: Diffusion imaging does not show any acute or subacute infarction. No focal abnormality affects the brainstem or cerebellum. There is minimal, smudgy T2 and FLAIR signal within the white matter of the cerebral hemispheres, most notable in the forceps major regions. This could be developmental or could indicate an early manifestation of small vessel change. No cortical or large vessel territory infarction.  No mass lesion, hemorrhage, hydrocephalus or extra-axial collection. Vascular: Major vessels at the base of the brain show flow. Venous sinuses appear patent. Skull and upper cervical spine: Normal. Sinuses/Orbits: Clear/normal. Other: None significant. MRA HEAD FINDINGS Both internal carotid arteries are widely patent into the brain. No siphon stenosis. The anterior and middle cerebral vessels are  patent without proximal stenosis, aneurysm or vascular malformation. Both vertebral arteries are widely patent to the basilar. No basilar stenosis. Posterior circulation branch vessels appear normal. Fetal origin of both posterior cerebral arteries. MRA NECK FINDINGS Both common carotid arteries are patent to the bifurcations. Both carotid bifurcations are normal. Antegrade flow in both vertebral arteries. IMPRESSION: Normal noncontrast MR angiography of the neck vessels and intracranial vessels. No acute or reversible brain finding. Minimal, smudgy T2 and FLAIR signal within the hemispheric white matter of the forceps major regions. This could be developmental or could relate to minimal small-vessel change. Electronically Signed   By: Nelson Chimes M.D.   On: 01/05/2021 20:49   US Venous Img Lower Bilateral (DVT)  Result Date: 01/06/2021 CLINICAL DATA:  History of right lower extremity DVT with bilateral leg pain and swelling. EXAM: BILATERAL LOWER EXTREMITY VENOUS DOPPLER ULTRASOUND TECHNIQUE: Gray-scale sonography with graded compression, as well as color Doppler and duplex ultrasound were performed to evaluate the lower extremity deep venous systems from the level of the common femoral vein and including the common femoral, femoral, profunda femoral, popliteal and calf veins including the posterior tibial, peroneal and gastrocnemius veins when visible. The superficial great saphenous vein was also interrogated. Spectral Doppler was utilized to evaluate flow at rest and with distal augmentation maneuvers in the common femoral, femoral and popliteal veins. COMPARISON:  None. FINDINGS: RIGHT LOWER EXTREMITY Common Femoral Vein: No evidence of thrombus. Normal compressibility, respiratory phasicity and response to augmentation. Saphenofemoral Junction: No evidence of thrombus. Normal compressibility and flow on color Doppler imaging. Profunda Femoral Vein: No evidence of thrombus. Normal compressibility and flow  on color Doppler imaging. Femoral Vein: Evidence of nonocclusive thrombus within the mid to distal femoral vein with abnormal (partial) compressibility, respiratory phasicity and response to augmentation. Popliteal Vein: Evidence of nonocclusive thrombus with abnormal (partial) compressibility, respiratory phasicity and response to augmentation. Calf Veins: No evidence of thrombus. Normal compressibility and flow on color Doppler imaging. Superficial Great Saphenous Vein: No evidence of thrombus. Normal compressibility. Venous Reflux:  None. Other Findings:  None. LEFT LOWER EXTREMITY Common Femoral Vein: No evidence of thrombus. Normal compressibility, respiratory phasicity and response to augmentation. Saphenofemoral Junction: No evidence of thrombus. Normal compressibility and flow on color Doppler imaging. Profunda Femoral Vein: No evidence of thrombus. Normal compressibility and flow on color Doppler imaging. Femoral Vein: No evidence of thrombus. Normal compressibility, respiratory phasicity and response to augmentation. Popliteal Vein: No evidence of thrombus. Normal compressibility, respiratory phasicity and response to augmentation. Calf Veins: No evidence of thrombus. Normal compressibility and flow on color Doppler imaging. Superficial Great Saphenous Vein: No evidence of thrombus. Normal compressibility. Venous Reflux:  None. Other Findings:  None. IMPRESSION: 1. Nonocclusive DVT within the RIGHT femoral vein and RIGHT popliteal vein. While this appears to be chronic in nature, and associated acute component can not completely be excluded. 2. No evidence of DVT within the LEFT lower extremity. Electronically Signed   By: Virgina Norfolk M.D.   On: 01/06/2021 00:44    Scheduled Meds:  aspirin EC  81 mg Oral Daily   clopidogrel  75 mg Oral Daily   enoxaparin (LOVENOX)  injection  40 mg Subcutaneous Q24H   hydrALAZINE  50 mg Oral BID   insulin aspart  0-5 Units Subcutaneous QHS   insulin aspart   0-9 Units Subcutaneous TID WC   metoprolol tartrate  50 mg Oral BID   sodium chloride flush  3 mL Intravenous Once   Continuous Infusions:   LOS: 0 days    Time spent: 35 mins    Kristol Almanzar, MD [facility]   If 7PM-7AM, please contact night-coverage

## 2021-01-07 LAB — BASIC METABOLIC PANEL
Anion gap: 3 — ABNORMAL LOW (ref 5–15)
BUN: 22 mg/dL — ABNORMAL HIGH (ref 6–20)
CO2: 32 mmol/L (ref 22–32)
Calcium: 7.9 mg/dL — ABNORMAL LOW (ref 8.9–10.3)
Chloride: 103 mmol/L (ref 98–111)
Creatinine, Ser: 1.96 mg/dL — ABNORMAL HIGH (ref 0.61–1.24)
GFR, Estimated: 42 mL/min — ABNORMAL LOW (ref >60)
Glucose, Bld: 187 mg/dL — ABNORMAL HIGH (ref 70–99)
Potassium: 2.9 mmol/L — ABNORMAL LOW (ref 3.5–5.1)
Sodium: 138 mmol/L (ref 135–145)

## 2021-01-07 LAB — CBC
HCT: 27.6 % — ABNORMAL LOW (ref 39.0–52.0)
Hemoglobin: 9.2 g/dL — ABNORMAL LOW (ref 13.0–17.0)
MCH: 29.6 pg (ref 26.0–34.0)
MCHC: 33.3 g/dL (ref 30.0–36.0)
MCV: 88.7 fL (ref 80.0–100.0)
Platelets: 295 10*3/uL (ref 150–400)
RBC: 3.11 MIL/uL — ABNORMAL LOW (ref 4.22–5.81)
RDW: 13.5 % (ref 11.5–15.5)
WBC: 9.3 10*3/uL (ref 4.0–10.5)
nRBC: 0 % (ref 0.0–0.2)

## 2021-01-07 LAB — GLUCOSE, CAPILLARY: Glucose-Capillary: 199 mg/dL — ABNORMAL HIGH (ref 70–99)

## 2021-01-07 MED ORDER — POTASSIUM CHLORIDE 20 MEQ PO PACK: 40.0000 meq | PACK | Freq: Once | ORAL | Status: DC

## 2021-01-07 MED ORDER — ATORVASTATIN CALCIUM 80 MG PO TABS: 80.0000 mg | ORAL_TABLET | Freq: Every day | ORAL | 1 refills | Status: DC

## 2021-01-07 MED ORDER — POTASSIUM CHLORIDE 20 MEQ PO PACK: 40.0000 meq | PACK | Freq: Once | ORAL | 0 refills | Status: DC

## 2021-01-07 MED ORDER — CLOPIDOGREL BISULFATE 75 MG PO TABS: 75.0000 mg | ORAL_TABLET | Freq: Every day | ORAL | 0 refills | Status: DC

## 2021-01-07 MED ORDER — ASPIRIN 81 MG PO TBEC: 81.0000 mg | DELAYED_RELEASE_TABLET | Freq: Every day | ORAL | 11 refills | Status: DC

## 2021-01-07 MED ORDER — HYDRALAZINE HCL 50 MG PO TABS: 50.0000 mg | ORAL_TABLET | Freq: Three times a day (TID) | ORAL | Status: DC

## 2021-01-07 MED ORDER — HYDRALAZINE HCL 50 MG PO TABS: 50.0000 mg | ORAL_TABLET | Freq: Three times a day (TID) | ORAL | 1 refills | Status: DC

## 2021-01-07 MED ORDER — AMLODIPINE BESYLATE 10 MG PO TABS: 10.0000 mg | ORAL_TABLET | Freq: Every day | ORAL | 1 refills | Status: DC

## 2021-01-07 MED ORDER — POTASSIUM CHLORIDE 20 MEQ PO PACK
40.0000 meq | PACK | Freq: Once | ORAL | Status: AC
  Administered 2021-01-07: 08:00:00 40 meq via ORAL
  Filled 2021-01-07: qty 2

## 2021-01-07 NOTE — Discharge Instructions (Signed)
Advised to follow-up with neurology as scheduled. Advised to take aspirin and Plavix daily for 21 days followed by aspirin only therapy. Advised to continue blood pressure medications, amlodipine has been restarted. Patient does not want to take Coumadin and could not afford Xarelto or Eliquis for chronic DVT.

## 2021-01-07 NOTE — Discharge Summary (Signed)
Physician Discharge Summary  Chaden Battis [id number] DOB: 1976/02/03 DOA: 01/05/2021  PCP: Baxter Hire, MD  Admit date: 01/05/2021  Discharge date: 01/07/2021  Admitted From: Home.  Disposition:  Home.  Recommendations for Outpatient Follow-up:  Follow up with PCP in 1-2 weeks. Please obtain BMP/CBC in one week. Advised to follow-up with neurology as scheduled. Advised to take aspirin and Plavix daily for 21 days followed by aspirin only therapy. Advised to continue blood pressure medications, amlodipine has been restarted. Patient does not want to take Coumadin and could not afford Xarelto or Eliquis for chronic DVT.  Home Health: None Equipment/Devices:None  Discharge Condition: Stable CODE STATUS:Full code Diet recommendation: Heart Healthy  Brief Summary / Hospital Course: This 45 years old male with PMH significant for type 2 diabetes, hypertension, bilateral retinal detachment, Chronic right lower extremity DVT, CKD stage IIIb presented in the ED after experiencing sudden onset of bilateral acute vision loss that happened around 2:45 PM and lasted for 5 to 10 minutes. Patient was walking with his sister when all of this happened,  they walk to the car and they called 911.  He vomited once then his vision returned prior to EMS arrival.  Patient was diagnosed with bilateral Retinal detachment in February 2022 and follows with ophthalmology.  His next appointment is in February 06, 2021.  Patient reports his vision is at baseline in the ED.   Patient was admitted for TIA work-up.  MRA head and neck was normal, MRI brain showed no acute abnormality.  Echocardiogram completed which was unremarkable.  LDL 153.  Patient was evaluated by neurologist , recommended dual antiplatelet therapy for 21 days followed by aspirin only therapy.  Regarding the history of chronic right lower extremity DVT,  Patient is not on any anticoagulation.  Patient stated he could not afford Xarelto or Eliquis  since he has no insurance.  He does not want to take Coumadin,  states he cannot drive and his wife works so it would be difficult for him to manage with Coumadin.  He understands the risks of not being on anticoagulation.  Patient's father who is a retired Engineer, drilling also understands the risks.  Patient feels better and want to be discharged.  Patient is being discharged home.  He was managed for below problems.  Discharge Diagnoses:  Active Problems:   TIA (transient ischemic attack)  Acute visual loss consistent with amaurosis fugax: Patient reports complete loss of vision lasting 5 to 10 minutes. EMS was activated patient back to baseline before EMS arrived.  Patient admitted for TIA/ stroke work-up.  Neurology was consulted. MRA head and neck: Normal. MRI brain : no acute abnormality. Echocardiogram completed  LVEF 55-60% LDL 153 hemoglobin A1c pending. Continue aspirin and Plavix and Lipitor. Neurology recommended dual antiplatelet therapy for 21 days followed by aspirin only monotherapy and outpatient follow-up.   Essential hypertension: Permissive hypertension in the setting of TIA. Resume home blood pressure medications once MRI ruled out a stroke. IV hydralazine as needed. Monitor vital signs   Prolonged QTc interval: Avoid QTC prolonging medications. Repeat EKG  improved QTC. Monitor and correct electrolytes.   Bilateral retinal detachment: Follows with ophthalmology. Next appointment 02/06/2021.   History of right lower extremity DVT: Patient used to take Xarelto. Patient has not been taking anticoagulation due to the cost. RLE ultrasound consistent with chronic DVT.   Hypokalemia: Replaced.     CKD stage IIIb: Renal functions at baseline.   Avoid nephrotoxic medications.   Anemia of  chronic disease. Hemoglobin is stable.  No obvious bleeding.   Type 2 diabetes. Last hemoglobin A1c 10.7 on 06/11/2020 Continue insulin sliding scale.    Discharge  Instructions  Discharge Instructions     Ambulatory referral to Neurology   Complete by: As directed    An appointment is requested in approximately: 6 wks   Call MD for:  difficulty breathing, headache or visual disturbances   Complete by: As directed    Call MD for:  persistant dizziness or light-headedness   Complete by: As directed    Call MD for:  persistant nausea and vomiting   Complete by: As directed    Diet - low sodium heart healthy   Complete by: As directed    Diet Carb Modified   Complete by: As directed    Discharge instructions   Complete by: As directed    Advised to follow-up with primary care physician in 1 week. Advised to follow-up with neurology as scheduled. Advised to take aspirin and Plavix daily for 21 days followed by aspirin only therapy. Advised to continue blood pressure medications, amlodipine has been restarted. Patient does not want to take Coumadin and could not afford Xarelto or Eliquis for chronic DVT.   Increase activity slowly   Complete by: As directed       Allergies as of 01/07/2021       Reactions   Pine Hives, Itching        Medication List     STOP taking these medications    apixaban 5 MG Tabs tablet Commonly known as: ELIQUIS   cloNIDine 0.2 mg/24hr patch Commonly known as: CATAPRES - Dosed in mg/24 hr   lisinopril 20 MG tablet Commonly known as: ZESTRIL   ondansetron 4 MG tablet Commonly known as: ZOFRAN       TAKE these medications    amLODipine 10 MG tablet Commonly known as: NORVASC Take 1 tablet (10 mg total) by mouth daily.   aspirin 81 MG EC tablet Take 1 tablet (81 mg total) by mouth daily. Swallow whole. Start taking on: January 08, 2021   atorvastatin 80 MG tablet Commonly known as: LIPITOR Take 1 tablet (80 mg total) by mouth daily. Start taking on: January 08, 2021 What changed:  medication strength how much to take when to take this   cloNIDine 0.1 MG tablet Commonly known as:  CATAPRES Take 0.1 mg by mouth 2 (two) times daily.   clopidogrel 75 MG tablet Commonly known as: PLAVIX Take 1 tablet (75 mg total) by mouth daily. Start taking on: January 08, 2021   furosemide 40 MG tablet Commonly known as: LASIX Take 1 tablet (40 mg total) by mouth 2 (two) times daily.   glipiZIDE 5 MG 24 hr tablet Commonly known as: GLUCOTROL XL Take 5 mg by mouth daily.   hydrALAZINE 50 MG tablet Commonly known as: APRESOLINE Take 1 tablet (50 mg total) by mouth every 8 (eight) hours. What changed: when to take this   metoCLOPramide 5 MG tablet Commonly known as: REGLAN Take 1 tablet (5 mg total) by mouth every 8 (eight) hours as needed for up to 7 days for nausea.   metoprolol tartrate 50 MG tablet Commonly known as: LOPRESSOR Take 1 tablet (50 mg total) by mouth 2 (two) times daily.   potassium chloride 20 MEQ packet Commonly known as: KLOR-CON Take 40 mEq by mouth once for 1 dose.   temazepam 15 MG capsule Commonly known as: RESTORIL Take 15 mg by  mouth at bedtime as needed for sleep.        Follow-up Information     Baxter Hire, MD Follow up in 1 week(s).   Specialty: Internal Medicine Contact information: [place] [address] [phone]         [facility] Follow up in 4 week(s).   Contact information: [place] [phone]               Allergies  Allergen Reactions   Pine Hives and Itching    Consultations: Neurology   Procedures/Studies: CT HEAD WO CONTRAST  Result Date: 01/05/2021 CLINICAL DATA:  Vision loss. EXAM: CT HEAD WITHOUT CONTRAST TECHNIQUE: Contiguous axial images were obtained from the base of the skull through the vertex without intravenous contrast. COMPARISON:  June 10, 2020. FINDINGS: Brain: No evidence of acute infarction, hemorrhage, hydrocephalus, extra-axial collection or mass lesion/mass effect.  Vascular: No hyperdense vessel or unexpected calcification. Skull: Normal. Negative for fracture or focal lesion. Sinuses/Orbits: No acute finding. Other: None. IMPRESSION: No acute intracranial abnormality seen. Electronically Signed   By: Marijo Conception M.D.   On: 01/05/2021 17:55   MR ANGIO HEAD WO CONTRAST  Result Date: 01/05/2021 CLINICAL DATA:  Neurological deficit, acute, stroke suspected. Vision loss. EXAM: MRI HEAD WITHOUT CONTRAST MRA HEAD WITHOUT CONTRAST MRA NECK WITHOUT CONTRAST TECHNIQUE: Multiplanar, multiecho pulse sequences of the brain and surrounding structures were obtained without intravenous contrast. Angiographic images of the Circle of Willis were obtained using MRA technique without intravenous contrast. Angiographic images of the neck were obtained using MRA technique without intravenous contrast. Carotid stenosis measurements (when applicable) are obtained utilizing NASCET criteria, using the distal internal carotid diameter as the denominator. COMPARISON:  Head CT earlier same day.  MRI 06/10/2020. FINDINGS: MRI HEAD FINDINGS Brain: Diffusion imaging does not show any acute or subacute infarction. No focal abnormality affects the brainstem or cerebellum. There is minimal, smudgy T2 and FLAIR signal within the white matter of the cerebral hemispheres, most notable in the forceps major regions. This could be developmental or could indicate an early manifestation of small vessel change. No cortical or large vessel territory infarction. No mass lesion, hemorrhage, hydrocephalus or extra-axial collection. Vascular: Major vessels at the base of the brain show flow. Venous sinuses appear patent. Skull and upper cervical spine: Normal. Sinuses/Orbits: Clear/normal. Other: None significant. MRA HEAD FINDINGS Both internal carotid arteries are widely patent into the brain. No siphon stenosis. The anterior and middle cerebral vessels are patent without proximal stenosis, aneurysm or vascular  malformation. Both vertebral arteries are widely patent to the basilar. No basilar stenosis. Posterior circulation branch vessels appear normal. Fetal origin of both posterior cerebral arteries. MRA NECK FINDINGS Both common carotid arteries are patent to the bifurcations. Both carotid bifurcations are normal. Antegrade flow in both vertebral arteries. IMPRESSION: Normal noncontrast MR angiography of the neck vessels and intracranial vessels. No acute or reversible brain finding. Minimal, smudgy T2 and FLAIR signal within the hemispheric white matter of the forceps major regions. This could be developmental or could relate to minimal small-vessel change. Electronically Signed   By: Nelson Chimes M.D.   On: 01/05/2021 20:49   MR ANGIO NECK WO CONTRAST  Result Date: 01/05/2021 CLINICAL DATA:  Neurological deficit, acute, stroke suspected. Vision loss. EXAM: MRI HEAD WITHOUT CONTRAST MRA HEAD WITHOUT CONTRAST MRA NECK WITHOUT CONTRAST TECHNIQUE: Multiplanar, multiecho pulse sequences of the brain and surrounding structures were obtained  without intravenous contrast. Angiographic images of the Circle of Willis were obtained using MRA technique without intravenous contrast. Angiographic images of the neck were obtained using MRA technique without intravenous contrast. Carotid stenosis measurements (when applicable) are obtained utilizing NASCET criteria, using the distal internal carotid diameter as the denominator. COMPARISON:  Head CT earlier same day.  MRI 06/10/2020. FINDINGS: MRI HEAD FINDINGS Brain: Diffusion imaging does not show any acute or subacute infarction. No focal abnormality affects the brainstem or cerebellum. There is minimal, smudgy T2 and FLAIR signal within the white matter of the cerebral hemispheres, most notable in the forceps major regions. This could be developmental or could indicate an early manifestation of small vessel change. No cortical or large vessel territory infarction. No mass  lesion, hemorrhage, hydrocephalus or extra-axial collection. Vascular: Major vessels at the base of the brain show flow. Venous sinuses appear patent. Skull and upper cervical spine: Normal. Sinuses/Orbits: Clear/normal. Other: None significant. MRA HEAD FINDINGS Both internal carotid arteries are widely patent into the brain. No siphon stenosis. The anterior and middle cerebral vessels are patent without proximal stenosis, aneurysm or vascular malformation. Both vertebral arteries are widely patent to the basilar. No basilar stenosis. Posterior circulation branch vessels appear normal. Fetal origin of both posterior cerebral arteries. MRA NECK FINDINGS Both common carotid arteries are patent to the bifurcations. Both carotid bifurcations are normal. Antegrade flow in both vertebral arteries. IMPRESSION: Normal noncontrast MR angiography of the neck vessels and intracranial vessels. No acute or reversible brain finding. Minimal, smudgy T2 and FLAIR signal within the hemispheric white matter of the forceps major regions. This could be developmental or could relate to minimal small-vessel change. Electronically Signed   By: Nelson Chimes M.D.   On: 01/05/2021 20:49   MR BRAIN WO CONTRAST  Result Date: 01/05/2021 CLINICAL DATA:  Neurological deficit, acute, stroke suspected. Vision loss. EXAM: MRI HEAD WITHOUT CONTRAST MRA HEAD WITHOUT CONTRAST MRA NECK WITHOUT CONTRAST TECHNIQUE: Multiplanar, multiecho pulse sequences of the brain and surrounding structures were obtained without intravenous contrast. Angiographic images of the Circle of Willis were obtained using MRA technique without intravenous contrast. Angiographic images of the neck were obtained using MRA technique without intravenous contrast. Carotid stenosis measurements (when applicable) are obtained utilizing NASCET criteria, using the distal internal carotid diameter as the denominator. COMPARISON:  Head CT earlier same day.  MRI 06/10/2020. FINDINGS:  MRI HEAD FINDINGS Brain: Diffusion imaging does not show any acute or subacute infarction. No focal abnormality affects the brainstem or cerebellum. There is minimal, smudgy T2 and FLAIR signal within the white matter of the cerebral hemispheres, most notable in the forceps major regions. This could be developmental or could indicate an early manifestation of small vessel change. No cortical or large vessel territory infarction. No mass lesion, hemorrhage, hydrocephalus or extra-axial collection. Vascular: Major vessels at the base of the brain show flow. Venous sinuses appear patent. Skull and upper cervical spine: Normal. Sinuses/Orbits: Clear/normal. Other: None significant. MRA HEAD FINDINGS Both internal carotid arteries are widely patent into the brain. No siphon stenosis. The anterior and middle cerebral vessels are patent without proximal stenosis, aneurysm or vascular malformation. Both vertebral arteries are widely patent to the basilar. No basilar stenosis. Posterior circulation branch vessels appear normal. Fetal origin of both posterior cerebral arteries. MRA NECK FINDINGS Both common carotid arteries are patent to the bifurcations. Both carotid bifurcations are normal. Antegrade flow in both vertebral arteries. IMPRESSION: Normal noncontrast MR angiography of the neck vessels and intracranial vessels. No  acute or reversible brain finding. Minimal, smudgy T2 and FLAIR signal within the hemispheric white matter of the forceps major regions. This could be developmental or could relate to minimal small-vessel change. Electronically Signed   By: Nelson Chimes M.D.   On: 01/05/2021 20:49   US Venous Img Lower Bilateral (DVT)  Result Date: 01/06/2021 CLINICAL DATA:  History of right lower extremity DVT with bilateral leg pain and swelling. EXAM: BILATERAL LOWER EXTREMITY VENOUS DOPPLER ULTRASOUND TECHNIQUE: Gray-scale sonography with graded compression, as well as color Doppler and duplex ultrasound were  performed to evaluate the lower extremity deep venous systems from the level of the common femoral vein and including the common femoral, femoral, profunda femoral, popliteal and calf veins including the posterior tibial, peroneal and gastrocnemius veins when visible. The superficial great saphenous vein was also interrogated. Spectral Doppler was utilized to evaluate flow at rest and with distal augmentation maneuvers in the common femoral, femoral and popliteal veins. COMPARISON:  None. FINDINGS: RIGHT LOWER EXTREMITY Common Femoral Vein: No evidence of thrombus. Normal compressibility, respiratory phasicity and response to augmentation. Saphenofemoral Junction: No evidence of thrombus. Normal compressibility and flow on color Doppler imaging. Profunda Femoral Vein: No evidence of thrombus. Normal compressibility and flow on color Doppler imaging. Femoral Vein: Evidence of nonocclusive thrombus within the mid to distal femoral vein with abnormal (partial) compressibility, respiratory phasicity and response to augmentation. Popliteal Vein: Evidence of nonocclusive thrombus with abnormal (partial) compressibility, respiratory phasicity and response to augmentation. Calf Veins: No evidence of thrombus. Normal compressibility and flow on color Doppler imaging. Superficial Great Saphenous Vein: No evidence of thrombus. Normal compressibility. Venous Reflux:  None. Other Findings:  None. LEFT LOWER EXTREMITY Common Femoral Vein: No evidence of thrombus. Normal compressibility, respiratory phasicity and response to augmentation. Saphenofemoral Junction: No evidence of thrombus. Normal compressibility and flow on color Doppler imaging. Profunda Femoral Vein: No evidence of thrombus. Normal compressibility and flow on color Doppler imaging. Femoral Vein: No evidence of thrombus. Normal compressibility, respiratory phasicity and response to augmentation. Popliteal Vein: No evidence of thrombus. Normal compressibility,  respiratory phasicity and response to augmentation. Calf Veins: No evidence of thrombus. Normal compressibility and flow on color Doppler imaging. Superficial Great Saphenous Vein: No evidence of thrombus. Normal compressibility. Venous Reflux:  None. Other Findings:  None. IMPRESSION: 1. Nonocclusive DVT within the RIGHT femoral vein and RIGHT popliteal vein. While this appears to be chronic in nature, and associated acute component can not completely be excluded. 2. No evidence of DVT within the LEFT lower extremity. Electronically Signed   By: Virgina Norfolk M.D.   On: 01/06/2021 00:44   ECHOCARDIOGRAM COMPLETE BUBBLE STUDY  Result Date: 01/06/2021    ECHOCARDIOGRAM REPORT   Patient Name:   ZAINE ZUHLKE Date of Exam: 01/06/2021 Medical Rec #:  [id number]   Height:       75.0 in Accession #:    [id number]  Weight:       199.0 lb Date of Birth:  Sep 03, 1975   BSA:          2.190 m Patient Age:    12 years    BP:           182/109 mmHg Patient Gender: M           HR:           85 bpm. Exam Location:  [facility] Procedure: 2D Echo, Cardiac Doppler, Color Doppler and Saline Contrast Bubble  Study Indications:     TIA 435.9 / G45.9  History:         Patient has prior history of Echocardiogram examinations, most                  recent 06/11/2020. Risk Factors:Hypertension and Diabetes.  Sonographer:     Sherrie Sport Referring Phys:  [place] Diagnosing Phys: Ida Rogue MD IMPRESSIONS  1. Left ventricular ejection fraction, by estimation, is 60 to 65%. The left ventricle has normal function. The left ventricle has no regional wall motion abnormalities. There is moderate left ventricular hypertrophy. Left ventricular diastolic parameters are consistent with Grade I diastolic dysfunction (impaired relaxation).  2. Right ventricular systolic function is normal. The right ventricular size is normal.  3. Left atrial size was moderately dilated.  4. The mitral valve is normal in structure. Mild mitral  valve regurgitation.  5. A small pericardial effusion is present.  6. Agitated saline contrast bubble study was negative, with no evidence of any interatrial shunt. FINDINGS  Left Ventricle: Left ventricular ejection fraction, by estimation, is 60 to 65%. The left ventricle has normal function. The left ventricle has no regional wall motion abnormalities. The left ventricular internal cavity size was normal in size. There is  moderate left ventricular hypertrophy. Left ventricular diastolic parameters are consistent with Grade I diastolic dysfunction (impaired relaxation). Right Ventricle: The right ventricular size is normal. No increase in right ventricular wall thickness. Right ventricular systolic function is normal. Left Atrium: Left atrial size was moderately dilated. Right Atrium: Right atrial size was normal in size. Pericardium: A small pericardial effusion is present. Mitral Valve: The mitral valve is normal in structure. Mild mitral valve regurgitation. No evidence of mitral valve stenosis. Tricuspid Valve: The tricuspid valve is normal in structure. Tricuspid valve regurgitation is trivial. No evidence of tricuspid stenosis. Aortic Valve: The aortic valve was not well visualized. Aortic valve regurgitation is not visualized. No aortic stenosis is present. Aortic valve mean gradient measures 3.7 mmHg. Aortic valve peak gradient measures 6.5 mmHg. Aortic valve area, by VTI measures 2.19 cm. Pulmonic Valve: The pulmonic valve was normal in structure. Pulmonic valve regurgitation is not visualized. No evidence of pulmonic stenosis. Aorta: The aortic root is normal in size and structure. Venous: The inferior vena cava is normal in size with greater than 50% respiratory variability, suggesting right atrial pressure of 3 mmHg. IAS/Shunts: No atrial level shunt detected by color flow Doppler. Agitated saline contrast was given intravenously to evaluate for intracardiac shunting. Agitated saline contrast bubble  study was negative, with no evidence of any interatrial shunt.  LEFT VENTRICLE PLAX 2D LVIDd:         4.60 cm  Diastology LVIDs:         3.30 cm  LV e' medial:    5.66 cm/s LV PW:         1.60 cm  LV E/e' medial:  14.2 LV IVS:        1.00 cm  LV e' lateral:   5.98 cm/s LVOT diam:     2.10 cm  LV E/e' lateral: 13.5 LV SV:         51 LV SV Index:   23 LVOT Area:     3.46 cm  RIGHT VENTRICLE RV Basal diam:  2.50 cm RV S prime:     12.60 cm/s TAPSE (M-mode): 4.0 cm LEFT ATRIUM             Index  RIGHT ATRIUM           Index LA diam:        3.50 cm 1.60 cm/m  RA Area:     18.40 cm LA Vol (A2C):   86.1 ml 39.32 ml/m RA Volume:   55.10 ml  25.16 ml/m LA Vol (A4C):   73.6 ml 33.61 ml/m LA Biplane Vol: 86.1 ml 39.32 ml/m  AORTIC VALVE                   PULMONIC VALVE AV Area (Vmax):    1.92 cm    PV Vmax:        0.96 m/s AV Area (Vmean):   1.88 cm    PV Peak grad:   3.6 mmHg AV Area (VTI):     2.19 cm    RVOT Peak grad: 5 mmHg AV Vmax:           127.33 cm/s AV Vmean:          87.300 cm/s AV VTI:            0.233 m AV Peak Grad:      6.5 mmHg AV Mean Grad:      3.7 mmHg LVOT Vmax:         70.50 cm/s LVOT Vmean:        47.500 cm/s LVOT VTI:          0.147 m LVOT/AV VTI ratio: 0.63  AORTA Ao Root diam: 3.20 cm MITRAL VALVE                TRICUSPID VALVE MV Area (PHT): 3.53 cm     TR Peak grad:   11.7 mmHg MV Decel Time: 215 msec     TR Vmax:        171.00 cm/s MV E velocity: 80.60 cm/s MV A velocity: 104.00 cm/s  SHUNTS MV E/A ratio:  0.78         Systemic VTI:  0.15 m                             Systemic Diam: 2.10 cm Ida Rogue MD Electronically signed by Ida Rogue MD Signature Date/Time: 01/06/2021/1:54:06 PM    Final     MRI, echocardiogram, CT head.   Subjective: Patient was seen and examined at bedside.  Overnight events noted.   Patient reports feeling better,  he reports he is back to his baseline.   Blood pressure slightly elevated, reports blood pressure remains elevated at home,   medications were adjusted.   Patient is being discharged home.  Discharge Exam: Vitals:   01/07/21 0859 01/07/21 1013  BP: (!) 174/99 (!) 166/95  Pulse:    Resp:    Temp:    SpO2:     Vitals:   01/07/21 0656 01/07/21 0800 01/07/21 0859 01/07/21 1013  BP: (!) 177/95 (!) 165/97 (!) 174/99 (!) 166/95  Pulse: 77 80    Resp:  18    Temp:  97.9 F (36.6 C)    TempSrc:  Oral    SpO2:  100%    Weight:      Height:        General: Awake, comfortable, not in any acute distress. Cardiovascular: RRR, S1/S2 +, no rubs, no gallops Respiratory: CTA bilaterally, no wheezing, no rhonchi Abdominal: Soft, NT, ND, bowel sounds + Extremities: No edema, no cyanosis.    The results of significant diagnostics from  this hospitalization (including imaging, microbiology, ancillary and laboratory) are listed below for reference.     Microbiology: Recent Results (from the past 240 hour(s))  Resp Panel by RT-PCR (Flu A&B, Covid) Nasopharyngeal Swab     Status: None   Collection Time: 01/05/21  7:09 PM   Specimen: Nasopharyngeal Swab; Nasopharyngeal(NP) swabs in vial transport medium  Result Value Ref Range Status   SARS Coronavirus 2 by RT PCR NEGATIVE NEGATIVE Final    Comment: (NOTE) SARS-CoV-2 target nucleic acids are NOT DETECTED.  The SARS-CoV-2 RNA is generally detectable in upper respiratory specimens during the acute phase of infection. The lowest concentration of SARS-CoV-2 viral copies this assay can detect is 138 copies/mL. A negative result does not preclude SARS-Cov-2 infection and should not be used as the sole basis for treatment or other patient management decisions. A negative result may occur with  improper specimen collection/handling, submission of specimen other than nasopharyngeal swab, presence of viral mutation(s) within the areas targeted by this assay, and inadequate number of viral copies(<138 copies/mL). A negative result must be combined with clinical  observations, patient history, and epidemiological information. The expected result is Negative.  Fact Sheet for Patients:  EntrepreneurPulse.com.au  Fact Sheet for Healthcare Providers:  IncredibleEmployment.be  This test is no t yet approved or cleared by the [place] FDA and  has been authorized for detection and/or diagnosis of SARS-CoV-2 by FDA under an Emergency Use Authorization (EUA). This EUA will remain  in effect (meaning this test can be used) for the duration of the COVID-19 declaration under Section 564(b)(1) of the Act, 21 U.S.C.section 360bbb-3(b)(1), unless the authorization is terminated  or revoked sooner.       Influenza A by PCR NEGATIVE NEGATIVE Final   Influenza B by PCR NEGATIVE NEGATIVE Final    Comment: (NOTE) The Xpert Xpress SARS-CoV-2/FLU/RSV plus assay is intended as an aid in the diagnosis of influenza from Nasopharyngeal swab specimens and should not be used as a sole basis for treatment. Nasal washings and aspirates are unacceptable for Xpert Xpress SARS-CoV-2/FLU/RSV testing.  Fact Sheet for Patients: EntrepreneurPulse.com.au  Fact Sheet for Healthcare Providers: IncredibleEmployment.be  This test is not yet approved or cleared by the [place] FDA and has been authorized for detection and/or diagnosis of SARS-CoV-2 by FDA under an Emergency Use Authorization (EUA). This EUA will remain in effect (meaning this test can be used) for the duration of the COVID-19 declaration under Section 564(b)(1) of the Act, 21 U.S.C. section 360bbb-3(b)(1), unless the authorization is terminated or revoked.  Performed at [facility], [address] [place], [place] [address]   MRSA Next Gen by PCR, Nasal     Status: None   Collection Time: 01/06/21  9:07 PM   Specimen: Nasal Mucosa; Nasal Swab  Result Value Ref Range Status   MRSA by PCR Next Gen NOT DETECTED  NOT DETECTED Final    Comment: (NOTE) The GeneXpert MRSA Assay (FDA approved for NASAL specimens only), is one component of a comprehensive MRSA colonization surveillance program. It is not intended to diagnose MRSA infection nor to guide or monitor treatment for MRSA infections. Test performance is not FDA approved in patients less than 4 years old. Performed at [facility], [address] [place], [place] [address]      Labs: BNP (last 3 results) Recent Labs    06/10/20 1029  BNP [id number]*   Basic Metabolic Panel: Recent Labs  Lab 01/05/21 1624 01/06/21 0811 01/07/21 0541  NA 138 139 138  K 3.0* 3.0* 2.9*  CL 101 104 103  CO2 29 30 32  GLUCOSE 177* 183* 187*  BUN 22* 24* 22*  CREATININE 2.05* 2.07* 1.96*  CALCIUM 8.2* 8.0* 7.9*  MG  --  1.8  --    Liver Function Tests: Recent Labs  Lab 01/05/21 1624  AST 38  ALT 33  ALKPHOS 183*  BILITOT 0.8  PROT 5.5*  ALBUMIN 2.2*   No results for input(s): LIPASE, AMYLASE in the last 168 hours. No results for input(s): AMMONIA in the last 168 hours. CBC: Recent Labs  Lab 01/05/21 1624 01/06/21 0811 01/07/21 0541  WBC 8.5 9.3 9.3  NEUTROABS 5.3  --   --   HGB 9.3* 10.0* 9.2*  HCT 26.3* 27.8* 27.6*  MCV 87.7 89.1 88.7  PLT 295 279 295   Cardiac Enzymes: No results for input(s): CKTOTAL, CKMB, CKMBINDEX, TROPONINI in the last 168 hours. BNP: Invalid input(s): POCBNP CBG: Recent Labs  Lab 01/06/21 0814 01/06/21 1228 01/06/21 1709 01/06/21 2056 01/07/21 0907  GLUCAP 152* 141* 132* 182* 199*   D-Dimer No results for input(s): DDIMER in the last 72 hours. Hgb A1c No results for input(s): HGBA1C in the last 72 hours. Lipid Profile Recent Labs    01/05/21 1624 01/06/21 0811  CHOL  --  251*  HDL  --  62  LDLCALC  --  153*  TRIG  --  178*  CHOLHDL  --  4.0  LDLDIRECT 150.9*  --    Thyroid function studies No results for input(s): TSH, T4TOTAL, T3FREE, THYROIDAB in the last 72  hours.  Invalid input(s): FREET3 Anemia work up No results for input(s): VITAMINB12, FOLATE, FERRITIN, TIBC, IRON, RETICCTPCT in the last 72 hours. Urinalysis    Component Value Date/Time   COLORURINE YELLOW (A) 06/10/2020 1411   APPEARANCEUR CLOUDY (A) 06/10/2020 1411   LABSPEC 1.010 06/10/2020 1411   PHURINE 7.0 06/10/2020 1411   GLUCOSEU 50 (A) 06/10/2020 1411   HGBUR MODERATE (A) 06/10/2020 1411   BILIRUBINUR NEGATIVE 06/10/2020 1411   KETONESUR NEGATIVE 06/10/2020 1411   PROTEINUR >=300 (A) 06/10/2020 1411   NITRITE NEGATIVE 06/10/2020 1411   LEUKOCYTESUR MODERATE (A) 06/10/2020 1411   Sepsis Labs Invalid input(s): PROCALCITONIN,  WBC,  LACTICIDVEN Microbiology Recent Results (from the past 240 hour(s))  Resp Panel by RT-PCR (Flu A&B, Covid) Nasopharyngeal Swab     Status: None   Collection Time: 01/05/21  7:09 PM   Specimen: Nasopharyngeal Swab; Nasopharyngeal(NP) swabs in vial transport medium  Result Value Ref Range Status   SARS Coronavirus 2 by RT PCR NEGATIVE NEGATIVE Final    Comment: (NOTE) SARS-CoV-2 target nucleic acids are NOT DETECTED.  The SARS-CoV-2 RNA is generally detectable in upper respiratory specimens during the acute phase of infection. The lowest concentration of SARS-CoV-2 viral copies this assay can detect is 138 copies/mL. A negative result does not preclude SARS-Cov-2 infection and should not be used as the sole basis for treatment or other patient management decisions. A negative result may occur with  improper specimen collection/handling, submission of specimen other than nasopharyngeal swab, presence of viral mutation(s) within the areas targeted by this assay, and inadequate number of viral copies(<138 copies/mL). A negative result must be combined with clinical observations, patient history, and epidemiological information. The expected result is Negative.  Fact Sheet for Patients:  EntrepreneurPulse.com.au  Fact  Sheet for Healthcare Providers:  IncredibleEmployment.be  This test is no t yet approved or cleared by the  [place] [employer]ural [employer] and  has been authorized for detection and/or diagnosis of SARS-CoV-2 by FDA under an Print production planner (EUA). This EUA will remain  in effect (meaning this test can be used) for the duration of the COVID-19 declaration under Section 564(b)(1) of the Act, 21 U.S.C.section 360bbb-3(b)(1), unless the authorization is terminated  or revoked sooner.       Influenza A by PCR NEGATIVE NEGATIVE Final   Influenza B by PCR NEGATIVE NEGATIVE Final    Comment: (NOTE) The Xpert Xpress SARS-CoV-2/FLU/RSV plus assay is intended as an aid in the diagnosis of influenza from Nasopharyngeal swab specimens and should not be used as a sole basis for treatment. Nasal washings and aspirates are unacceptable for Xpert Xpress SARS-CoV-2/FLU/RSV testing.  Fact Sheet for Patients: EntrepreneurPulse.com.au  Fact Sheet for Healthcare Providers: IncredibleEmployment.be  This test is not yet approved or cleared by the [place] FDA and has been authorized for detection and/or diagnosis of SARS-CoV-2 by FDA under an Emergency Use Authorization (EUA). This EUA will remain in effect (meaning this test can be used) for the duration of the COVID-19 declaration under Section 564(b)(1) of the Act, 21 U.S.C. section 360bbb-3(b)(1), unless the authorization is terminated or revoked.  Performed at [facility], [address] [place], [place] [address]   MRSA Next Gen by PCR, Nasal     Status: None   Collection Time: 01/06/21  9:07 PM   Specimen: Nasal Mucosa; Nasal Swab  Result Value Ref Range Status   MRSA by PCR Next Gen NOT DETECTED NOT DETECTED Final    Comment: (NOTE) The GeneXpert MRSA Assay (FDA approved for NASAL specimens only), is one component of a comprehensive MRSA colonization  surveillance program. It is not intended to diagnose MRSA infection nor to guide or monitor treatment for MRSA infections. Test performance is not FDA approved in patients less than 43 years old. Performed at [facility], [address], [place], [place] [address]      Time coordinating discharge: Over 30 minutes  SIGNED:   Shawna Clamp, MD  [facility] 01/07/2021, 1:14 PM Pager   If 7PM-7AM, please contact night-coverage

## 2021-01-09 LAB — HEMOGLOBIN A1C
Hgb A1c MFr Bld: 8.3 % — ABNORMAL HIGH (ref 4.8–5.6)
Mean Plasma Glucose: 191.51 mg/dL

## 2021-02-22 ENCOUNTER — Ambulatory Visit: Payer: Self-pay | Admitting: Neurology

## 2021-03-23 ENCOUNTER — Ambulatory Visit: Payer: Self-pay | Admitting: Neurology

## 2021-03-24 ENCOUNTER — Encounter: Payer: Self-pay | Admitting: Neurology

## 2021-05-03 ENCOUNTER — Other Ambulatory Visit: Payer: Self-pay | Admitting: Internal Medicine

## 2021-05-03 DIAGNOSIS — R7989 Other specified abnormal findings of blood chemistry: Secondary | ICD-10-CM

## 2021-05-19 ENCOUNTER — Other Ambulatory Visit: Payer: Self-pay

## 2021-05-19 ENCOUNTER — Ambulatory Visit
Admission: RE | Admit: 2021-05-19 | Discharge: 2021-05-19 | Disposition: A | Discharge status: Home / Self Care | Payer: BC Managed Care – PPO | Source: Ambulatory Visit | Attending: Internal Medicine | Admitting: Internal Medicine

## 2021-05-19 DIAGNOSIS — R7989 Other specified abnormal findings of blood chemistry: Secondary | ICD-10-CM | POA: Insufficient documentation

## 2021-05-23 ENCOUNTER — Other Ambulatory Visit (HOSPITAL_COMMUNITY): Payer: Self-pay | Admitting: Gastroenterology

## 2021-05-23 ENCOUNTER — Other Ambulatory Visit: Payer: Self-pay | Admitting: Gastroenterology

## 2021-05-23 DIAGNOSIS — R7989 Other specified abnormal findings of blood chemistry: Secondary | ICD-10-CM

## 2021-06-02 ENCOUNTER — Ambulatory Visit: Admission: RE | Admit: 2021-06-02 | Payer: BC Managed Care – PPO | Source: Ambulatory Visit

## 2021-06-16 ENCOUNTER — Ambulatory Visit: Admission: RE | Admit: 2021-06-16 | Payer: BC Managed Care – PPO | Source: Ambulatory Visit

## 2021-06-23 ENCOUNTER — Ambulatory Visit
Admission: RE | Admit: 2021-06-23 | Discharge: 2021-06-23 | Disposition: A | Discharge status: Home / Self Care | Payer: BC Managed Care – PPO | Source: Ambulatory Visit | Attending: Gastroenterology | Admitting: Gastroenterology

## 2021-06-23 DIAGNOSIS — R7989 Other specified abnormal findings of blood chemistry: Secondary | ICD-10-CM | POA: Diagnosis present

## 2021-06-27 ENCOUNTER — Other Ambulatory Visit: Payer: Self-pay | Admitting: Gastroenterology

## 2021-06-27 DIAGNOSIS — R7989 Other specified abnormal findings of blood chemistry: Secondary | ICD-10-CM

## 2021-08-02 ENCOUNTER — Other Ambulatory Visit: Payer: Self-pay | Admitting: Gastroenterology

## 2021-08-02 DIAGNOSIS — R7989 Other specified abnormal findings of blood chemistry: Secondary | ICD-10-CM

## 2021-08-09 NOTE — Progress Notes (Signed)
Patient on schedule for Liver biopsy 08/18/2021, called and spoke with patient on phone with pre procedure instructions given. Will hold ASA 81 mg LD 4/24 per Radiology/Dr Suttle., to arrive at 0730, NPO after MN prior to procedure as well as driver post procedure/recovery/discharge. Stated understanding. ?

## 2021-08-16 ENCOUNTER — Other Ambulatory Visit: Payer: Self-pay | Admitting: Physician Assistant

## 2021-08-17 NOTE — H&P (Signed)
? ? ? ?Chief Complaint: ?Transaminitis ? ?Referring Physician(s): ?Velna Ochs, MD ? ?Supervising Physician: Michaelle Birks ? ?Patient Status: Bradley Lopez ? ?History of Present Illness: ?Bradley Lopez is a 46 y.o. male who was seen by his PCP for his routine annual physical. ? ?Lab work showed elevated LFTs. ? ?LFTs= ?AST 86 (H) 8 - 39 U/L  ?ALT 113 (H) 6 - 57 U/L  ?Alk Phos (alkaline Phosphatase) 1,365 (H) 34 - 104 U/L  ? ?He is here today for random liver biopsy. ? ?He is NPO. No nausea/vomiting. No Fever/chills. ROS negative. ? ? ?Past Medical History:  ?Diagnosis Date  ? Asthma   ? Diabetes mellitus without complication ([place])   ? Hypertension   ? ? ?History reviewed. No pertinent surgical history. ? ?Allergies: ?Pine ? ?Medications: ?Prior to Admission medications   ?Medication Sig Start Date End Date Taking? Authorizing Provider  ?amLODipine (NORVASC) 10 MG tablet Take 1 tablet (10 mg total) by mouth daily. 01/07/21   Shawna Clamp, MD  ?aspirin EC 81 MG EC tablet Take 1 tablet (81 mg total) by mouth daily. Swallow whole. 01/08/21   Shawna Clamp, MD  ?atorvastatin (LIPITOR) 80 MG tablet Take 1 tablet (80 mg total) by mouth daily. 01/08/21   Shawna Clamp, MD  ?cloNIDine (CATAPRES) 0.1 MG tablet Take 0.1 mg by mouth 2 (two) times daily. 12/15/20   [provider]  ?clopidogrel (PLAVIX) 75 MG tablet Take 1 tablet (75 mg total) by mouth daily. 01/08/21   Shawna Clamp, MD  ?furosemide (LASIX) 40 MG tablet Take 1 tablet (40 mg total) by mouth 2 (two) times daily. 06/17/20   Sharen Hones, MD  ?glipiZIDE (GLUCOTROL XL) 5 MG 24 hr tablet Take 5 mg by mouth daily. 08/20/20   [provider]  ?hydrALAZINE (APRESOLINE) 50 MG tablet Take 1 tablet (50 mg total) by mouth every 8 (eight) hours. 01/07/21   Shawna Clamp, MD  ?metoCLOPramide (REGLAN) 5 MG tablet Take 1 tablet (5 mg total) by mouth every 8 (eight) hours as needed for up to 7 days for nausea. 06/17/20 06/24/20  Sharen Hones, MD  ?metoprolol  tartrate (LOPRESSOR) 50 MG tablet Take 1 tablet (50 mg total) by mouth 2 (two) times daily. 06/17/20   Sharen Hones, MD  ?potassium chloride (KLOR-CON) 20 MEQ packet Take 40 mEq by mouth once for 1 dose. 01/07/21 01/07/21  Shawna Clamp, MD  ?temazepam (RESTORIL) 15 MG capsule Take 15 mg by mouth at bedtime as needed for sleep. 09/15/20   [provider]  ?  ? ?Family History  ?Problem Relation Age of Onset  ? Hypertension Mother   ? ? ?Social History  ? ?Socioeconomic History  ? Marital status: Married  ?  Spouse name: Not on file  ? Number of children: Not on file  ? Years of education: Not on file  ? Highest education level: Not on file  ?Occupational History  ? Not on file  ?Tobacco Use  ? Smoking status: Never  ? Smokeless tobacco: Never  ?Vaping Use  ? Vaping Use: Never used  ?Substance and Sexual Activity  ? Alcohol use: Not Currently  ? Drug use: Not Currently  ? Sexual activity: Not on file  ?Other Topics Concern  ? Not on file  ?Social History Narrative  ? Not on file  ? ?Social Determinants of Health  ? ?Financial Resource Strain: Not on file  ?Food Insecurity: Not on file  ?Transportation Needs: Not on file  ?Physical Activity: Not  on file  ?Stress: Not on file  ?Social Connections: Not on file  ? ? ? ?Review of Systems: A 12 point ROS discussed and pertinent positives are indicated in the HPI above.  All other systems are negative. ? ?Review of Systems ? ?Vital Signs: ?BP 137/80   Pulse 71   Temp 98.4 ?F (36.9 ?C) (Oral)   Resp 15   Ht 6' 3"  (1.905 m)   Wt 184 lb (83.5 kg)   SpO2 100%   BMI 23.00 kg/m?  ? ? ?Physical Exam ?Vitals reviewed.  ?Constitutional:   ?   Appearance: Normal appearance.  ?HENT:  ?   Head: Normocephalic and atraumatic.  ?Eyes:  ?   Extraocular Movements: Extraocular movements intact.  ?Cardiovascular:  ?   Rate and Rhythm: Normal rate and regular rhythm.  ?Pulmonary:  ?   Effort: Pulmonary effort is normal. No respiratory distress.  ?   Breath sounds: Normal breath  sounds.  ?Abdominal:  ?   General: There is no distension.  ?   Palpations: Abdomen is soft.  ?   Tenderness: There is no abdominal tenderness.  ?Musculoskeletal:     ?   General: Normal range of motion.  ?   Cervical back: Normal range of motion.  ?Skin: ?   General: Skin is warm and dry.  ?Neurological:  ?   General: No focal deficit present.  ?   Mental Status: He is alert and oriented to person, place, and time.  ?Psychiatric:     ?   Mood and Affect: Mood normal.     ?   Behavior: Behavior normal.     ?   Thought Content: Thought content normal.     ?   Judgment: Judgment normal.  ? ? ?Imaging: ?No results found. ? ?Labs: ? ?CBC: ?Recent Labs  ?  01/05/21 ?1624 01/06/21 ?0811 01/07/21 ?2119 08/18/21 ?0804  ?WBC 8.5 9.3 9.3 12.6*  ?HGB 9.3* 10.0* 9.2* 9.6*  ?HCT 26.3* 27.8* 27.6* 28.6*  ?PLT 295 279 295 326  ? ? ?COAGS: ?Recent Labs  ?  01/05/21 ?1624 08/18/21 ?0804  ?INR 1.0 0.9  ?APTT 29  --   ? ? ?BMP: ?Recent Labs  ?  01/05/21 ?1624 01/06/21 ?0811 01/07/21 ?0541  ?NA 138 139 138  ?K 3.0* 3.0* 2.9*  ?CL 101 104 103  ?CO2 29 30 32  ?GLUCOSE 177* 183* 187*  ?BUN 22* 24* 22*  ?CALCIUM 8.2* 8.0* 7.9*  ?CREATININE 2.05* 2.07* 1.96*  ?GFRNONAA 40* 40* 42*  ? ? ?LIVER FUNCTION TESTS: ?Recent Labs  ?  01/05/21 ?1624  ?BILITOT 0.8  ?AST 38  ?ALT 33  ?ALKPHOS 183*  ?PROT 5.5*  ?ALBUMIN 2.2*  ? ? ?TUMOR MARKERS: ?No results for input(s): AFPTM, CEA, CA199, CHROMGRNA in the last 8760 hours. ? ?Assessment and Plan: ? ?Elevated LFTs ? ?Will proceed with image guided random liver biopsy today by Dr. Maryelizabeth Kaufmann ? ?Risks and benefits of random liver biopsy was discussed with the patient and/or patient's family including, but not limited to bleeding, infection, damage to adjacent structures or low yield requiring additional tests. ? ?All of the questions were answered and there is agreement to proceed. ? ?Consent signed and in chart. ? ? ?Thank you for allowing our service to participate in [name] 's care. ? ?Electronically  Signed: ?Murrell Redden, PA-C   ?08/18/2021, 9:38 AM ? ? ? ? ? ?I spent a total of  15 Minutes  in face to face in  clinical consultation, greater than 50% of which was counseling/coordinating care for random liver biopsy. ? ?

## 2021-08-18 ENCOUNTER — Ambulatory Visit
Admission: RE | Admit: 2021-08-18 | Discharge: 2021-08-18 | Disposition: A | Discharge status: Home / Self Care | Payer: BC Managed Care – PPO | Source: Ambulatory Visit | Attending: Gastroenterology | Admitting: Gastroenterology

## 2021-08-18 DIAGNOSIS — Z7984 Long term (current) use of oral hypoglycemic drugs: Secondary | ICD-10-CM | POA: Diagnosis not present

## 2021-08-18 DIAGNOSIS — Z0001 Encounter for general adult medical examination with abnormal findings: Secondary | ICD-10-CM | POA: Diagnosis not present

## 2021-08-18 DIAGNOSIS — R7401 Elevation of levels of liver transaminase levels: Secondary | ICD-10-CM | POA: Insufficient documentation

## 2021-08-18 DIAGNOSIS — R7989 Other specified abnormal findings of blood chemistry: Secondary | ICD-10-CM

## 2021-08-18 DIAGNOSIS — E119 Type 2 diabetes mellitus without complications: Secondary | ICD-10-CM | POA: Insufficient documentation

## 2021-08-18 LAB — CBC
HCT: 28.6 % — ABNORMAL LOW (ref 39.0–52.0)
Hemoglobin: 9.6 g/dL — ABNORMAL LOW (ref 13.0–17.0)
MCH: 29.8 pg (ref 26.0–34.0)
MCHC: 33.6 g/dL (ref 30.0–36.0)
MCV: 88.8 fL (ref 80.0–100.0)
Platelets: 326 10*3/uL (ref 150–400)
RBC: 3.22 MIL/uL — ABNORMAL LOW (ref 4.22–5.81)
RDW: 14.4 % (ref 11.5–15.5)
WBC: 12.6 10*3/uL — ABNORMAL HIGH (ref 4.0–10.5)
nRBC: 0 % (ref 0.0–0.2)

## 2021-08-18 LAB — GLUCOSE, CAPILLARY
Glucose-Capillary: 106 mg/dL — ABNORMAL HIGH (ref 70–99)
Glucose-Capillary: 119 mg/dL — ABNORMAL HIGH (ref 70–99)

## 2021-08-18 LAB — PROTIME-INR
INR: 0.9 (ref 0.8–1.2)
Prothrombin Time: 12.5 seconds (ref 11.4–15.2)

## 2021-08-18 MED ORDER — FENTANYL CITRATE (PF) 100 MCG/2ML IJ SOLN
INTRAMUSCULAR | Status: AC | PRN
  Administered 2021-08-18: 25 ug via INTRAVENOUS
  Administered 2021-08-18: 50 ug via INTRAVENOUS

## 2021-08-18 MED ORDER — MIDAZOLAM HCL 2 MG/2ML IJ SOLN
INTRAMUSCULAR | Status: AC
  Administered 2021-08-18: 1 mg via INTRAVENOUS
  Filled 2021-08-18: qty 2

## 2021-08-18 MED ORDER — MIDAZOLAM HCL 2 MG/2ML IJ SOLN: 1.0000 mg | Freq: Once | INTRAMUSCULAR | Status: DC

## 2021-08-18 MED ORDER — MIDAZOLAM HCL 2 MG/2ML IJ SOLN: 1.0000 mg | Freq: Once | INTRAMUSCULAR | Status: AC

## 2021-08-18 MED ORDER — FENTANYL CITRATE (PF) 100 MCG/2ML IJ SOLN
INTRAMUSCULAR | Status: AC
  Filled 2021-08-18: qty 2

## 2021-08-18 MED ORDER — HYDRALAZINE HCL 20 MG/ML IJ SOLN
20.0000 mg | Freq: Once | INTRAMUSCULAR | Status: AC
  Administered 2021-08-18: 20 mg via INTRAVENOUS

## 2021-08-18 MED ORDER — SODIUM CHLORIDE 0.9 % IV SOLN: INTRAVENOUS | Status: DC

## 2021-08-18 MED ORDER — MIDAZOLAM HCL 2 MG/2ML IJ SOLN
INTRAMUSCULAR | Status: AC | PRN
  Administered 2021-08-18: .5 mg via INTRAVENOUS

## 2021-08-18 MED ORDER — HYDRALAZINE HCL 20 MG/ML IJ SOLN
INTRAMUSCULAR | Status: AC
Start: 2021-08-18 — End: 2021-08-18
  Filled 2021-08-18: qty 1

## 2021-08-18 MED ORDER — LABETALOL HCL 5 MG/ML IV SOLN
INTRAVENOUS | Status: AC
  Filled 2021-08-18: qty 4

## 2021-08-18 MED ORDER — HYDRALAZINE HCL 20 MG/ML IJ SOLN
INTRAMUSCULAR | Status: AC
  Filled 2021-08-18: qty 2

## 2021-08-18 NOTE — Progress Notes (Signed)
Patient BP 170/88.  Maximiano Coss, PA aware. Was given parameters to monitor when to administer medication.  ?

## 2021-08-18 NOTE — Progress Notes (Signed)
Patient clinically stable post Liver biopsy  per DR Mugweru, vitals stable pre and post procedure. Received Versed 1.5 mg along with Fentanyl 75 mcg IV for procedure. Report given to [facility] post procedure/specials. ?

## 2021-08-18 NOTE — Procedures (Signed)
Vascular and Interventional Radiology Procedure Note ? ?Patient: Bradley Lopez ?DOB: [birth year] ?Medical Record Number: [id number] ?Note Date/Time: 08/18/21 9:01 AM  ? ?Performing Physician: Michaelle Birks, MD ?Assistant(s): None ? ?Diagnosis: T2DM ? ?Procedure: LIVER BIOPSY, NON-TARGETED ? ?Anesthesia: Conscious Sedation ?Complications: None ?Estimated Blood Loss: Minimal ?Specimens: Sent for Pathology ? ?Findings:  ?Successful Ultrasound-guided biopsy of Liver. ?A total of 3 samples were obtained. ?Hemostasis of the tract was achieved using Gelfoam Slurry Embolization. ? ?Plan: Bed rest for 2 hours. ? ?See detailed procedure note with images in PACS. ?The patient tolerated the procedure well without incident or complication and was returned to Recovery in stable condition.  ? ? ?Michaelle Birks, MD ?[facility] ?[facility] ? ? ?Pager. [phone] ?Clinic. [phone]  ?

## 2021-09-15 LAB — SURGICAL PATHOLOGY

## 2021-09-21 ENCOUNTER — Encounter: Payer: Self-pay | Admitting: Gastroenterology

## 2021-09-22 ENCOUNTER — Encounter
Admission: RE | Disposition: A | Discharge status: Home / Self Care | Payer: Self-pay | Source: Home / Self Care | Attending: Gastroenterology

## 2021-09-22 ENCOUNTER — Ambulatory Visit: Payer: BC Managed Care – PPO | Admitting: Anesthesiology

## 2021-09-22 ENCOUNTER — Encounter: Payer: Self-pay | Admitting: Gastroenterology

## 2021-09-22 ENCOUNTER — Ambulatory Visit
Admission: RE | Admit: 2021-09-22 | Discharge: 2021-09-22 | Disposition: A | Discharge status: Home / Self Care | Payer: BC Managed Care – PPO | Attending: Gastroenterology | Admitting: Gastroenterology

## 2021-09-22 DIAGNOSIS — K640 First degree hemorrhoids: Secondary | ICD-10-CM | POA: Insufficient documentation

## 2021-09-22 DIAGNOSIS — Z8673 Personal history of transient ischemic attack (TIA), and cerebral infarction without residual deficits: Secondary | ICD-10-CM | POA: Insufficient documentation

## 2021-09-22 DIAGNOSIS — E1151 Type 2 diabetes mellitus with diabetic peripheral angiopathy without gangrene: Secondary | ICD-10-CM | POA: Insufficient documentation

## 2021-09-22 DIAGNOSIS — R7989 Other specified abnormal findings of blood chemistry: Secondary | ICD-10-CM | POA: Insufficient documentation

## 2021-09-22 DIAGNOSIS — L299 Pruritus, unspecified: Secondary | ICD-10-CM | POA: Diagnosis not present

## 2021-09-22 DIAGNOSIS — Z794 Long term (current) use of insulin: Secondary | ICD-10-CM | POA: Insufficient documentation

## 2021-09-22 DIAGNOSIS — R933 Abnormal findings on diagnostic imaging of other parts of digestive tract: Secondary | ICD-10-CM | POA: Insufficient documentation

## 2021-09-22 DIAGNOSIS — Z86718 Personal history of other venous thrombosis and embolism: Secondary | ICD-10-CM | POA: Diagnosis not present

## 2021-09-22 DIAGNOSIS — Z79899 Other long term (current) drug therapy: Secondary | ICD-10-CM | POA: Diagnosis not present

## 2021-09-22 DIAGNOSIS — I10 Essential (primary) hypertension: Secondary | ICD-10-CM | POA: Diagnosis not present

## 2021-09-22 DIAGNOSIS — K5730 Diverticulosis of large intestine without perforation or abscess without bleeding: Secondary | ICD-10-CM | POA: Diagnosis not present

## 2021-09-22 DIAGNOSIS — J45909 Unspecified asthma, uncomplicated: Secondary | ICD-10-CM | POA: Diagnosis not present

## 2021-09-22 DIAGNOSIS — Z7984 Long term (current) use of oral hypoglycemic drugs: Secondary | ICD-10-CM | POA: Diagnosis not present

## 2021-09-22 HISTORY — DX: Allergy, unspecified, initial encounter: T78.40XA

## 2021-09-22 HISTORY — PX: COLONOSCOPY: SHX5424

## 2021-09-22 LAB — GLUCOSE, CAPILLARY: Glucose-Capillary: 89 mg/dL (ref 70–99)

## 2021-09-22 SURGERY — COLONOSCOPY
Anesthesia: General

## 2021-09-22 MED ORDER — PROPOFOL 500 MG/50ML IV EMUL
INTRAVENOUS | Status: AC
  Filled 2021-09-22: qty 50

## 2021-09-22 MED ORDER — SODIUM CHLORIDE 0.9 % IV SOLN
INTRAVENOUS | Status: DC
  Administered 2021-09-22: 20 mL/h via INTRAVENOUS

## 2021-09-22 MED ORDER — LIDOCAINE HCL (CARDIAC) PF 100 MG/5ML IV SOSY
PREFILLED_SYRINGE | INTRAVENOUS | Status: DC | PRN
  Administered 2021-09-22: 50 mg via INTRAVENOUS

## 2021-09-22 MED ORDER — PROPOFOL 500 MG/50ML IV EMUL
INTRAVENOUS | Status: DC | PRN
  Administered 2021-09-22: 150 ug/kg/min via INTRAVENOUS

## 2021-09-22 MED ORDER — PROPOFOL 10 MG/ML IV BOLUS
INTRAVENOUS | Status: DC | PRN
  Administered 2021-09-22: 50 mg via INTRAVENOUS

## 2021-09-22 MED ORDER — LIDOCAINE HCL (PF) 2 % IJ SOLN
INTRAMUSCULAR | Status: AC
  Filled 2021-09-22: qty 5

## 2021-09-22 NOTE — Op Note (Addendum)
[facility] Patient Name: Bradley Lopez Procedure Date: 09/22/2021 7:36 AM MRN: [id number] Account #: [id number] Date of Birth: [birth year] Admit Type: Outpatient Age: 46 Room: [facility] ENDO ROOM 1 Gender: Male Note Status: Supervisor Override Instrument Name: Park Meo [id number] Procedure:             Colonoscopy Indications:           Suspected colitis Providers:             Rueben Bash, DO Referring MD:          Baxter Hire, MD (Referring MD) Medicines:             Monitored Anesthesia Care Complications:         No immediate complications. Estimated blood loss:                         Minimal. Procedure:             Pre-Anesthesia Assessment:                        - Prior to the procedure, a History and Physical was                         performed, and patient medications and allergies were                         reviewed. The patient is competent. The risks and                         benefits of the procedure and the sedation options and                         risks were discussed with the patient. All questions                         were answered and informed consent was obtained.                         Patient identification and proposed procedure were                         verified by the physician, the nurse, the anesthetist                         and the technician in the endoscopy suite. Mental                         Status Examination: alert and oriented. Airway                         Examination: normal oropharyngeal airway and neck                         mobility. Respiratory Examination: clear to                         auscultation. CV Examination: RRR, no murmurs, no S3  or S4. Prophylactic Antibiotics: The patient does not                         require prophylactic antibiotics. Prior                         Anticoagulants: The patient has taken no previous                          anticoagulant or antiplatelet agents. ASA Grade                         Assessment: III - A patient with severe systemic                         disease. After reviewing the risks and benefits, the                         patient was deemed in satisfactory condition to                         undergo the procedure. The anesthesia plan was to use                         monitored anesthesia care (MAC). Immediately prior to                         administration of medications, the patient was                         re-assessed for adequacy to receive sedatives. The                         heart rate, respiratory rate, oxygen saturations,                         blood pressure, adequacy of pulmonary ventilation, and                         response to care were monitored throughout the                         procedure. The physical status of the patient was                         re-assessed after the procedure.                        After obtaining informed consent, the colonoscope was                         passed under direct vision. Throughout the procedure,                         the patient's blood pressure, pulse, and oxygen                         saturations were monitored continuously. The  Colonoscope was introduced through the anus and                         advanced to the the cecum, identified by appendiceal                         orifice and ileocecal valve. The colonoscopy was                         performed without difficulty. The patient tolerated                         the procedure well. The quality of the bowel                         preparation was evaluated using the BBPS [facility] Bowel                         Preparation Scale) with scores of: Right Colon = 2                         (minor amount of residual staining, small fragments of                         stool and/or opaque liquid, but mucosa seen well),                          Transverse Colon = 3 (entire mucosa seen well with no                         residual staining, small fragments of stool or opaque                         liquid) and Left Colon = 3 (entire mucosa seen well                         with no residual staining, small fragments of stool or                         opaque liquid). The total BBPS score equals 8. The                         quality of the bowel preparation was excellent. The                         ileocecal valve, appendiceal orifice, and rectum were                         photographed. Findings:      The perianal and digital rectal examinations were normal. Pertinent       negatives include normal sphincter tone.      A 3 to 4 mm polyp was found in the ascending colon. The polyp was       sessile. The polyp was removed with a cold snare. Resection and       retrieval were complete. Estimated blood loss was minimal.      A few  small-mouthed diverticula were found in the entire colon.       Estimated blood loss: none.      Non-bleeding internal hemorrhoids were found during retroflexion. The       hemorrhoids were Grade I (internal hemorrhoids that do not prolapse).       Estimated blood loss: none.      Normal mucosa was found in the entire colon. Biopsies for histology were       taken with a cold forceps from the right colon and left colon for       evaluation of microscopic colitis. Estimated blood loss was minimal.      The exam was otherwise without abnormality on direct and retroflexion       views. Impression:            - One 3 to 4 mm polyp in the ascending colon, removed                         with a cold snare. Resected and retrieved.                        - Diverticulosis in the entire examined colon.                        - Non-bleeding internal hemorrhoids.                        - Normal mucosa in the entire examined colon. Biopsied.                        - The examination was otherwise normal on direct and                          retroflexion views. Recommendation:        - Discharge patient to home.                        - Resume previous diet.                        - Continue present medications.                        - Await pathology results.                        - Repeat colonoscopy for surveillance based on                         pathology results.                        - Return to GI office as previously scheduled.                        - The findings and recommendations were discussed with                         the patient. Procedure Code(s):     --- Professional ---  45385, Colonoscopy, flexible; with removal of                         tumor(s), polyp(s), or other lesion(s) by snare                         technique                        45380, 59, Colonoscopy, flexible; with biopsy, single                         or multiple Diagnosis Code(s):     --- Professional ---                        Z12.11, Encounter for screening for malignant neoplasm                         of colon                        K63.5, Polyp of colon                        K64.0, First degree hemorrhoids                        K57.30, Diverticulosis of large intestine without                         perforation or abscess without bleeding CPT copyright 2019 American Medical Association. All rights reserved. The codes documented in this report are preliminary and upon coder review may  be revised to meet current compliance requirements. Attending Participation:      I personally performed the entire procedure. Volney American, DO Annamaria Helling DO, DO 09/22/2021 8:24:48 AM This report has been signed electronically. Number of Addenda: 0 Note Initiated On: 09/22/2021 7:36 AM Scope Withdrawal Time: 0 hours 15 minutes 20 seconds  Total Procedure Duration: 0 hours 24 minutes 29 seconds  Estimated Blood Loss:  Estimated blood loss was minimal.      [facility]

## 2021-09-22 NOTE — H&P (Signed)
*  Correction- pt has been off Eliquis since September. Only taking asa

## 2021-09-22 NOTE — Anesthesia Postprocedure Evaluation (Signed)
Anesthesia Post Note  Patient: Bradley Lopez  Procedure(s) Performed: COLONOSCOPY  Patient location during evaluation: Endoscopy Anesthesia Type: General Level of consciousness: awake and alert Pain management: pain level controlled Vital Signs Assessment: post-procedure vital signs reviewed and stable Respiratory status: spontaneous breathing, nonlabored ventilation and respiratory function stable Cardiovascular status: blood pressure returned to baseline and stable Postop Assessment: no apparent nausea or vomiting Anesthetic complications: no   No notable events documented.   Last Vitals:  Vitals:   09/22/21 0840 09/22/21 0850  BP: (!) 165/83 (!) 166/81  Pulse: 69 69  Resp: (!) 22 (!) 8  Temp:    SpO2: 100% 100%    Last Pain:  Vitals:   09/22/21 0840  TempSrc:   PainSc: 0-No pain                 [place] Ouch

## 2021-09-22 NOTE — Interval H&P Note (Signed)
History and Physical Interval Note: Preprocedure H&P from 09/22/21  was reviewed and there was no interval change after seeing and examining the patient.  Written consent was obtained from the patient after discussion of risks, benefits, and alternatives. Patient has consented to proceed with Colonoscopy with possible intervention   09/22/2021 7:42 AM  Bradley Lopez  has presented today for surgery, with the diagnosis of Elevated LFTs (R79.89) Abnormal magnetic resonance cholangiopancreatography (MRCP) (R93.3).  The various methods of treatment have been discussed with the patient and family. After consideration of risks, benefits and other options for treatment, the patient has consented to  Procedure(s): COLONOSCOPY (N/A) as a surgical intervention.  The patient's history has been reviewed, patient examined, no change in status, stable for surgery.  I have reviewed the patient's chart and labs.  Questions were answered to the patient's satisfaction.     Annamaria Helling

## 2021-09-22 NOTE — H&P (Signed)
Pre-Procedure H&P   Patient ID: Bradley Lopez is a 46 y.o. male.  Gastroenterology Provider: Annamaria Helling, DO  Referring Provider: Laurine Blazer, PA PCP: Baxter Hire, MD  Date: 09/22/2021  HPI Bradley Lopez is a 46 y.o. male who presents today for Colonoscopy for rule out colitis. Patient seen in the office with elevated alkaline phosphatase secondary to suspected biliary disease.  MRI MRCP was performed and was negative for signs of PSC.  Underwent liver biopsy which demonstrated liver disease.  Colonoscopy being performed to rule out colitis.  Has noted some pruritis. Restarted Hydralazine due to elevated blood pressure  Patient is Eliquis has been held for this procedure.  Alk phos 1258 total bili 0.4 AST 41 ALT 63  Past Medical History:  Diagnosis Date   Allergy    Asthma    Diabetes mellitus without complication ([place])    Hypertension     History reviewed. No pertinent surgical history.  Family History No h/o GI disease or malignancy  Review of Systems  Constitutional:  Positive for appetite change (improving). Negative for activity change, chills, diaphoresis, fatigue, fever and unexpected weight change.  HENT:  Negative for trouble swallowing and voice change.   Respiratory:  Negative for shortness of breath and wheezing.   Cardiovascular:  Positive for leg swelling. Negative for chest pain and palpitations.  Gastrointestinal:  Negative for abdominal distention, abdominal pain, anal bleeding, blood in stool, constipation, diarrhea, nausea and vomiting.  Musculoskeletal:  Negative for arthralgias and myalgias.  Skin:  Negative for color change and pallor.       Itching  Neurological:  Negative for dizziness, syncope and weakness.  Psychiatric/Behavioral:  Negative for confusion. The patient is not nervous/anxious.   All other systems reviewed and are negative.   Medications No current facility-administered medications on file prior to encounter.    Current Outpatient Medications on File Prior to Encounter  Medication Sig Dispense Refill   amLODipine (NORVASC) 10 MG tablet Take 1 tablet (10 mg total) by mouth daily. 30 tablet 1   aspirin EC 81 MG EC tablet Take 1 tablet (81 mg total) by mouth daily. Swallow whole. 30 tablet 11   atorvastatin (LIPITOR) 80 MG tablet Take 1 tablet (80 mg total) by mouth daily. 30 tablet 1   cloNIDine (CATAPRES) 0.1 MG tablet Take 0.1 mg by mouth 2 (two) times daily.     furosemide (LASIX) 40 MG tablet Take 1 tablet (40 mg total) by mouth 2 (two) times daily. 30 tablet 0   glipiZIDE (GLUCOTROL XL) 5 MG 24 hr tablet Take 5 mg by mouth daily.     hydrALAZINE (APRESOLINE) 50 MG tablet Take 1 tablet (50 mg total) by mouth every 8 (eight) hours. 90 tablet 1   insulin detemir (LEVEMIR) 100 UNIT/ML injection Inject 12 Units into the skin at bedtime.     insulin glargine (LANTUS) 100 UNIT/ML injection Inject 10 Units into the skin daily.     lisinopril (ZESTRIL) 20 MG tablet Take 20 mg by mouth daily.     metolazone (ZAROXOLYN) 5 MG tablet Take 5 mg by mouth daily.     metoprolol tartrate (LOPRESSOR) 50 MG tablet Take 1 tablet (50 mg total) by mouth 2 (two) times daily. 60 tablet 0   mirtazapine (REMERON) 15 MG tablet Take 15 mg by mouth at bedtime.     temazepam (RESTORIL) 15 MG capsule Take 15 mg by mouth at bedtime as needed for sleep.     apixaban (  ELIQUIS) 5 MG TABS tablet Take 5 mg by mouth 2 (two) times daily. (Patient not taking: Reported on 09/21/2021)     clopidogrel (PLAVIX) 75 MG tablet Take 1 tablet (75 mg total) by mouth daily. (Patient not taking: Reported on 08/18/2021) 30 tablet 0   metoCLOPramide (REGLAN) 5 MG tablet Take 1 tablet (5 mg total) by mouth every 8 (eight) hours as needed for up to 7 days for nausea. 21 tablet 0   potassium chloride (KLOR-CON) 20 MEQ packet Take 40 mEq by mouth once for 1 dose. 4 packet 0    Pertinent medications related to GI and procedure were reviewed by me with the  patient prior to the procedure   Current Facility-Administered Medications:    0.9 %  sodium chloride infusion, , Intravenous, Continuous, Annamaria Helling, DO      Allergies  Allergen Reactions   Pine Hives and Itching   Allergies were reviewed by me prior to the procedure  Objective   Body mass index is 23.25 kg/m. Vitals:   09/22/21 0714  BP: (!) 182/87  Pulse: 74  Resp: 20  Temp: 98.7 F (37.1 C)  TempSrc: Temporal  SpO2: 100%  Weight: 84.4 kg  Height: _0  (1.905 m)     Physical Exam Vitals and nursing note reviewed.  Constitutional:      General: He is not in acute distress.    Appearance: Normal appearance. He is not ill-appearing, toxic-appearing or diaphoretic.  HENT:     Head: Normocephalic and atraumatic.     Nose: Nose normal.     Mouth/Throat:     Mouth: Mucous membranes are moist.     Pharynx: Oropharynx is clear.  Eyes:     General: No scleral icterus.    Extraocular Movements: Extraocular movements intact.  Cardiovascular:     Rate and Rhythm: Normal rate and regular rhythm.     Heart sounds: Normal heart sounds. No murmur heard.   No friction rub. No gallop.  Pulmonary:     Effort: Pulmonary effort is normal. No respiratory distress.     Breath sounds: Normal breath sounds. No wheezing, rhonchi or rales.  Abdominal:     General: Abdomen is flat. Bowel sounds are normal. There is no distension.     Palpations: Abdomen is soft.     Tenderness: There is no abdominal tenderness. There is no guarding or rebound.  Musculoskeletal:     Cervical back: Neck supple.     Right lower leg: No edema.     Left lower leg: No edema.  Skin:    General: Skin is warm and dry.     Coloration: Skin is not jaundiced or pale.  Neurological:     General: No focal deficit present.     Mental Status: He is alert and oriented to person, place, and time. Mental status is at baseline.  Psychiatric:        Mood and Affect: Mood normal.        Behavior:  Behavior normal.        Thought Content: Thought content normal.        Judgment: Judgment normal.     Assessment:  Bradley Lopez is a 46 y.o. male  who presents today for Colonoscopy for rule out colitis.  Plan:  Colonoscopy with possible intervention today  Colonoscopy with possible biopsy, control of bleeding, polypectomy, and interventions as necessary has been discussed with the patient/patient representative. Informed consent was obtained from the patient/patient  representative after explaining the indication, nature, and risks of the procedure including but not limited to death, bleeding, perforation, missed neoplasm/lesions, cardiorespiratory compromise, and reaction to medications. Opportunity for questions was given and appropriate answers were provided. Patient/patient representative has verbalized understanding is amenable to undergoing the procedure.   Annamaria Helling, DO  [facility]  Portions of the record may have been created with voice recognition software. Occasional wrong-word or 'sound-a-like' substitutions may have occurred due to the inherent limitations of voice recognition software.  Read the chart carefully and recognize, using context, where substitutions may have occurred.

## 2021-09-22 NOTE — Anesthesia Preprocedure Evaluation (Addendum)
Anesthesia Evaluation  Patient identified by MRN, date of birth, ID band Patient awake    Reviewed: Allergy & Precautions, NPO status , Patient's Chart, lab work & pertinent test results, reviewed documented beta blocker date and time   Airway Mallampati: III  TM Distance: >3 FB Neck ROM: full    Dental  (+) Missing,    Pulmonary asthma ,    Pulmonary exam normal        Cardiovascular Exercise Tolerance: Good hypertension, Pt. on medications and Pt. on home beta blockers + Peripheral Vascular Disease  Normal cardiovascular exam     Neuro/Psych PSYCHIATRIC DISORDERS Anxiety retinopathy TIA   GI/Hepatic negative GI ROS, Elevated LFTs    Endo/Other  diabetes, Type 2  Renal/GU CRFRenal disease  negative genitourinary   Musculoskeletal   Abdominal Normal abdominal exam  (+)   Peds  Hematology  (+) Blood dyscrasia, anemia ,   Anesthesia Other Findings H/o DVT  Past Medical History: No date: Allergy No date: Asthma No date: Diabetes mellitus without complication (HCC) No date: Hypertension  History reviewed. No pertinent surgical history.     Reproductive/Obstetrics negative OB ROS                            Anesthesia Physical Anesthesia Plan  ASA: 3  Anesthesia Plan: General   Post-op Pain Management: Minimal or no pain anticipated   Induction: Intravenous  PONV Risk Score and Plan: Propofol infusion and TIVA  Airway Management Planned: Natural Airway and Nasal Cannula  Additional Equipment:   Intra-op Plan:   Post-operative Plan:   Informed Consent: I have reviewed the patients History and Physical, chart, labs and discussed the procedure including the risks, benefits and alternatives for the proposed anesthesia with the patient or authorized representative who has indicated his/her understanding and acceptance.     Dental Advisory Given  Plan Discussed with:  Anesthesiologist, CRNA and Surgeon  Anesthesia Plan Comments:        Anesthesia Quick Evaluation

## 2021-09-22 NOTE — Anesthesia Procedure Notes (Signed)
Procedure Name: MAC Date/Time: 09/22/2021 7:51 AM Performed by: Jerrye Noble, CRNA Pre-anesthesia Checklist: Patient identified, Emergency Drugs available, Suction available and Patient being monitored Patient Re-evaluated:Patient Re-evaluated prior to induction Oxygen Delivery Method: Nasal cannula

## 2021-09-22 NOTE — Transfer of Care (Signed)
Immediate Anesthesia Transfer of Care Note  Patient: Bradley Lopez  Procedure(s) Performed: COLONOSCOPY  Patient Location: PACU and Endoscopy Unit  Anesthesia Type:General  Level of Consciousness: drowsy and patient cooperative  Airway & Oxygen Therapy: Patient Spontanous Breathing  Post-op Assessment: Report given to RN and Post -op Vital signs reviewed and stable  Post vital signs: Reviewed and stable  Last Vitals:  Vitals Value Taken Time  BP 149/82 09/22/21 0821  Temp    Pulse 60 09/22/21 0820  Resp 12 09/22/21 0820  SpO2 100 % 09/22/21 0820    Last Pain:  Vitals:   09/22/21 0820  TempSrc:   PainSc: 0-No pain         Complications: No notable events documented.

## 2021-09-23 ENCOUNTER — Encounter: Payer: Self-pay | Admitting: Gastroenterology

## 2021-09-25 LAB — SURGICAL PATHOLOGY

## 2021-10-11 DIAGNOSIS — N189 Chronic kidney disease, unspecified: Secondary | ICD-10-CM | POA: Insufficient documentation

## 2021-10-11 DIAGNOSIS — N2581 Secondary hyperparathyroidism of renal origin: Secondary | ICD-10-CM | POA: Insufficient documentation

## 2021-10-12 DIAGNOSIS — R809 Proteinuria, unspecified: Secondary | ICD-10-CM | POA: Insufficient documentation

## 2021-12-14 ENCOUNTER — Other Ambulatory Visit (INDEPENDENT_AMBULATORY_CARE_PROVIDER_SITE_OTHER): Payer: Self-pay | Admitting: Nurse Practitioner

## 2021-12-14 DIAGNOSIS — N184 Chronic kidney disease, stage 4 (severe): Secondary | ICD-10-CM

## 2021-12-18 ENCOUNTER — Encounter (INDEPENDENT_AMBULATORY_CARE_PROVIDER_SITE_OTHER): Payer: Self-pay | Admitting: Vascular Surgery

## 2021-12-18 ENCOUNTER — Ambulatory Visit (INDEPENDENT_AMBULATORY_CARE_PROVIDER_SITE_OTHER): Payer: BC Managed Care – PPO | Admitting: Vascular Surgery

## 2021-12-18 ENCOUNTER — Ambulatory Visit (INDEPENDENT_AMBULATORY_CARE_PROVIDER_SITE_OTHER): Payer: Self-pay

## 2021-12-18 VITALS — BP 207/89 | HR 80 | Resp 16 | Wt 201.2 lb

## 2021-12-18 DIAGNOSIS — N184 Chronic kidney disease, stage 4 (severe): Secondary | ICD-10-CM

## 2021-12-18 DIAGNOSIS — E782 Mixed hyperlipidemia: Secondary | ICD-10-CM

## 2021-12-18 DIAGNOSIS — I82413 Acute embolism and thrombosis of femoral vein, bilateral: Secondary | ICD-10-CM | POA: Diagnosis not present

## 2021-12-18 DIAGNOSIS — E1121 Type 2 diabetes mellitus with diabetic nephropathy: Secondary | ICD-10-CM | POA: Diagnosis not present

## 2021-12-18 DIAGNOSIS — I10 Essential (primary) hypertension: Secondary | ICD-10-CM | POA: Diagnosis not present

## 2021-12-20 ENCOUNTER — Telehealth (INDEPENDENT_AMBULATORY_CARE_PROVIDER_SITE_OTHER): Payer: Self-pay

## 2021-12-20 NOTE — Telephone Encounter (Addendum)
I attempted to speak with the patient regarding a left brachial cephalic fistula with Dr. Delana Meyer. A message was left for a return call. Patient returned my call and is scheduled with Dr. Delana Meyer on 01/03/22 at the [facility] pre-op phone call is on 12/28/21 between 1-5 pm. Pre-surgical instructions were discussed and will be mailed.

## 2021-12-26 ENCOUNTER — Encounter (INDEPENDENT_AMBULATORY_CARE_PROVIDER_SITE_OTHER): Payer: Self-pay | Admitting: Vascular Surgery

## 2021-12-26 NOTE — H&P (View-Only) (Signed)
MRN : [id number]  Bradley Lopez is a 46 y.o. (08-Jun-1975) male who presents with chief complaint of check access.  History of Present Illness:   The patient is seen for evaluation for dialysis access. The patient has chronic renal insufficiency stage IV secondary to hypertension. The patient's most recent creatinine clearance is less than 20. The patient volume status has not yet become an issue. Patient's blood pressures been relatively well controlled. There are mild uremic symptoms which appear to be relatively well tolerated at this time.  The patient notes the kidney problem has been present for a long time and has been progressively getting worse.  The patient is followed by nephrology.    The patient is right-handed.  The patient has been considering the various methods of dialysis and wishes to proceed with hemodialysis and therefore creation of AV access.  No recent shortening of the patient's walking distance or new symptoms consistent with claudication.  No history of rest pain symptoms. No new ulcers or wounds of the lower extremities have occurred.  The patient denies amaurosis fugax or recent TIA symptoms. There are no recent neurological changes noted. There is no history of DVT, PE or superficial thrombophlebitis. No recent episodes of angina or shortness of breath documented.    Vein mapping shows a 3.7 mm cephalic vein left antecubital fossa  Current Meds  Medication Sig   amLODipine (NORVASC) 10 MG tablet Take 1 tablet (10 mg total) by mouth daily.   aspirin EC 81 MG EC tablet Take 1 tablet (81 mg total) by mouth daily. Swallow whole.   atorvastatin (LIPITOR) 80 MG tablet Take 1 tablet (80 mg total) by mouth daily.   cloNIDine (CATAPRES) 0.1 MG tablet Take 0.1 mg by mouth 2 (two) times daily.   furosemide (LASIX) 40 MG tablet Take 1 tablet (40 mg total) by mouth 2 (two) times daily.   glipiZIDE (GLUCOTROL XL) 5 MG 24 hr tablet Take 5 mg by  mouth daily.   insulin detemir (LEVEMIR) 100 UNIT/ML injection Inject 20 Units into the skin daily after breakfast.   lisinopril (ZESTRIL) 20 MG tablet Take 20 mg by mouth daily.   metolazone (ZAROXOLYN) 5 MG tablet Take 5 mg by mouth daily.   metoprolol tartrate (LOPRESSOR) 50 MG tablet Take 1 tablet (50 mg total) by mouth 2 (two) times daily.   mirtazapine (REMERON) 15 MG tablet Take 15 mg by mouth at bedtime.   potassium chloride (KLOR-CON) 20 MEQ packet Take 40 mEq by mouth once for 1 dose.   temazepam (RESTORIL) 15 MG capsule Take 15 mg by mouth at bedtime as needed for sleep.    Past Medical History:  Diagnosis Date   Allergy    Asthma    Diabetes mellitus without complication ([place])    Hypertension     Past Surgical History:  Procedure Laterality Date   COLONOSCOPY N/A 09/22/2021   Procedure: COLONOSCOPY;  Surgeon: Annamaria Helling, DO;  Location: [facility] ENDOSCOPY;  Service: Gastroenterology;  Laterality: N/A;    Social History Social History   Tobacco Use   Smoking status: Never   Smokeless tobacco: Never  Vaping Use   Vaping Use: Never used  Substance Use Topics   Alcohol use: Never   Drug use: Never    Family History Family History  Problem Relation Age of Onset   Hypertension Mother  Hypertension Father     Allergies  Allergen Reactions   Pine Hives and Itching     REVIEW OF SYSTEMS (Negative unless checked)  Constitutional: [] Weight loss  [] Fever  [] Chills Cardiac: [] Chest pain   [] Chest pressure   [] Palpitations   [] Shortness of breath when laying flat   [] Shortness of breath with exertion. Vascular:  [] Pain in legs with walking   [] Pain in legs at rest  [] History of DVT   [] Phlebitis   [] Swelling in legs   [] Varicose veins   [] Non-healing ulcers Pulmonary:   [] Uses home oxygen   [] Productive cough   [] Hemoptysis   [] Wheeze  [] COPD   [] Asthma Neurologic:  [] Dizziness   [] Seizures   [] History of stroke   [] History of TIA  [] Aphasia   [] Vissual  changes   [] Weakness or numbness in arm   [] Weakness or numbness in leg Musculoskeletal:   [] Joint swelling   [] Joint pain   [] Low back pain Hematologic:  [] Easy bruising  [] Easy bleeding   [] Hypercoagulable state   [] Anemic Gastrointestinal:  [] Diarrhea   [] Vomiting  [] Gastroesophageal reflux/heartburn   [] Difficulty swallowing. Genitourinary:  [x] Chronic kidney disease   [] Difficult urination  [] Frequent urination   [] Blood in urine Skin:  [] Rashes   [] Ulcers  Psychological:  [] History of anxiety   []  History of major depression.  Physical Examination  Vitals:   12/18/21 1522  BP: (!) 207/89  Pulse: 80  Resp: 16  Weight: 201 lb 3.2 oz (91.3 kg)   Body mass index is 25.15 kg/m. Gen: WD/WN, NAD Head: [place]/AT, No temporalis wasting.  Ear/Nose/Throat: Hearing grossly intact, nares w/o erythema or drainage Eyes: PER, EOMI, sclera nonicteric.  Neck: Supple, no gross masses or lesions.  No JVD.  Pulmonary:  Good air movement, no audible wheezing, no use of accessory muscles.  Cardiac: RRR, precordium non-hyperdynamic. Vascular:    Vessel Right Left  Radial Palpable Palpable  Brachial Palpable Palpable  Gastrointestinal: soft, non-distended. No guarding/no peritoneal signs.  Musculoskeletal: M/S 5/5 throughout.  No deformity.  Neurologic: CN 2-12 intact. Pain and light touch intact in extremities.  Symmetrical.  Speech is fluent. Motor exam as listed above. Psychiatric: Judgment intact, Mood & affect appropriate for pt's clinical situation. Dermatologic: No rashes or ulcers noted.  No changes consistent with cellulitis.   CBC Lab Results  Component Value Date   WBC 12.6 (H) 08/18/2021   HGB 9.6 (L) 08/18/2021   HCT 28.6 (L) 08/18/2021   MCV 88.8 08/18/2021   PLT 326 08/18/2021    BMET    Component Value Date/Time   NA 138 01/07/2021 0541   K 2.9 (L) 01/07/2021 0541   CL 103 01/07/2021 0541   CO2 32 01/07/2021 0541   GLUCOSE 187 (H) 01/07/2021 0541   BUN 22 (H)  01/07/2021 0541   CREATININE 1.96 (H) 01/07/2021 0541   CALCIUM 7.9 (L) 01/07/2021 0541   GFRNONAA 42 (L) 01/07/2021 0541   CrCl cannot be calculated (Patient's most recent lab result is older than the maximum 21 days allowed.).  COAG Lab Results  Component Value Date   INR 0.9 08/18/2021   INR 1.0 01/05/2021   INR 1.5 (H) 06/11/2020    Radiology VAS [place] UPPER EXT VEIN MAPPING (PRE-OP AVF)  Result Date: 12/18/2021 UPPER EXTREMITY VEIN MAPPING Patient Name:  AMARRI SATTERLY  Date of Exam:   12/18/2021 Medical Rec #: [id number]    Accession #:    [id number] Date of Birth: 1976-03-13    Patient Gender: M Patient  Age:   18 years Exam Location:  [facility] Procedure:      VAS [place] UPPER EXT VEIN MAPPING (PRE-OP AVF) Referring Phys: Eulogio Ditch --------------------------------------------------------------------------------  Indications: Pre-access. Performing Technologist: Almira Coaster RVS  Examination Guidelines: A complete evaluation includes B-mode imaging, spectral Doppler, color Doppler, and power Doppler as needed of all accessible portions of each vessel. Bilateral testing is considered an integral part of a complete examination. Limited examinations for reoccurring indications may be performed as noted. +-----------------+-------------+----------+--------+ Right Cephalic   Diameter (cm)Depth (cm)Findings +-----------------+-------------+----------+--------+ Shoulder             0.27                        +-----------------+-------------+----------+--------+ Prox upper arm       0.32                        +-----------------+-------------+----------+--------+ Mid upper arm        0.25                        +-----------------+-------------+----------+--------+ Dist upper arm       0.22                        +-----------------+-------------+----------+--------+ Antecubital fossa    0.34                         +-----------------+-------------+----------+--------+ Prox forearm         0.23                        +-----------------+-------------+----------+--------+ Mid forearm          0.23                        +-----------------+-------------+----------+--------+ Dist forearm         0.23                        +-----------------+-------------+----------+--------+ +-----------------+-------------+----------+--------+ Right Basilic    Diameter (cm)Depth (cm)Findings +-----------------+-------------+----------+--------+ Mid upper arm        0.20                        +-----------------+-------------+----------+--------+ Dist upper arm       0.16                        +-----------------+-------------+----------+--------+ Antecubital fossa    0.15                        +-----------------+-------------+----------+--------+ Prox forearm         0.16                        +-----------------+-------------+----------+--------+ +-----------------+-------------+----------+--------+ Left Cephalic    Diameter (cm)Depth (cm)Findings +-----------------+-------------+----------+--------+ Shoulder             0.39                        +-----------------+-------------+----------+--------+ Prox upper arm       0.31                        +-----------------+-------------+----------+--------+ Mid upper  arm        0.30                        +-----------------+-------------+----------+--------+ Dist upper arm       0.27                        +-----------------+-------------+----------+--------+ Antecubital fossa    0.37                        +-----------------+-------------+----------+--------+ Prox forearm         0.20                        +-----------------+-------------+----------+--------+ Mid forearm          0.24                        +-----------------+-------------+----------+--------+ Dist forearm         0.25                         +-----------------+-------------+----------+--------+ +-----------------+-------------+----------+--------+ Left Basilic     Diameter (cm)Depth (cm)Findings +-----------------+-------------+----------+--------+ Mid upper arm        0.47                        +-----------------+-------------+----------+--------+ Dist upper arm       0.23                        +-----------------+-------------+----------+--------+ Antecubital fossa    0.20                        +-----------------+-------------+----------+--------+ Prox forearm         0.17                        +-----------------+-------------+----------+--------+ Summary: Right: The Basilic and Cephalic Vein was mapped and No evidence of        clot seen. Left: The Basilic and Cephalic Vein was mapped and No evidence of       clot seen. *See table(s) above for measurements and observations.  Diagnosing physician: Hortencia Pilar MD Electronically signed by Hortencia Pilar MD on 12/18/2021 at 5:21:03 PM.    Final    VAS [place] UPPER EXTREMITY ARTERIAL DUPLEX  Result Date: 12/18/2021  UPPER EXTREMITY DUPLEX STUDY Patient Name:  DYRELL TUCCILLO  Date of Exam:   12/18/2021 Medical Rec #: [id number]    Accession #:    [id number] Date of Birth: 08-13-1975    Patient Gender: M Patient Age:   53 years Exam Location:  [facility] Procedure:      VAS [place] UPPER EXTREMITY ARTERIAL DUPLEX Referring Phys: Eulogio Ditch --------------------------------------------------------------------------------  Performing Technologist: Concha [place] RVT  Examination Guidelines: A complete evaluation includes B-mode imaging, spectral Doppler, color Doppler, and power Doppler as needed of all accessible portions of each vessel. Bilateral testing is considered an integral part of a complete examination. Limited examinations for reoccurring indications may be performed as noted.  Right Pre-Dialysis Findings:  +-----------------------+----------+--------------------+---------+--------+ Location               PSV (cm/s)Intralum. Diam. (cm)Waveform Comments +-----------------------+----------+--------------------+---------+--------+ Brachial Antecub. fossa76        0.49  triphasic         +-----------------------+----------+--------------------+---------+--------+ Radial Art at Wrist    94        0.27                triphasic         +-----------------------+----------+--------------------+---------+--------+ Ulnar Art at Wrist     111       0.25                triphasic         +-----------------------+----------+--------------------+---------+--------+  Left Pre-Dialysis Findings: +-----------------------+----------+--------------------+---------+--------+ Location               PSV (cm/s)Intralum. Diam. (cm)Waveform Comments +-----------------------+----------+--------------------+---------+--------+ Brachial Antecub. fossa125       0.47                triphasic         +-----------------------+----------+--------------------+---------+--------+ Radial Art at Wrist    111       0.26                triphasic         +-----------------------+----------+--------------------+---------+--------+ Ulnar Art at Wrist     103       0.20                triphasic         +-----------------------+----------+--------------------+---------+--------+  Summary:  Right: The Radial Artery, Ulnar Artery and Brachial Artery displays        Triphasic Waveforms and was measured. Left: The Radial Artery, Ulnar Artery and Brachial Artery displays       Triphasic Waveforms and was measured. *See table(s) above for measurements and observations. Electronically signed by Hortencia Pilar MD on 12/18/2021 at 5:20:51 PM.    Final      Assessment/Plan 1. Chronic kidney disease, stage 4 (severe) (HCC) Recommend:  At this time the patient does not have appropriate extremity access for  dialysis  Patient should have a left brachial cephalic fistula created.  The risks, benefits and alternative therapies were reviewed in detail with the patient.  All questions were answered.  The patient agrees to proceed with surgery.   The patient will follow up with me in the office after the surgery.   2. Essential hypertension Continue antihypertensive medications as already ordered, these medications have been reviewed and there are no changes at this time.   3. DVT femoral (deep venous thrombosis) with thrombophlebitis, bilateral (HCC) Continue Eliquis  Plan to hold it 3 days before surgery.  4. Type 2 diabetes mellitus with diabetic nephropathy, unspecified whether long term insulin use ([place]) Continue hypoglycemic medications as already ordered, these medications have been reviewed and there are no changes at this time.  Hgb A1C to be monitored as already arranged by primary service   5. Mixed hyperlipidemia Continue statin as ordered and reviewed, no changes at this time     Hortencia Pilar, MD  12/26/2021 9:28 PM

## 2021-12-26 NOTE — Progress Notes (Signed)
MRN : [id number]  Bradley Lopez is a 46 y.o. (June 07, 1975) male who presents with chief complaint of check access.  History of Present Illness:   The patient is seen for evaluation for dialysis access. The patient has chronic renal insufficiency stage IV secondary to hypertension. The patient's most recent creatinine clearance is less than 20. The patient volume status has not yet become an issue. Patient's blood pressures been relatively well controlled. There are mild uremic symptoms which appear to be relatively well tolerated at this time.  The patient notes the kidney problem has been present for a long time and has been progressively getting worse.  The patient is followed by nephrology.    The patient is right-handed.  The patient has been considering the various methods of dialysis and wishes to proceed with hemodialysis and therefore creation of AV access.  No recent shortening of the patient's walking distance or new symptoms consistent with claudication.  No history of rest pain symptoms. No new ulcers or wounds of the lower extremities have occurred.  The patient denies amaurosis fugax or recent TIA symptoms. There are no recent neurological changes noted. There is no history of DVT, PE or superficial thrombophlebitis. No recent episodes of angina or shortness of breath documented.    Vein mapping shows a 3.7 mm cephalic vein left antecubital fossa  Current Meds  Medication Sig   amLODipine (NORVASC) 10 MG tablet Take 1 tablet (10 mg total) by mouth daily.   aspirin EC 81 MG EC tablet Take 1 tablet (81 mg total) by mouth daily. Swallow whole.   atorvastatin (LIPITOR) 80 MG tablet Take 1 tablet (80 mg total) by mouth daily.   cloNIDine (CATAPRES) 0.1 MG tablet Take 0.1 mg by mouth 2 (two) times daily.   furosemide (LASIX) 40 MG tablet Take 1 tablet (40 mg total) by mouth 2 (two) times daily.   glipiZIDE (GLUCOTROL XL) 5 MG 24 hr tablet Take 5 mg by  mouth daily.   insulin detemir (LEVEMIR) 100 UNIT/ML injection Inject 20 Units into the skin daily after breakfast.   lisinopril (ZESTRIL) 20 MG tablet Take 20 mg by mouth daily.   metolazone (ZAROXOLYN) 5 MG tablet Take 5 mg by mouth daily.   metoprolol tartrate (LOPRESSOR) 50 MG tablet Take 1 tablet (50 mg total) by mouth 2 (two) times daily.   mirtazapine (REMERON) 15 MG tablet Take 15 mg by mouth at bedtime.   potassium chloride (KLOR-CON) 20 MEQ packet Take 40 mEq by mouth once for 1 dose.   temazepam (RESTORIL) 15 MG capsule Take 15 mg by mouth at bedtime as needed for sleep.    Past Medical History:  Diagnosis Date   Allergy    Asthma    Diabetes mellitus without complication ([place])    Hypertension     Past Surgical History:  Procedure Laterality Date   COLONOSCOPY N/A 09/22/2021   Procedure: COLONOSCOPY;  Surgeon: Annamaria Helling, DO;  Location: [facility] ENDOSCOPY;  Service: Gastroenterology;  Laterality: N/A;    Social History Social History   Tobacco Use   Smoking status: Never   Smokeless tobacco: Never  Vaping Use   Vaping Use: Never used  Substance Use Topics   Alcohol use: Never   Drug use: Never    Family History Family History  Problem Relation Age of Onset   Hypertension Mother  Hypertension Father     Allergies  Allergen Reactions   Pine Hives and Itching     REVIEW OF SYSTEMS (Negative unless checked)  Constitutional: [] Weight loss  [] Fever  [] Chills Cardiac: [] Chest pain   [] Chest pressure   [] Palpitations   [] Shortness of breath when laying flat   [] Shortness of breath with exertion. Vascular:  [] Pain in legs with walking   [] Pain in legs at rest  [] History of DVT   [] Phlebitis   [] Swelling in legs   [] Varicose veins   [] Non-healing ulcers Pulmonary:   [] Uses home oxygen   [] Productive cough   [] Hemoptysis   [] Wheeze  [] COPD   [] Asthma Neurologic:  [] Dizziness   [] Seizures   [] History of stroke   [] History of TIA  [] Aphasia   [] Vissual  changes   [] Weakness or numbness in arm   [] Weakness or numbness in leg Musculoskeletal:   [] Joint swelling   [] Joint pain   [] Low back pain Hematologic:  [] Easy bruising  [] Easy bleeding   [] Hypercoagulable state   [] Anemic Gastrointestinal:  [] Diarrhea   [] Vomiting  [] Gastroesophageal reflux/heartburn   [] Difficulty swallowing. Genitourinary:  [x] Chronic kidney disease   [] Difficult urination  [] Frequent urination   [] Blood in urine Skin:  [] Rashes   [] Ulcers  Psychological:  [] History of anxiety   []  History of major depression.  Physical Examination  Vitals:   12/18/21 1522  BP: (!) 207/89  Pulse: 80  Resp: 16  Weight: 201 lb 3.2 oz (91.3 kg)   Body mass index is 25.15 kg/m. Gen: WD/WN, NAD Head: [place]/AT, No temporalis wasting.  Ear/Nose/Throat: Hearing grossly intact, nares w/o erythema or drainage Eyes: PER, EOMI, sclera nonicteric.  Neck: Supple, no gross masses or lesions.  No JVD.  Pulmonary:  Good air movement, no audible wheezing, no use of accessory muscles.  Cardiac: RRR, precordium non-hyperdynamic. Vascular:    Vessel Right Left  Radial Palpable Palpable  Brachial Palpable Palpable  Gastrointestinal: soft, non-distended. No guarding/no peritoneal signs.  Musculoskeletal: M/S 5/5 throughout.  No deformity.  Neurologic: CN 2-12 intact. Pain and light touch intact in extremities.  Symmetrical.  Speech is fluent. Motor exam as listed above. Psychiatric: Judgment intact, Mood & affect appropriate for pt's clinical situation. Dermatologic: No rashes or ulcers noted.  No changes consistent with cellulitis.   CBC Lab Results  Component Value Date   WBC 12.6 (H) 08/18/2021   HGB 9.6 (L) 08/18/2021   HCT 28.6 (L) 08/18/2021   MCV 88.8 08/18/2021   PLT 326 08/18/2021    BMET    Component Value Date/Time   NA 138 01/07/2021 0541   K 2.9 (L) 01/07/2021 0541   CL 103 01/07/2021 0541   CO2 32 01/07/2021 0541   GLUCOSE 187 (H) 01/07/2021 0541   BUN 22 (H)  01/07/2021 0541   CREATININE 1.96 (H) 01/07/2021 0541   CALCIUM 7.9 (L) 01/07/2021 0541   GFRNONAA 42 (L) 01/07/2021 0541   CrCl cannot be calculated (Patient's most recent lab result is older than the maximum 21 days allowed.).  COAG Lab Results  Component Value Date   INR 0.9 08/18/2021   INR 1.0 01/05/2021   INR 1.5 (H) 06/11/2020    Radiology VAS [place] UPPER EXT VEIN MAPPING (PRE-OP AVF)  Result Date: 12/18/2021 UPPER EXTREMITY VEIN MAPPING Patient Name:  JANARD CULP  Date of Exam:   12/18/2021 Medical Rec #: [id number]    Accession #:    [id number] Date of Birth: [birth year]    Patient Gender: M Patient  Age:   46 years Exam Location:  [facility] Procedure:      VAS [place] UPPER EXT VEIN MAPPING (PRE-OP AVF) Referring Phys: Eulogio Ditch --------------------------------------------------------------------------------  Indications: Pre-access. Performing Technologist: Almira Coaster RVS  Examination Guidelines: A complete evaluation includes B-mode imaging, spectral Doppler, color Doppler, and power Doppler as needed of all accessible portions of each vessel. Bilateral testing is considered an integral part of a complete examination. Limited examinations for reoccurring indications may be performed as noted. +-----------------+-------------+----------+--------+ Right Cephalic   Diameter (cm)Depth (cm)Findings +-----------------+-------------+----------+--------+ Shoulder             0.27                        +-----------------+-------------+----------+--------+ Prox upper arm       0.32                        +-----------------+-------------+----------+--------+ Mid upper arm        0.25                        +-----------------+-------------+----------+--------+ Dist upper arm       0.22                        +-----------------+-------------+----------+--------+ Antecubital fossa    0.34                         +-----------------+-------------+----------+--------+ Prox forearm         0.23                        +-----------------+-------------+----------+--------+ Mid forearm          0.23                        +-----------------+-------------+----------+--------+ Dist forearm         0.23                        +-----------------+-------------+----------+--------+ +-----------------+-------------+----------+--------+ Right Basilic    Diameter (cm)Depth (cm)Findings +-----------------+-------------+----------+--------+ Mid upper arm        0.20                        +-----------------+-------------+----------+--------+ Dist upper arm       0.16                        +-----------------+-------------+----------+--------+ Antecubital fossa    0.15                        +-----------------+-------------+----------+--------+ Prox forearm         0.16                        +-----------------+-------------+----------+--------+ +-----------------+-------------+----------+--------+ Left Cephalic    Diameter (cm)Depth (cm)Findings +-----------------+-------------+----------+--------+ Shoulder             0.39                        +-----------------+-------------+----------+--------+ Prox upper arm       0.31                        +-----------------+-------------+----------+--------+ Mid upper  arm        0.30                        +-----------------+-------------+----------+--------+ Dist upper arm       0.27                        +-----------------+-------------+----------+--------+ Antecubital fossa    0.37                        +-----------------+-------------+----------+--------+ Prox forearm         0.20                        +-----------------+-------------+----------+--------+ Mid forearm          0.24                        +-----------------+-------------+----------+--------+ Dist forearm         0.25                         +-----------------+-------------+----------+--------+ +-----------------+-------------+----------+--------+ Left Basilic     Diameter (cm)Depth (cm)Findings +-----------------+-------------+----------+--------+ Mid upper arm        0.47                        +-----------------+-------------+----------+--------+ Dist upper arm       0.23                        +-----------------+-------------+----------+--------+ Antecubital fossa    0.20                        +-----------------+-------------+----------+--------+ Prox forearm         0.17                        +-----------------+-------------+----------+--------+ Summary: Right: The Basilic and Cephalic Vein was mapped and No evidence of        clot seen. Left: The Basilic and Cephalic Vein was mapped and No evidence of       clot seen. *See table(s) above for measurements and observations.  Diagnosing physician: Hortencia Pilar MD Electronically signed by Hortencia Pilar MD on 12/18/2021 at 5:21:03 PM.    Final    VAS [place] UPPER EXTREMITY ARTERIAL DUPLEX  Result Date: 12/18/2021  UPPER EXTREMITY DUPLEX STUDY Patient Name:  JAEVIN MEDEARIS  Date of Exam:   12/18/2021 Medical Rec #: [id number]    Accession #:    [id number] Date of Birth: Oct 09, 1975    Patient Gender: M Patient Age:   56 years Exam Location:  [facility] Procedure:      VAS [place] UPPER EXTREMITY ARTERIAL DUPLEX Referring Phys: Eulogio Ditch --------------------------------------------------------------------------------  Performing Technologist: Concha Norway RVT  Examination Guidelines: A complete evaluation includes B-mode imaging, spectral Doppler, color Doppler, and power Doppler as needed of all accessible portions of each vessel. Bilateral testing is considered an integral part of a complete examination. Limited examinations for reoccurring indications may be performed as noted.  Right Pre-Dialysis Findings:  +-----------------------+----------+--------------------+---------+--------+ Location               PSV (cm/s)Intralum. Diam. (cm)Waveform Comments +-----------------------+----------+--------------------+---------+--------+ Brachial Antecub. fossa76        0.49  triphasic         +-----------------------+----------+--------------------+---------+--------+ Radial Art at Wrist    94        0.27                triphasic         +-----------------------+----------+--------------------+---------+--------+ Ulnar Art at Wrist     111       0.25                triphasic         +-----------------------+----------+--------------------+---------+--------+  Left Pre-Dialysis Findings: +-----------------------+----------+--------------------+---------+--------+ Location               PSV (cm/s)Intralum. Diam. (cm)Waveform Comments +-----------------------+----------+--------------------+---------+--------+ Brachial Antecub. fossa125       0.47                triphasic         +-----------------------+----------+--------------------+---------+--------+ Radial Art at Wrist    111       0.26                triphasic         +-----------------------+----------+--------------------+---------+--------+ Ulnar Art at Wrist     103       0.20                triphasic         +-----------------------+----------+--------------------+---------+--------+  Summary:  Right: The Radial Artery, Ulnar Artery and Brachial Artery displays        Triphasic Waveforms and was measured. Left: The Radial Artery, Ulnar Artery and Brachial Artery displays       Triphasic Waveforms and was measured. *See table(s) above for measurements and observations. Electronically signed by Hortencia Pilar MD on 12/18/2021 at 5:20:51 PM.    Final      Assessment/Plan 1. Chronic kidney disease, stage 4 (severe) (HCC) Recommend:  At this time the patient does not have appropriate extremity access for  dialysis  Patient should have a left brachial cephalic fistula created.  The risks, benefits and alternative therapies were reviewed in detail with the patient.  All questions were answered.  The patient agrees to proceed with surgery.   The patient will follow up with me in the office after the surgery.   2. Essential hypertension Continue antihypertensive medications as already ordered, these medications have been reviewed and there are no changes at this time.   3. DVT femoral (deep venous thrombosis) with thrombophlebitis, bilateral (HCC) Continue Eliquis  Plan to hold it 3 days before surgery.  4. Type 2 diabetes mellitus with diabetic nephropathy, unspecified whether long term insulin use ([place]) Continue hypoglycemic medications as already ordered, these medications have been reviewed and there are no changes at this time.  Hgb A1C to be monitored as already arranged by primary service   5. Mixed hyperlipidemia Continue statin as ordered and reviewed, no changes at this time     Hortencia Pilar, MD  12/26/2021 9:28 PM

## 2021-12-28 ENCOUNTER — Other Ambulatory Visit: Payer: Self-pay

## 2021-12-28 ENCOUNTER — Other Ambulatory Visit (INDEPENDENT_AMBULATORY_CARE_PROVIDER_SITE_OTHER): Payer: Self-pay | Admitting: Nurse Practitioner

## 2021-12-28 ENCOUNTER — Encounter
Admission: RE | Admit: 2021-12-28 | Discharge: 2021-12-28 | Disposition: A | Discharge status: Home / Self Care | Payer: BC Managed Care – PPO | Source: Ambulatory Visit | Attending: Vascular Surgery | Admitting: Vascular Surgery

## 2021-12-28 DIAGNOSIS — N184 Chronic kidney disease, stage 4 (severe): Secondary | ICD-10-CM

## 2021-12-28 DIAGNOSIS — E876 Hypokalemia: Secondary | ICD-10-CM

## 2021-12-28 DIAGNOSIS — Z01812 Encounter for preprocedural laboratory examination: Secondary | ICD-10-CM

## 2021-12-28 DIAGNOSIS — D631 Anemia in chronic kidney disease: Secondary | ICD-10-CM

## 2021-12-28 HISTORY — DX: Pneumonia, unspecified organism: J18.9

## 2021-12-28 HISTORY — DX: Depression, unspecified: F32.A

## 2021-12-28 HISTORY — DX: Chronic kidney disease, unspecified: N18.9

## 2021-12-28 HISTORY — DX: Anemia, unspecified: D64.9

## 2021-12-28 NOTE — Patient Instructions (Addendum)
Your procedure is scheduled on: 01/03/22 - Wednesday Report to the Registration Desk on the 1st floor of the [facility]. To find out your arrival time, please call [phone] between 1PM - 3PM on: 01/02/22 - Tuesday If your arrival time is 6:00 am, do not arrive prior to that time as the [facility] entrance doors do not open until 6:00 am.  REMEMBER: Instructions that are not followed completely may result in serious medical risk, up to and including death; or upon the discretion of your surgeon and anesthesiologist your surgery may need to be rescheduled.  Do not eat food or drink any fluids after midnight the night before surgery.  No gum chewing, lozengers or hard candies.   TAKE THESE MEDICATIONS THE MORNING OF SURGERY WITH A SIP OF WATER:  - amLODipine (NORVASC)  - atorvastatin (LIPITOR) - cloNIDine (CATAPRES)    - insulin detemir (LEVEMIR) 100 - do not inject on the morning of surgery.   One week prior to surgery: Stop Anti-inflammatories (NSAIDS) such as Advil, Aleve, Ibuprofen, Motrin, Naproxen, Naprosyn and Aspirin based products such as Excedrin, Goodys Powder, BC Powder.  Stop ANY OVER THE COUNTER supplements until after surgery.  You may take Tylenol if needed for pain up until the day of surgery.  No Alcohol for 24 hours before or after surgery.  No Smoking including e-cigarettes for 24 hours prior to surgery.  No chewable tobacco products for at least 6 hours prior to surgery.  No nicotine patches on the day of surgery.  Do not use any "recreational" drugs for at least a week prior to your surgery.  Please be advised that the combination of cocaine and anesthesia may have negative outcomes, up to and including death. If you test positive for cocaine, your surgery will be cancelled.  On the morning of surgery brush your teeth with toothpaste and water, you may rinse your mouth with mouthwash if you wish. Do not swallow any toothpaste or mouthwash.  Use  CHG Soap or wipes as directed on instruction sheet.  Do not wear jewelry, make-up, hairpins, clips or nail polish.  Do not wear lotions, powders, or perfumes.   Do not shave body from the neck down 48 hours prior to surgery just in case you cut yourself which could leave a site for infection.  Also, freshly shaved skin may become irritated if using the CHG soap.  Contact lenses, hearing aids and dentures may not be worn into surgery.  Do not bring valuables to the hospital. [facility] is not responsible for any missing/lost belongings or valuables.   Notify your doctor if there is any change in your medical condition (cold, fever, infection).  Wear comfortable clothing (specific to your surgery type) to the hospital.  After surgery, you can help prevent lung complications by doing breathing exercises.  Take deep breaths and cough every 1-2 hours. Your doctor may order a device called an Incentive Spirometer to help you take deep breaths. When coughing or sneezing, hold a pillow firmly against your incision with both hands. This is called "splinting." Doing this helps protect your incision. It also decreases belly discomfort.  If you are being admitted to the hospital overnight, leave your suitcase in the car. After surgery it may be brought to your room.  If you are being discharged the day of surgery, you will not be allowed to drive home. You will need a responsible adult (18 years or older) to drive you home and stay with you  that night.   If you are taking public transportation, you will need to have a responsible adult (18 years or older) with you. Please confirm with your physician that it is acceptable to use public transportation.   Please call the [facility]. at [phone] if you have any questions about these instructions.  Surgery Visitation Policy:  Patients undergoing a surgery or procedure may have two family members or support persons with them  as long as the person is not COVID-19 positive or experiencing its symptoms.   Inpatient Visitation:    Visiting hours are 7 a.m. to 8 p.m. Up to four visitors are allowed at one time in a patient room, including children. The visitors may rotate out with other people during the day. One designated support person (adult) may remain overnight.

## 2022-01-02 ENCOUNTER — Encounter: Payer: Self-pay | Admitting: Urgent Care

## 2022-01-02 ENCOUNTER — Encounter
Admission: RE | Admit: 2022-01-02 | Discharge: 2022-01-02 | Disposition: A | Discharge status: Home / Self Care | Payer: BC Managed Care – PPO | Source: Ambulatory Visit | Attending: Vascular Surgery | Admitting: Vascular Surgery

## 2022-01-02 DIAGNOSIS — Z01812 Encounter for preprocedural laboratory examination: Secondary | ICD-10-CM

## 2022-01-02 DIAGNOSIS — N184 Chronic kidney disease, stage 4 (severe): Secondary | ICD-10-CM | POA: Insufficient documentation

## 2022-01-02 DIAGNOSIS — Z7901 Long term (current) use of anticoagulants: Secondary | ICD-10-CM | POA: Diagnosis not present

## 2022-01-02 DIAGNOSIS — E876 Hypokalemia: Secondary | ICD-10-CM | POA: Insufficient documentation

## 2022-01-02 DIAGNOSIS — D631 Anemia in chronic kidney disease: Secondary | ICD-10-CM | POA: Insufficient documentation

## 2022-01-02 DIAGNOSIS — I129 Hypertensive chronic kidney disease with stage 1 through stage 4 chronic kidney disease, or unspecified chronic kidney disease: Secondary | ICD-10-CM | POA: Diagnosis present

## 2022-01-02 DIAGNOSIS — Z01818 Encounter for other preprocedural examination: Secondary | ICD-10-CM | POA: Insufficient documentation

## 2022-01-02 DIAGNOSIS — E782 Mixed hyperlipidemia: Secondary | ICD-10-CM | POA: Diagnosis not present

## 2022-01-02 DIAGNOSIS — I82413 Acute embolism and thrombosis of femoral vein, bilateral: Secondary | ICD-10-CM | POA: Diagnosis not present

## 2022-01-02 DIAGNOSIS — E1122 Type 2 diabetes mellitus with diabetic chronic kidney disease: Secondary | ICD-10-CM | POA: Diagnosis not present

## 2022-01-02 LAB — CBC
HCT: 22.7 % — ABNORMAL LOW (ref 39.0–52.0)
Hemoglobin: 7.4 g/dL — ABNORMAL LOW (ref 13.0–17.0)
MCH: 29.6 pg (ref 26.0–34.0)
MCHC: 32.6 g/dL (ref 30.0–36.0)
MCV: 90.8 fL (ref 80.0–100.0)
Platelets: 255 10*3/uL (ref 150–400)
RBC: 2.5 MIL/uL — ABNORMAL LOW (ref 4.22–5.81)
RDW: 13.5 % (ref 11.5–15.5)
WBC: 10.4 10*3/uL (ref 4.0–10.5)
nRBC: 0 % (ref 0.0–0.2)

## 2022-01-02 LAB — BASIC METABOLIC PANEL
Anion gap: 11 (ref 5–15)
BUN: 69 mg/dL — ABNORMAL HIGH (ref 6–20)
CO2: 24 mmol/L (ref 22–32)
Calcium: 8.6 mg/dL — ABNORMAL LOW (ref 8.9–10.3)
Chloride: 107 mmol/L (ref 98–111)
Creatinine, Ser: 6.13 mg/dL — ABNORMAL HIGH (ref 0.61–1.24)
GFR, Estimated: 11 mL/min — ABNORMAL LOW (ref >60)
Glucose, Bld: 254 mg/dL — ABNORMAL HIGH (ref 70–99)
Potassium: 4.2 mmol/L (ref 3.5–5.1)
Sodium: 142 mmol/L (ref 135–145)

## 2022-01-02 LAB — PROTIME-INR
INR: 1.2 (ref 0.8–1.2)
Prothrombin Time: 14.9 seconds (ref 11.4–15.2)

## 2022-01-02 LAB — APTT: aPTT: 32 seconds (ref 24–36)

## 2022-01-02 MED ORDER — CHLORHEXIDINE GLUCONATE 0.12 % MT SOLN: 15.0000 mL | Freq: Once | OROMUCOSAL | Status: AC

## 2022-01-02 MED ORDER — CEFAZOLIN SODIUM-DEXTROSE 2-4 GM/100ML-% IV SOLN
2.0000 g | INTRAVENOUS | Status: AC
  Administered 2022-01-03: 2 g via INTRAVENOUS

## 2022-01-02 MED ORDER — FAMOTIDINE 20 MG PO TABS: 20.0000 mg | ORAL_TABLET | Freq: Once | ORAL | Status: AC

## 2022-01-02 MED ORDER — ORAL CARE MOUTH RINSE: 15.0000 mL | Freq: Once | OROMUCOSAL | Status: AC

## 2022-01-02 MED ORDER — CHLORHEXIDINE GLUCONATE CLOTH 2 % EX PADS: 6.0000 | MEDICATED_PAD | Freq: Once | CUTANEOUS | Status: DC

## 2022-01-02 MED ORDER — CHLORHEXIDINE GLUCONATE CLOTH 2 % EX PADS
6.0000 | MEDICATED_PAD | Freq: Once | CUTANEOUS | Status: AC
  Administered 2022-01-03: 6 via TOPICAL

## 2022-01-02 MED ORDER — SODIUM CHLORIDE 0.9 % IV SOLN: INTRAVENOUS | Status: DC

## 2022-01-02 NOTE — Progress Notes (Signed)
  [facility] Perioperative Services: Pre-Admission/Anesthesia Testing  Abnormal Lab Notification   Date: 01/02/22  Name: Bradley Lopez MRN:   [id number]  Re: Abnormal labs noted during PAT appointment   Notified:  Provider Name Provider Role Notification Mode  Schnier, Dolores Lory, MD Vascular Surgery Routed and/or faxed via Jones Skene, NP-C Vascular Surgery APP Routed and/or faxed via [place] and Notes:  ABNORMAL LAB VALUE(S): Lab Results  Component Value Date   HGB 7.4 (L) 01/02/2022   HCT 22.7 (L) 01/02/2022   [name] is scheduled for a LEFT ARTERIOVENOUS (AV) FISTULA CREATION (BRACHIALCEPHALIC) on 63/81/7711. Hemoglobin vales trended  back over the course of the last 2 years, with range being 8.9-10.2 g/dL.  Sending result to attending surgeon to determine if further optimization of his preoperative anemia is felt to be indicated. Patient has an active type and screen on file that was obtained today.   Honor Loh, MSN, APRN, FNP-C, CEN [facility]  Peri-operative Services Nurse Practitioner Phone: [phone] Fax: [phone] 01/02/22 1:06 PM

## 2022-01-03 ENCOUNTER — Encounter: Payer: Self-pay | Admitting: Vascular Surgery

## 2022-01-03 ENCOUNTER — Encounter
Admission: RE | Disposition: A | Discharge status: Home / Self Care | Payer: Self-pay | Source: Home / Self Care | Attending: Vascular Surgery

## 2022-01-03 ENCOUNTER — Other Ambulatory Visit: Payer: Self-pay

## 2022-01-03 ENCOUNTER — Ambulatory Visit: Payer: BC Managed Care – PPO | Admitting: Urgent Care

## 2022-01-03 ENCOUNTER — Ambulatory Visit
Admission: RE | Admit: 2022-01-03 | Discharge: 2022-01-03 | Disposition: A | Discharge status: Home / Self Care | Payer: BC Managed Care – PPO | Attending: Vascular Surgery | Admitting: Vascular Surgery

## 2022-01-03 DIAGNOSIS — N184 Chronic kidney disease, stage 4 (severe): Secondary | ICD-10-CM | POA: Insufficient documentation

## 2022-01-03 DIAGNOSIS — I129 Hypertensive chronic kidney disease with stage 1 through stage 4 chronic kidney disease, or unspecified chronic kidney disease: Secondary | ICD-10-CM | POA: Insufficient documentation

## 2022-01-03 DIAGNOSIS — Z01812 Encounter for preprocedural laboratory examination: Secondary | ICD-10-CM

## 2022-01-03 DIAGNOSIS — I82413 Acute embolism and thrombosis of femoral vein, bilateral: Secondary | ICD-10-CM | POA: Insufficient documentation

## 2022-01-03 DIAGNOSIS — Z7901 Long term (current) use of anticoagulants: Secondary | ICD-10-CM | POA: Insufficient documentation

## 2022-01-03 DIAGNOSIS — N186 End stage renal disease: Secondary | ICD-10-CM

## 2022-01-03 DIAGNOSIS — E782 Mixed hyperlipidemia: Secondary | ICD-10-CM | POA: Insufficient documentation

## 2022-01-03 DIAGNOSIS — E1122 Type 2 diabetes mellitus with diabetic chronic kidney disease: Secondary | ICD-10-CM | POA: Insufficient documentation

## 2022-01-03 HISTORY — PX: AV FISTULA PLACEMENT: SHX1204

## 2022-01-03 LAB — ABO/RH: ABO/RH(D): O POS

## 2022-01-03 LAB — GLUCOSE, CAPILLARY: Glucose-Capillary: 130 mg/dL — ABNORMAL HIGH (ref 70–99)

## 2022-01-03 LAB — POCT I-STAT, CHEM 8
BUN: 70 mg/dL — ABNORMAL HIGH (ref 6–20)
Calcium, Ion: 1.14 mmol/L — ABNORMAL LOW (ref 1.15–1.40)
Chloride: 112 mmol/L — ABNORMAL HIGH (ref 98–111)
Creatinine, Ser: 7.2 mg/dL — ABNORMAL HIGH (ref 0.61–1.24)
Glucose, Bld: 138 mg/dL — ABNORMAL HIGH (ref 70–99)
HCT: 20 % — ABNORMAL LOW (ref 39.0–52.0)
Hemoglobin: 6.8 g/dL — CL (ref 13.0–17.0)
Potassium: 4.2 mmol/L (ref 3.5–5.1)
Sodium: 137 mmol/L (ref 135–145)
TCO2: 26 mmol/L (ref 22–32)

## 2022-01-03 SURGERY — ARTERIOVENOUS (AV) FISTULA CREATION
Anesthesia: General | Laterality: Left

## 2022-01-03 MED ORDER — GLYCOPYRROLATE 0.2 MG/ML IJ SOLN
INTRAMUSCULAR | Status: AC
  Filled 2022-01-03: qty 1

## 2022-01-03 MED ORDER — SODIUM CHLORIDE 0.9 % IV SOLN: 10.0000 mL/h | Freq: Once | INTRAVENOUS | Status: DC

## 2022-01-03 MED ORDER — BUPIVACAINE LIPOSOME 1.3 % IJ SUSP
INTRAMUSCULAR | Status: DC | PRN
  Administered 2022-01-03: 10 mL

## 2022-01-03 MED ORDER — ONDANSETRON HCL 4 MG/2ML IJ SOLN
INTRAMUSCULAR | Status: AC
  Filled 2022-01-03: qty 2

## 2022-01-03 MED ORDER — EPHEDRINE SULFATE (PRESSORS) 50 MG/ML IJ SOLN
INTRAMUSCULAR | Status: DC | PRN
  Administered 2022-01-03: 10 mg via INTRAVENOUS

## 2022-01-03 MED ORDER — HYDROCODONE-ACETAMINOPHEN 5-325 MG PO TABS: 1.0000 | ORAL_TABLET | Freq: Four times a day (QID) | ORAL | 0 refills | Status: DC | PRN

## 2022-01-03 MED ORDER — CHLORHEXIDINE GLUCONATE 0.12 % MT SOLN
OROMUCOSAL | Status: AC
  Administered 2022-01-03: 15 mL via OROMUCOSAL
  Filled 2022-01-03: qty 15

## 2022-01-03 MED ORDER — KETAMINE HCL 50 MG/5ML IJ SOSY
PREFILLED_SYRINGE | INTRAMUSCULAR | Status: AC
  Filled 2022-01-03: qty 5

## 2022-01-03 MED ORDER — ACETAMINOPHEN 10 MG/ML IV SOLN
INTRAVENOUS | Status: DC | PRN
  Administered 2022-01-03: 1000 mg via INTRAVENOUS

## 2022-01-03 MED ORDER — ONDANSETRON HCL 4 MG/2ML IJ SOLN
INTRAMUSCULAR | Status: DC | PRN
  Administered 2022-01-03: 4 mg via INTRAVENOUS

## 2022-01-03 MED ORDER — PHENYLEPHRINE HCL-NACL 20-0.9 MG/250ML-% IV SOLN
INTRAVENOUS | Status: AC
Start: 2022-01-03 — End: ?
  Filled 2022-01-03: qty 250

## 2022-01-03 MED ORDER — HEPARIN 5000 UNITS IN NS 1000 ML (FLUSH)
INTRAMUSCULAR | Status: DC | PRN
  Administered 2022-01-03: 10 mL via INTRAMUSCULAR

## 2022-01-03 MED ORDER — LIDOCAINE HCL (CARDIAC) PF 100 MG/5ML IV SOSY
PREFILLED_SYRINGE | INTRAVENOUS | Status: DC | PRN
  Administered 2022-01-03: 100 mg via INTRAVENOUS

## 2022-01-03 MED ORDER — MIDAZOLAM HCL 2 MG/2ML IJ SOLN
INTRAMUSCULAR | Status: AC
  Filled 2022-01-03: qty 2

## 2022-01-03 MED ORDER — FAMOTIDINE 20 MG PO TABS
ORAL_TABLET | ORAL | Status: AC
  Administered 2022-01-03: 20 mg via ORAL
  Filled 2022-01-03: qty 1

## 2022-01-03 MED ORDER — HEPARIN SODIUM (PORCINE) 5000 UNIT/ML IJ SOLN
INTRAMUSCULAR | Status: AC
  Filled 2022-01-03: qty 1

## 2022-01-03 MED ORDER — PAPAVERINE HCL 30 MG/ML IJ SOLN
INTRAMUSCULAR | Status: AC
  Filled 2022-01-03: qty 2

## 2022-01-03 MED ORDER — FENTANYL CITRATE (PF) 100 MCG/2ML IJ SOLN
INTRAMUSCULAR | Status: AC
  Filled 2022-01-03: qty 2

## 2022-01-03 MED ORDER — FENTANYL CITRATE (PF) 100 MCG/2ML IJ SOLN
INTRAMUSCULAR | Status: DC | PRN
  Administered 2022-01-03: 75 ug via INTRAVENOUS
  Administered 2022-01-03: 25 ug via INTRAVENOUS

## 2022-01-03 MED ORDER — BUPIVACAINE HCL (PF) 0.5 % IJ SOLN
INTRAMUSCULAR | Status: DC | PRN
  Administered 2022-01-03: 10 mL

## 2022-01-03 MED ORDER — BUPIVACAINE HCL (PF) 0.5 % IJ SOLN
INTRAMUSCULAR | Status: AC
  Filled 2022-01-03: qty 30

## 2022-01-03 MED ORDER — GLYCOPYRROLATE 0.2 MG/ML IJ SOLN
INTRAMUSCULAR | Status: DC | PRN
  Administered 2022-01-03: .1 mg via INTRAVENOUS

## 2022-01-03 MED ORDER — KETAMINE HCL 10 MG/ML IJ SOLN
INTRAMUSCULAR | Status: DC | PRN
  Administered 2022-01-03 (×2): 10 mg via INTRAVENOUS

## 2022-01-03 MED ORDER — BUPIVACAINE LIPOSOME 1.3 % IJ SUSP
INTRAMUSCULAR | Status: AC
  Filled 2022-01-03: qty 20

## 2022-01-03 MED ORDER — PROPOFOL 10 MG/ML IV BOLUS
INTRAVENOUS | Status: AC
  Filled 2022-01-03: qty 20

## 2022-01-03 MED ORDER — DEXAMETHASONE SODIUM PHOSPHATE 10 MG/ML IJ SOLN
INTRAMUSCULAR | Status: AC
  Filled 2022-01-03: qty 1

## 2022-01-03 MED ORDER — MIDAZOLAM HCL 2 MG/2ML IJ SOLN
INTRAMUSCULAR | Status: DC | PRN
  Administered 2022-01-03: 2 mg via INTRAVENOUS

## 2022-01-03 MED ORDER — EPHEDRINE 5 MG/ML INJ
INTRAVENOUS | Status: AC
  Filled 2022-01-03: qty 5

## 2022-01-03 MED ORDER — PROPOFOL 10 MG/ML IV BOLUS
INTRAVENOUS | Status: DC | PRN
  Administered 2022-01-03: 150 mg via INTRAVENOUS

## 2022-01-03 MED ORDER — CEFAZOLIN SODIUM-DEXTROSE 2-4 GM/100ML-% IV SOLN
INTRAVENOUS | Status: AC
  Filled 2022-01-03: qty 100

## 2022-01-03 MED ORDER — DEXAMETHASONE SODIUM PHOSPHATE 10 MG/ML IJ SOLN
INTRAMUSCULAR | Status: DC | PRN
  Administered 2022-01-03: 5 mg via INTRAVENOUS

## 2022-01-03 MED ORDER — ACETAMINOPHEN 10 MG/ML IV SOLN
INTRAVENOUS | Status: AC
  Filled 2022-01-03: qty 100

## 2022-01-03 SURGICAL SUPPLY — 55 items
APPLIER CLIP 11 MED OPEN (CLIP)
APPLIER CLIP 9.375 SM OPEN (CLIP)
BAG DECANTER FOR FLEXI CONT (MISCELLANEOUS) ×1 IMPLANT
BLADE SURG SZ11 CARB STEEL (BLADE) ×1 IMPLANT
BOOT SUTURE AID YELLOW STND (SUTURE) ×1 IMPLANT
BRUSH SCRUB EZ  4% CHG (MISCELLANEOUS)
BRUSH SCRUB EZ 4% CHG (MISCELLANEOUS) ×1 IMPLANT
CHLORAPREP W/TINT 26 (MISCELLANEOUS) ×1 IMPLANT
CLIP APPLIE 11 MED OPEN (CLIP) IMPLANT
CLIP APPLIE 9.375 SM OPEN (CLIP) IMPLANT
DERMABOND ADVANCED .7 DNX12 (GAUZE/BANDAGES/DRESSINGS) ×1 IMPLANT
DRESSING SURGICEL FIBRLLR 1X2 (HEMOSTASIS) ×1 IMPLANT
DRSG SURGICEL FIBRILLAR 1X2 (HEMOSTASIS) ×1
ELECT CAUTERY BLADE 6.4 (BLADE) ×1 IMPLANT
ELECT REM PT RETURN 9FT ADLT (ELECTROSURGICAL) ×1
ELECTRODE REM PT RTRN 9FT ADLT (ELECTROSURGICAL) ×1 IMPLANT
GEL ULTRASOUND 20GR AQUASONIC (MISCELLANEOUS) IMPLANT
GLOVE SURG SYN 8.0 (GLOVE) ×1 IMPLANT
GLOVE SURG SYN 8.0 PF PI (GLOVE) ×1 IMPLANT
GOWN STRL REUS W/ TWL LRG LVL3 (GOWN DISPOSABLE) ×1 IMPLANT
GOWN STRL REUS W/ TWL XL LVL3 (GOWN DISPOSABLE) ×1 IMPLANT
GOWN STRL REUS W/TWL LRG LVL3 (GOWN DISPOSABLE) ×1
GOWN STRL REUS W/TWL XL LVL3 (GOWN DISPOSABLE) ×1
IV NS 500ML (IV SOLUTION) ×1
IV NS 500ML BAXH (IV SOLUTION) ×1 IMPLANT
KIT TURNOVER KIT A (KITS) ×1 IMPLANT
LABEL OR SOLS (LABEL) ×1 IMPLANT
LOOP RED MAXI  1X406MM (MISCELLANEOUS) ×1
LOOP VESSEL MAXI 1X406 RED (MISCELLANEOUS) ×1 IMPLANT
LOOP VESSEL MINI 0.8X406 BLUE (MISCELLANEOUS) ×2 IMPLANT
LOOPS BLUE MINI 0.8X406MM (MISCELLANEOUS) ×2
MANIFOLD NEPTUNE II (INSTRUMENTS) ×1 IMPLANT
NDL FILTER BLUNT 18X1 1/2 (NEEDLE) ×1 IMPLANT
NDL HYPO 30X.5 LL (NEEDLE) IMPLANT
NEEDLE FILTER BLUNT 18X1 1/2 (NEEDLE) ×1 IMPLANT
NEEDLE HYPO 30X.5 LL (NEEDLE) IMPLANT
PACK EXTREMITY ARMC (MISCELLANEOUS) ×1 IMPLANT
PAD PREP 24X41 OB/GYN DISP (PERSONAL CARE ITEMS) ×1 IMPLANT
STOCKINETTE 48X4 2 PLY STRL (GAUZE/BANDAGES/DRESSINGS) ×1 IMPLANT
STOCKINETTE STRL 4IN 9604848 (GAUZE/BANDAGES/DRESSINGS) ×1 IMPLANT
SUT MNCRL+ 5-0 UNDYED PC-3 (SUTURE) ×1 IMPLANT
SUT MONOCRYL 5-0 (SUTURE) ×1
SUT PROLENE 6 0 BV (SUTURE) ×4 IMPLANT
SUT SILK 2 0 (SUTURE) ×1
SUT SILK 2-0 18XBRD TIE 12 (SUTURE) ×1 IMPLANT
SUT SILK 3 0 (SUTURE) ×1
SUT SILK 3-0 18XBRD TIE 12 (SUTURE) ×1 IMPLANT
SUT SILK 4 0 (SUTURE) ×1
SUT SILK 4-0 18XBRD TIE 12 (SUTURE) ×1 IMPLANT
SUT VIC AB 3-0 SH 27 (SUTURE) ×2
SUT VIC AB 3-0 SH 27X BRD (SUTURE) ×1 IMPLANT
SYR 20ML LL LF (SYRINGE) ×1 IMPLANT
SYR 3ML LL SCALE MARK (SYRINGE) ×1 IMPLANT
TRAP FLUID SMOKE EVACUATOR (MISCELLANEOUS) ×1 IMPLANT
WATER STERILE IRR 500ML POUR (IV SOLUTION) ×1 IMPLANT

## 2022-01-03 NOTE — Anesthesia Postprocedure Evaluation (Signed)
Anesthesia Post Note  Patient: Bradley Lopez  Procedure(s) Performed: ARTERIOVENOUS (AV) FISTULA CREATION (BRACHIALCEPHALIC) (Left)  Patient location during evaluation: PACU Anesthesia Type: General Level of consciousness: awake and awake and alert Pain management: satisfactory to patient Vital Signs Assessment: post-procedure vital signs reviewed and stable Respiratory status: nonlabored ventilation and respiratory function stable Anesthetic complications: no   No notable events documented.   Last Vitals:  Vitals:   01/03/22 1219 01/03/22 1230  BP: 119/75 (!) 149/82  Pulse: (!) 53 (!) 52  Resp:  12  Temp: (!) 36.1 C   SpO2: 100% 100%    Last Pain:  Vitals:   01/03/22 1230  TempSrc:   PainSc: 0-No pain                 VAN STAVEREN,Jaqlyn Gruenhagen

## 2022-01-03 NOTE — Op Note (Signed)
     OPERATIVE NOTE   PROCEDURE: left brachial cephalic arteriovenous fistula placement  PRE-OPERATIVE DIAGNOSIS: End Stage Renal Disease  POST-OPERATIVE DIAGNOSIS: End Stage Renal Disease  SURGEON: Hortencia Pilar  ASSISTANT(S): None  ANESTHESIA: General by LMA  ESTIMATED BLOOD LOSS: <50 cc  FINDING(S): 4.5 mm cephalic vein  SPECIMEN(S):  none  INDICATIONS:   Bradley Lopez is a 46 y.o. male who presents with end stage renal disease.  The patient is scheduled for left brachiocephalic arteriovenous fistula placement.  The patient is aware the risks include but are not limited to: bleeding, infection, steal syndrome, nerve damage, ischemic monomelic neuropathy, failure to mature, and need for additional procedures.  The patient is aware of the risks of the procedure and elects to proceed forward.  DESCRIPTION: After full informed written consent was obtained from the patient, the patient was brought back to the operating room and placed supine upon the operating table.  Prior to induction, the patient received IV antibiotics.   After obtaining adequate anesthesia, the patient was then prepped and draped in the standard fashion for a left arm access procedure.   A first assistant was required to provide a safe and appropriate environment for executing the surgery.  The assistant was integral in providing retraction, exposure, running suture providing suction and in the closing process.   A curvilinear incision was then created midway between the radial impulse and the cephalic vein. The cephalic vein was then identified and dissected circumferentially. It was marked with a surgical marker.    Attention was then turned to the brachial artery which was exposed through the same incision and looped proximally and distally. Side branches were controlled with 4-0 silk ties.  The distal segment of the vein was ligated with a  2-0 silk, and the vein was transected.  The proximal segment was  interrogated with serial dilators.  The vein accepted up to a  4.5 mm dilator without any difficulty. Heparinized saline was infused into the vein and clamped it with a small bulldog.  At this point, I reset my exposure of the brachial artery and controlled the artery with vessel loops proximally and distally.  An arteriotomy was then made with a #11 blade, and extended with a Potts scissor.  Heparinized saline was injected proximal and distal into the radial artery.  The vein was then approximated to the artery while the artery was in its native bed and subsequently the vein was beveled using Potts scissors. The vein was then sewn to the artery in an end-to-side configuration with a running stitch of 6-0 Prolene.  Prior to completing this anastomosis Flushing maneuvers were performed and the artery was allowed to forward and back bleed.  There was no evidence of clot from any vessels.  I completed the anastomosis in the usual fashion and then released all vessel loops and clamps.    There was good  thrill in the venous outflow, and there was 1+ palpable radial pulse.  At this point, I irrigated out the surgical wound.  There was no further active bleeding.  The subcutaneous tissue was reapproximated with a running stitch of 3-0 Vicryl.  The skin was then reapproximated with a running subcuticular stitch of 4-0 Vicryl.  The skin was then cleaned, dried, and reinforced with Dermabond.    The patient tolerated this procedure well.   COMPLICATIONS: None  CONDITION: [facility]  Office: [phone]   01/03/2022, 12:18 PM

## 2022-01-03 NOTE — Transfer of Care (Signed)
Immediate Anesthesia Transfer of Care Note  Patient: Bradley Lopez  Procedure(s) Performed: ARTERIOVENOUS (AV) FISTULA CREATION (BRACHIALCEPHALIC) (Left)  Patient Location: PACU  Anesthesia Type:General  Level of Consciousness: drowsy  Airway & Oxygen Therapy: Patient Spontanous Breathing and Patient connected to face mask oxygen  Post-op Assessment: Report given to RN and Post -op Vital signs reviewed and stable  Post vital signs: Reviewed and stable  Last Vitals:  Vitals Value Taken Time  BP 119/75 01/03/22 1219  Temp 36.1 1219  Pulse 51 01/03/22 1222  Resp 12 01/03/22 1222  SpO2 100 % 01/03/22 1222  Vitals shown include unvalidated device data.  Last Pain:  Vitals:   01/03/22 0944  TempSrc: Temporal  PainSc: 2          Complications: No notable events documented.

## 2022-01-03 NOTE — Anesthesia Preprocedure Evaluation (Addendum)
Anesthesia Evaluation  Patient identified by MRN, date of birth, ID band Patient awake    Reviewed: Allergy & Precautions, NPO status , Patient's Chart, lab work & pertinent test results, reviewed documented beta blocker date and time   Airway Mallampati: III  TM Distance: >3 FB Neck ROM: full    Dental  (+) Missing,    Pulmonary neg pulmonary ROS, asthma ,    Pulmonary exam normal breath sounds clear to auscultation       Cardiovascular Exercise Tolerance: Good hypertension, Pt. on medications and Pt. on home beta blockers + Peripheral Vascular Disease  negative cardio ROS Normal cardiovascular exam Rhythm:Regular Rate:Normal     Neuro/Psych PSYCHIATRIC DISORDERS Anxiety retinopathy TIAnegative neurological ROS  negative psych ROS   GI/Hepatic negative GI ROS, Neg liver ROS, Elevated LFTs    Endo/Other  negative endocrine ROSdiabetes, Type 2, Insulin Dependent  Renal/GU ESRFRenal disease  negative genitourinary   Musculoskeletal   Abdominal Normal abdominal exam  (+)   Peds  Hematology negative hematology ROS (+) Blood dyscrasia, anemia ,   Anesthesia Other Findings H/o DVT  Past Medical History: No date: Allergy No date: Asthma No date: Diabetes mellitus without complication (HCC) No date: Hypertension  History reviewed. No pertinent surgical history.     Reproductive/Obstetrics negative OB ROS                            Anesthesia Physical  Anesthesia Plan  ASA: 3  Anesthesia Plan: General   Post-op Pain Management:    Induction: Intravenous  PONV Risk Score and Plan: Propofol infusion and TIVA  Airway Management Planned: Nasal Cannula and Natural Airway  Additional Equipment:   Intra-op Plan:   Post-operative Plan:   Informed Consent: I have reviewed the patients History and Physical, chart, labs and discussed the procedure including the risks, benefits and  alternatives for the proposed anesthesia with the patient or authorized representative who has indicated his/her understanding and acceptance.     Dental Advisory Given  Plan Discussed with: Anesthesiologist, CRNA and Surgeon  Anesthesia Plan Comments:       Anesthesia Quick Evaluation

## 2022-01-03 NOTE — Discharge Instructions (Signed)
AMBULATORY SURGERY  DISCHARGE INSTRUCTIONS   The drugs that you were given will stay in your system until tomorrow so for the next 24 hours you should not:  Drive an automobile Make any legal decisions Drink any alcoholic beverage   You may resume regular meals tomorrow.  Today it is better to start with liquids and gradually work up to solid foods.  You may eat anything you prefer, but it is better to start with liquids, then soup and crackers, and gradually work up to solid foods.   Please notify your doctor immediately if you have any unusual bleeding, trouble breathing, redness and pain at the surgery site, drainage, fever, or pain not relieved by medication.     Information for Discharge Teaching: EXPAREL (bupivacaine liposome injectable suspension)   Your surgeon or anesthesiologist gave you EXPAREL(bupivacaine) to help control your pain after surgery.  EXPAREL is a local anesthetic that provides pain relief by numbing the tissue around the surgical site. EXPAREL is designed to release pain medication over time and can control pain for up to 72 hours. Depending on how you respond to EXPAREL, you may require less pain medication during your recovery.  Possible side effects: Temporary loss of sensation or ability to move in the area where bupivacaine was injected. Nausea, vomiting, constipation Rarely, numbness and tingling in your mouth or lips, lightheadedness, or anxiety may occur. Call your doctor right away if you think you may be experiencing any of these sensations, or if you have other questions regarding possible side effects.  Follow all other discharge instructions given to you by your surgeon or nurse. Eat a healthy diet and drink plenty of water or other fluids.  If you return to the hospital for any reason within 96 hours following the administration of EXPAREL, it is important for health care providers to know that you have received this anesthetic. A teal  colored band has been placed on your arm with the date, time and amount of EXPAREL you have received in order to alert and inform your health care providers. Please leave this armband in place for the full 96 hours following administration, and then you may remove the band.           Please contact your physician with any problems or Same Day Surgery at [phone], Monday through Friday 6 am to 4 pm, or [facility] Main number at [phone].  

## 2022-01-03 NOTE — Interval H&P Note (Signed)
History and Physical Interval Note:  01/03/2022 9:56 AM  Bradley Lopez  has presented today for surgery, with the diagnosis of CHRONIC KIDNEY DISEASE STAGE 4.  The various methods of treatment have been discussed with the patient and family. After consideration of risks, benefits and other options for treatment, the patient has consented to  Procedure(s): ARTERIOVENOUS (AV) FISTULA CREATION (BRACHIALCEPHALIC) (Left) as a surgical intervention.  The patient's history has been reviewed, patient examined, no change in status, stable for surgery.  I have reviewed the patient's chart and labs.  Questions were answered to the patient's satisfaction.     Hortencia Pilar

## 2022-01-03 NOTE — Anesthesia Procedure Notes (Signed)
Procedure Name: LMA Insertion Date/Time: 01/03/2022 10:44 AM  Performed by: Cammie Sickle, CRNAPre-anesthesia Checklist: Patient identified, Patient being monitored, Timeout performed, Emergency Drugs available and Suction available Patient Re-evaluated:Patient Re-evaluated prior to induction Oxygen Delivery Method: Circle system utilized Preoxygenation: Pre-oxygenation with 100% oxygen Induction Type: IV induction Ventilation: Mask ventilation without difficulty LMA: LMA inserted LMA Size: 4.0 Tube type: Oral Number of attempts: 1 Placement Confirmation: positive ETCO2 and breath sounds checked- equal and bilateral Tube secured with: Tape Dental Injury: Teeth and Oropharynx as per pre-operative assessment

## 2022-01-04 ENCOUNTER — Encounter: Payer: Self-pay | Admitting: Vascular Surgery

## 2022-01-06 LAB — TYPE AND SCREEN
ABO/RH(D): O POS
Antibody Screen: NEGATIVE
Unit division: 0

## 2022-01-06 LAB — BPAM RBC
Blood Product Expiration Date: 202310172359
Unit Type and Rh: 5100

## 2022-01-06 LAB — PREPARE RBC (CROSSMATCH)

## 2022-01-17 ENCOUNTER — Encounter: Payer: Self-pay | Admitting: Oncology

## 2022-01-17 ENCOUNTER — Inpatient Hospital Stay: Payer: BC Managed Care – PPO | Attending: Oncology | Admitting: Oncology

## 2022-01-17 ENCOUNTER — Inpatient Hospital Stay: Payer: BC Managed Care – PPO

## 2022-01-17 VITALS — BP 137/67 | HR 62 | Temp 96.4°F | Resp 18 | Wt 197.0 lb

## 2022-01-17 DIAGNOSIS — N184 Chronic kidney disease, stage 4 (severe): Secondary | ICD-10-CM | POA: Insufficient documentation

## 2022-01-17 DIAGNOSIS — D631 Anemia in chronic kidney disease: Secondary | ICD-10-CM | POA: Insufficient documentation

## 2022-01-17 LAB — FOLATE: Folate: 17.3 ng/mL (ref >5.9)

## 2022-01-17 LAB — COMPREHENSIVE METABOLIC PANEL
ALT: 15 U/L (ref 0–44)
AST: 23 U/L (ref 15–41)
Albumin: 3.6 g/dL (ref 3.5–5.0)
Alkaline Phosphatase: 144 U/L — ABNORMAL HIGH (ref 38–126)
Anion gap: 9 (ref 5–15)
BUN: 93 mg/dL — ABNORMAL HIGH (ref 6–20)
CO2: 24 mmol/L (ref 22–32)
Calcium: 8.8 mg/dL — ABNORMAL LOW (ref 8.9–10.3)
Chloride: 104 mmol/L (ref 98–111)
Creatinine, Ser: 8.76 mg/dL — ABNORMAL HIGH (ref 0.61–1.24)
GFR, Estimated: 7 mL/min — ABNORMAL LOW (ref >60)
Glucose, Bld: 197 mg/dL — ABNORMAL HIGH (ref 70–99)
Potassium: 5.1 mmol/L (ref 3.5–5.1)
Sodium: 137 mmol/L (ref 135–145)
Total Bilirubin: 0.3 mg/dL (ref 0.3–1.2)
Total Protein: 7.8 g/dL (ref 6.5–8.1)

## 2022-01-17 LAB — CBC WITH DIFFERENTIAL/PLATELET
Abs Immature Granulocytes: 0.1 10*3/uL — ABNORMAL HIGH (ref 0.00–0.07)
Basophils Absolute: 0.1 10*3/uL (ref 0.0–0.1)
Basophils Relative: 1 %
Eosinophils Absolute: 1 10*3/uL — ABNORMAL HIGH (ref 0.0–0.5)
Eosinophils Relative: 10 %
HCT: 23 % — ABNORMAL LOW (ref 39.0–52.0)
Hemoglobin: 7.7 g/dL — ABNORMAL LOW (ref 13.0–17.0)
Immature Granulocytes: 1 %
Lymphocytes Relative: 22 %
Lymphs Abs: 2.2 10*3/uL (ref 0.7–4.0)
MCH: 30.1 pg (ref 26.0–34.0)
MCHC: 33.5 g/dL (ref 30.0–36.0)
MCV: 89.8 fL (ref 80.0–100.0)
Monocytes Absolute: 0.8 10*3/uL (ref 0.1–1.0)
Monocytes Relative: 8 %
Neutro Abs: 5.8 10*3/uL (ref 1.7–7.7)
Neutrophils Relative %: 58 %
Platelets: 259 10*3/uL (ref 150–400)
RBC: 2.56 MIL/uL — ABNORMAL LOW (ref 4.22–5.81)
RDW: 13.4 % (ref 11.5–15.5)
WBC: 10 10*3/uL (ref 4.0–10.5)
nRBC: 0 % (ref 0.0–0.2)

## 2022-01-17 LAB — RETICULOCYTES
Immature Retic Fract: 5 % (ref 2.3–15.9)
RBC.: 2.54 MIL/uL — ABNORMAL LOW (ref 4.22–5.81)
Retic Count, Absolute: 39.1 10*3/uL (ref 19.0–186.0)
Retic Ct Pct: 1.5 % (ref 0.4–3.1)

## 2022-01-17 LAB — FERRITIN: Ferritin: 223 ng/mL (ref 24–336)

## 2022-01-17 LAB — TSH: TSH: 2.618 u[IU]/mL (ref 0.350–4.500)

## 2022-01-17 LAB — VITAMIN B12: Vitamin B-12: 538 pg/mL (ref 180–914)

## 2022-01-17 NOTE — Progress Notes (Signed)
Hematology/Oncology Consult note [facility] Telephone:([phone] [phone]  Patient Care Team: Baxter Hire, MD as PCP - General (Internal Medicine)   Name of the patient: Bradley Lopez  [id number]  07/23/1975    Reason for referral-Dr. Lanora Manis   Referring physician-anemia  Date of visit: 01/17/22   History of presenting illness- Patient is a 46 year old male with a past medical history significant for poorly controlled type 2 diabetes, hypertension hyperlipidemia and stage V CKD, nephrotic syndrome who has been referred for Anemia.  Most recent CBC from 01/11/2022 showed white cell count of 10.2, H&H of 7.7/23.5 with an MCV of 89.4 and a platelet count of 159.  His hemoglobin was 9.9 in April 2022 and has been gradually drifting down since then.  We do not have any recent iron studies or other anemia work-up so far.  ECOG PS- 2 Pain scale- 0   Review of systems- Review of Systems  Constitutional:  Negative for chills, fever, malaise/fatigue and weight loss.  HENT:  Negative for congestion, ear discharge and nosebleeds.   Eyes:  Negative for blurred vision.  Respiratory:  Negative for cough, hemoptysis, sputum production, shortness of breath and wheezing.   Cardiovascular:  Negative for chest pain, palpitations, orthopnea and claudication.  Gastrointestinal:  Negative for abdominal pain, blood in stool, constipation, diarrhea, heartburn, melena, nausea and vomiting.  Genitourinary:  Negative for dysuria, flank pain, frequency, hematuria and urgency.  Musculoskeletal:  Negative for back pain, joint pain and myalgias.  Skin:  Negative for rash.  Neurological:  Negative for dizziness, tingling, focal weakness, seizures, weakness and headaches.  Endo/Heme/Allergies:  Does not bruise/bleed easily.  Psychiatric/Behavioral:  Negative for depression and suicidal ideas. The patient does not have insomnia.     Allergies  Allergen Reactions   Pine  Hives and Itching    Patient Active Problem List   Diagnosis Date Noted   Anemia in chronic kidney disease 10/11/2021   Hyperparathyroidism due to renal insufficiency ([place]) 10/11/2021   Hyperphosphatemia 10/11/2021   TIA (transient ischemic attack) 01/05/2021   Chronic kidney disease, stage 4 (severe) ([place]) 06/23/2020   Hypokalemia 06/16/2020   Iron deficiency anemia due to chronic blood loss 06/16/2020   Acute renal failure superimposed on stage 3a chronic kidney disease ([place]) 06/16/2020   Hypertensive urgency 06/14/2020   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 ([place]) 06/14/2020   Anemia due to chronic kidney disease 06/14/2020   Hypertensive encephalopathy syndrome    Vision blurring    Nausea & vomiting 06/11/2020   Anasarca 06/11/2020   Elevated troponin 06/11/2020   Hypomagnesemia 06/11/2020   CKD stage 3 due to type 2 diabetes mellitus ([place]) 06/11/2020   Hypertensive emergency 06/10/2020   Lymphedema 05/28/2020   Pain and swelling of lower leg 05/02/2020   Acute right-sided low back pain without sciatica 11/21/2018   History of DVT of lower extremity 11/21/2018   Mixed hyperlipidemia 11/21/2018   DVT femoral (deep venous thrombosis) with thrombophlebitis, bilateral ([place]) 08/20/2018   Essential hypertension 08/20/2018   Generalized anxiety disorder 08/20/2018   Diabetes ([place]) 08/20/2018     Past Medical History:  Diagnosis Date   Allergy    Anemia    Asthma    Chronic kidney disease    Depression    situational   Diabetes mellitus without complication ([place])    Hypertension    Pneumonia      Past Surgical History:  Procedure Laterality Date   AV FISTULA PLACEMENT Left 01/03/2022  Procedure: ARTERIOVENOUS (AV) FISTULA CREATION (BRACHIALCEPHALIC);  Surgeon: Katha Cabal, MD;  Location: [facility];  Service: Vascular;  Laterality: Left;   COLONOSCOPY N/A 09/22/2021   Procedure: COLONOSCOPY;  Surgeon: Annamaria Helling, DO;  Location: [facility];   Service: Gastroenterology;  Laterality: N/A;   EYE SURGERY Bilateral    detatch retina    Social History   Socioeconomic History   Marital status: Married    Spouse name: Malachy Mood   Number of children: Not on file   Years of education: Not on file   Highest education level: Not on file  Occupational History   Not on file  Tobacco Use   Smoking status: Never   Smokeless tobacco: Never  Vaping Use   Vaping Use: Never used  Substance and Sexual Activity   Alcohol use: Never   Drug use: Never   Sexual activity: Yes  Other Topics Concern   Not on file  Social History Narrative   Not on file   Social Determinants of Health   Financial Resource Strain: Not on file  Food Insecurity: Not on file  Transportation Needs: Not on file  Physical Activity: Not on file  Stress: Not on file  Social Connections: Not on file  Intimate Partner Violence: Not on file     Family History  Problem Relation Age of Onset   Coronary artery disease Mother    Hypertension Father    Prostate cancer Father      Current Outpatient Medications:    amLODipine (NORVASC) 10 MG tablet, Take 1 tablet (10 mg total) by mouth daily., Disp: 30 tablet, Rfl: 1   aspirin EC 81 MG EC tablet, Take 1 tablet (81 mg total) by mouth daily. Swallow whole., Disp: 30 tablet, Rfl: 11   atorvastatin (LIPITOR) 80 MG tablet, Take 1 tablet (80 mg total) by mouth daily., Disp: 30 tablet, Rfl: 1   cloNIDine (CATAPRES) 0.1 MG tablet, Take 0.1 mg by mouth 3 (three) times daily. 0.3 mg BID, Disp: , Rfl:    furosemide (LASIX) 40 MG tablet, Take 1 tablet (40 mg total) by mouth 2 (two) times daily., Disp: 30 tablet, Rfl: 0   glipiZIDE (GLUCOTROL XL) 5 MG 24 hr tablet, Take 5 mg by mouth daily., Disp: , Rfl:    insulin detemir (LEVEMIR) 100 UNIT/ML injection, Inject 20 Units into the skin daily after breakfast., Disp: , Rfl:    lisinopril (ZESTRIL) 20 MG tablet, Take 20 mg by mouth daily., Disp: , Rfl:    metolazone (ZAROXOLYN) 5  MG tablet, Take 5 mg by mouth daily., Disp: , Rfl:    metoprolol tartrate (LOPRESSOR) 50 MG tablet, Take 1 tablet (50 mg total) by mouth 2 (two) times daily., Disp: 60 tablet, Rfl: 0   mirtazapine (REMERON) 15 MG tablet, Take 15 mg by mouth at bedtime., Disp: , Rfl:    apixaban (ELIQUIS) 5 MG TABS tablet, Take 5 mg by mouth 2 (two) times daily. (Patient not taking: Reported on 09/21/2021), Disp: , Rfl:    clopidogrel (PLAVIX) 75 MG tablet, Take 1 tablet (75 mg total) by mouth daily. (Patient not taking: Reported on 08/18/2021), Disp: 30 tablet, Rfl: 0   hydrALAZINE (APRESOLINE) 50 MG tablet, Take 1 tablet (50 mg total) by mouth every 8 (eight) hours. (Patient not taking: Reported on 12/18/2021), Disp: 90 tablet, Rfl: 1   HYDROcodone-acetaminophen (NORCO) 5-325 MG tablet, Take 1 tablet by mouth every 6 (six) hours as needed for moderate pain or severe pain. (Patient not  taking: Reported on 01/17/2022), Disp: 30 tablet, Rfl: 0   insulin glargine (LANTUS) 100 UNIT/ML injection, Inject 10 Units into the skin daily. (Patient not taking: Reported on 12/18/2021), Disp: , Rfl:    temazepam (RESTORIL) 15 MG capsule, Take 15 mg by mouth at bedtime as needed for sleep. (Patient not taking: Reported on 01/17/2022), Disp: , Rfl:    Physical exam:  Vitals:   01/17/22 1333  BP: 137/67  Pulse: 62  Resp: 18  Temp: (!) 96.4 F (35.8 C)  SpO2: 100%  Weight: 197 lb (89.4 kg)   Physical Exam Constitutional:      General: He is not in acute distress. Cardiovascular:     Rate and Rhythm: Normal rate and regular rhythm.     Heart sounds: Normal heart sounds.  Pulmonary:     Effort: Pulmonary effort is normal.     Breath sounds: Normal breath sounds.  Abdominal:     General: Bowel sounds are normal.     Palpations: Abdomen is soft.  Musculoskeletal:     Comments: Trace bilateral edema  Skin:    General: Skin is warm and dry.  Neurological:     Mental Status: He is alert and oriented to person, place, and  time.           Latest Ref Rng & Units 01/03/2022    9:54 AM  CMP  Glucose 70 - 99 mg/dL 138   BUN 6 - 20 mg/dL 70   Creatinine 0.61 - 1.24 mg/dL 7.20   Sodium 135 - 145 mmol/L 137   Potassium 3.5 - 5.1 mmol/L 4.2   Chloride 98 - 111 mmol/L 112       Latest Ref Rng & Units 01/03/2022    9:54 AM  CBC  Hemoglobin 13.0 - 17.0 g/dL 6.8   Hematocrit 39.0 - 52.0 % 20.0     No images are attached to the encounter.  VAS [place] UPPER EXT VEIN MAPPING (PRE-OP AVF)  Result Date: 12/18/2021 UPPER EXTREMITY VEIN MAPPING Patient Name:  Bradley Lopez  Date of Exam:   12/18/2021 Medical Rec #: [id number]    Accession #:    [id number] Date of Birth: 1975-05-21    Patient Gender: M Patient Age:   56 years Exam Location:  [facility] Procedure:      VAS [place] UPPER EXT VEIN MAPPING (PRE-OP AVF) Referring Phys: Eulogio Ditch --------------------------------------------------------------------------------  Indications: Pre-access. Performing Technologist: Almira Coaster RVS  Examination Guidelines: A complete evaluation includes B-mode imaging, spectral Doppler, color Doppler, and power Doppler as needed of all accessible portions of each vessel. Bilateral testing is considered an integral part of a complete examination. Limited examinations for reoccurring indications may be performed as noted. +-----------------+-------------+----------+--------+ Right Cephalic   Diameter (cm)Depth (cm)Findings +-----------------+-------------+----------+--------+ Shoulder             0.27                        +-----------------+-------------+----------+--------+ Prox upper arm       0.32                        +-----------------+-------------+----------+--------+ Mid upper arm        0.25                        +-----------------+-------------+----------+--------+ Dist upper arm       0.22                        +-----------------+-------------+----------+--------+  Antecubital fossa    0.34                         +-----------------+-------------+----------+--------+ Prox forearm         0.23                        +-----------------+-------------+----------+--------+ Mid forearm          0.23                        +-----------------+-------------+----------+--------+ Dist forearm         0.23                        +-----------------+-------------+----------+--------+ +-----------------+-------------+----------+--------+ Right Basilic    Diameter (cm)Depth (cm)Findings +-----------------+-------------+----------+--------+ Mid upper arm        0.20                        +-----------------+-------------+----------+--------+ Dist upper arm       0.16                        +-----------------+-------------+----------+--------+ Antecubital fossa    0.15                        +-----------------+-------------+----------+--------+ Prox forearm         0.16                        +-----------------+-------------+----------+--------+ +-----------------+-------------+----------+--------+ Left Cephalic    Diameter (cm)Depth (cm)Findings +-----------------+-------------+----------+--------+ Shoulder             0.39                        +-----------------+-------------+----------+--------+ Prox upper arm       0.31                        +-----------------+-------------+----------+--------+ Mid upper arm        0.30                        +-----------------+-------------+----------+--------+ Dist upper arm       0.27                        +-----------------+-------------+----------+--------+ Antecubital fossa    0.37                        +-----------------+-------------+----------+--------+ Prox forearm         0.20                        +-----------------+-------------+----------+--------+ Mid forearm          0.24                        +-----------------+-------------+----------+--------+ Dist forearm         0.25                         +-----------------+-------------+----------+--------+ +-----------------+-------------+----------+--------+ Left Basilic     Diameter (cm)Depth (cm)Findings +-----------------+-------------+----------+--------+ Mid upper arm        0.47                        +-----------------+-------------+----------+--------+  Dist upper arm       0.23                        +-----------------+-------------+----------+--------+ Antecubital fossa    0.20                        +-----------------+-------------+----------+--------+ Prox forearm         0.17                        +-----------------+-------------+----------+--------+ Summary: Right: The Basilic and Cephalic Vein was mapped and No evidence of        clot seen. Left: The Basilic and Cephalic Vein was mapped and No evidence of       clot seen. *See table(s) above for measurements and observations.  Diagnosing physician: Hortencia Pilar MD Electronically signed by Hortencia Pilar MD on 12/18/2021 at 5:21:03 PM.    Final    VAS [place] UPPER EXTREMITY ARTERIAL DUPLEX  Result Date: 12/18/2021  UPPER EXTREMITY DUPLEX STUDY Patient Name:  Bradley Lopez  Date of Exam:   12/18/2021 Medical Rec #: [id number]    Accession #:    [id number] Date of Birth: 15-May-1975    Patient Gender: M Patient Age:   34 years Exam Location:  [facility] Procedure:      VAS [place] UPPER EXTREMITY ARTERIAL DUPLEX Referring Phys: Eulogio Ditch --------------------------------------------------------------------------------  Performing Technologist: Concha Norway RVT  Examination Guidelines: A complete evaluation includes B-mode imaging, spectral Doppler, color Doppler, and power Doppler as needed of all accessible portions of each vessel. Bilateral testing is considered an integral part of a complete examination. Limited examinations for reoccurring indications may be performed as noted.  Right Pre-Dialysis Findings:  +-----------------------+----------+--------------------+---------+--------+ Location               PSV (cm/s)Intralum. Diam. (cm)Waveform Comments +-----------------------+----------+--------------------+---------+--------+ Brachial Antecub. fossa76        0.49                triphasic         +-----------------------+----------+--------------------+---------+--------+ Radial Art at Wrist    94        0.27                triphasic         +-----------------------+----------+--------------------+---------+--------+ Ulnar Art at Wrist     111       0.25                triphasic         +-----------------------+----------+--------------------+---------+--------+  Left Pre-Dialysis Findings: +-----------------------+----------+--------------------+---------+--------+ Location               PSV (cm/s)Intralum. Diam. (cm)Waveform Comments +-----------------------+----------+--------------------+---------+--------+ Brachial Antecub. fossa125       0.47                triphasic         +-----------------------+----------+--------------------+---------+--------+ Radial Art at Wrist    111       0.26                triphasic         +-----------------------+----------+--------------------+---------+--------+ Ulnar Art at Wrist     103       0.20                triphasic         +-----------------------+----------+--------------------+---------+--------+  Summary:  Right: The Radial Artery, Ulnar Artery  and Brachial Artery displays        Triphasic Waveforms and was measured. Left: The Radial Artery, Ulnar Artery and Brachial Artery displays       Triphasic Waveforms and was measured. *See table(s) above for measurements and observations. Electronically signed by Hortencia Pilar MD on 12/18/2021 at 5:20:51 PM.    Final     Assessment and plan- Patient is a 46 y.o. male referred for anemia of chronic kidney disease  Patient has significant anemia presently with a hemoglobin  that has fluctuated between 7-8.  I am doing a complete anemia work-up today including CBC CMP ferritin and iron studies B12 folate TSH myeloma panel kappa lambda light chains haptoglobin reticulocyte count and LDH.  I will see him back in 2 weeks to discuss the results of blood work.  If his ferritin levels are less than 100 and or iron saturation less than 20% I will offer him IV iron.  Discussed risks and benefits of IV iron including all but not limited to possible risk of infusion reaction.  Patient understands and agrees to proceed as planned.   Thank you for this kind referral and the opportunity to participate in the care of this patient   Visit Diagnosis 1. Anemia of chronic kidney failure, stage 4 (severe) (HCC)     Dr. Randa Evens, MD, MPH [facility] [phone] 01/17/2022

## 2022-01-18 LAB — KAPPA/LAMBDA LIGHT CHAINS
Kappa free light chain: 230.2 mg/L — ABNORMAL HIGH (ref 3.3–19.4)
Kappa, lambda light chain ratio: 3.17 — ABNORMAL HIGH (ref 0.26–1.65)
Lambda free light chains: 72.7 mg/L — ABNORMAL HIGH (ref 5.7–26.3)

## 2022-01-18 LAB — HAPTOGLOBIN: Haptoglobin: 92 mg/dL (ref 23–355)

## 2022-01-22 LAB — MULTIPLE MYELOMA PANEL, SERUM
Albumin SerPl Elph-Mcnc: 3.6 g/dL (ref 2.9–4.4)
Albumin/Glob SerPl: 1.1 (ref 0.7–1.7)
Alpha 1: 0.4 g/dL (ref 0.0–0.4)
Alpha2 Glob SerPl Elph-Mcnc: 0.7 g/dL (ref 0.4–1.0)
B-Globulin SerPl Elph-Mcnc: 0.9 g/dL (ref 0.7–1.3)
Gamma Glob SerPl Elph-Mcnc: 1.5 g/dL (ref 0.4–1.8)
Globulin, Total: 3.4 g/dL (ref 2.2–3.9)
IgA: 138 mg/dL (ref 90–386)
IgG (Immunoglobin G), Serum: 1596 mg/dL (ref 603–1613)
IgM (Immunoglobulin M), Srm: 59 mg/dL (ref 20–172)
Total Protein ELP: 7 g/dL (ref 6.0–8.5)

## 2022-02-02 ENCOUNTER — Encounter: Payer: Self-pay | Admitting: Oncology

## 2022-02-02 ENCOUNTER — Inpatient Hospital Stay: Payer: BC Managed Care – PPO | Attending: Oncology | Admitting: Oncology

## 2022-02-02 VITALS — BP 147/70 | HR 66 | Resp 18 | Wt 188.8 lb

## 2022-02-02 DIAGNOSIS — N184 Chronic kidney disease, stage 4 (severe): Secondary | ICD-10-CM | POA: Diagnosis present

## 2022-02-02 DIAGNOSIS — E1122 Type 2 diabetes mellitus with diabetic chronic kidney disease: Secondary | ICD-10-CM | POA: Diagnosis not present

## 2022-02-02 DIAGNOSIS — D631 Anemia in chronic kidney disease: Secondary | ICD-10-CM | POA: Insufficient documentation

## 2022-02-04 ENCOUNTER — Encounter: Payer: Self-pay | Admitting: Oncology

## 2022-02-04 NOTE — Progress Notes (Signed)
Hematology/Oncology Consult note [facility]  [phone] [phone]  Patient Care Team: Baxter Hire, MD as PCP - General (Internal Medicine)   Name of the patient: Bradley Lopez  [id number]  May 30, 1975   Date of visit: 02/04/22  Diagnosis-anemia of chronic kidney disease  Chief complaint/ Reason for visit-discuss results of blood work  Heme/Onc history: Patient is a 46 year old male with a past medical history significant for poorly controlled type 2 diabetes, hypertension hyperlipidemia and stage V CKD, nephrotic syndrome who has been referred for Anemia.  Most recent CBC from 01/11/2022 showed white cell count of 10.2, H&H of 7.7/23.5 with an MCV of 89.4 and a platelet count of 159.  His hemoglobin was 9.9 in April 2022 and has been gradually drifting down since then.   Results of blood work from 01/17/2022 were as follows: H&H was 7.7/23 ferritin levels were normal at 223.  Myeloma panel showed no M protein.  Haptoglobin normal.  B12 TSH normal.  Both kappa and lambda free light chains were elevated with a free light chain ratio mildly abnormal at 3.1.  Interval history-patient wales lightheaded when he changes position.  He will be talking to his PCP and nephrology to adjust his blood pressure medications.  ECOG PS- 1 Pain scale- 0  Review of systems- Review of Systems  Constitutional:  Positive for malaise/fatigue. Negative for chills, fever and weight loss.  HENT:  Negative for congestion, ear discharge and nosebleeds.   Eyes:  Negative for blurred vision.  Respiratory:  Negative for cough, hemoptysis, sputum production, shortness of breath and wheezing.   Cardiovascular:  Negative for chest pain, palpitations, orthopnea and claudication.  Gastrointestinal:  Negative for abdominal pain, blood in stool, constipation, diarrhea, heartburn, melena, nausea and vomiting.  Genitourinary:  Negative for dysuria, flank pain, frequency,  hematuria and urgency.  Musculoskeletal:  Negative for back pain, joint pain and myalgias.  Skin:  Negative for rash.  Neurological:  Positive for dizziness. Negative for tingling, focal weakness, seizures, weakness and headaches.  Endo/Heme/Allergies:  Does not bruise/bleed easily.  Psychiatric/Behavioral:  Negative for depression and suicidal ideas. The patient does not have insomnia.       Allergies  Allergen Reactions   Pine Hives and Itching     Past Medical History:  Diagnosis Date   Allergy    Anemia    Asthma    Chronic kidney disease    Depression    situational   Diabetes mellitus without complication ([place])    Hypertension    Pneumonia      Past Surgical History:  Procedure Laterality Date   AV FISTULA PLACEMENT Left 01/03/2022   Procedure: ARTERIOVENOUS (AV) FISTULA CREATION (BRACHIALCEPHALIC);  Surgeon: Katha Cabal, MD;  Location: [facility];  Service: Vascular;  Laterality: Left;   COLONOSCOPY N/A 09/22/2021   Procedure: COLONOSCOPY;  Surgeon: Annamaria Helling, DO;  Location: [place];  Service: Gastroenterology;  Laterality: N/A;   EYE SURGERY Bilateral    detatch retina    Social History   Socioeconomic History   Marital status: Married    Spouse name: Malachy Mood   Number of children: Not on file   Years of education: Not on file   Highest education level: Not on file  Occupational History   Not on file  Tobacco Use   Smoking status: Never   Smokeless tobacco: Never  Vaping Use   Vaping Use: Never used  Substance and Sexual Activity   Alcohol use:  Never   Drug use: Never   Sexual activity: Yes  Other Topics Concern   Not on file  Social History Narrative   Not on file   Social Determinants of Health   Financial Resource Strain: Not on file  Food Insecurity: Not on file  Transportation Needs: Not on file  Physical Activity: Not on file  Stress: Not on file  Social Connections: Not on file  Intimate Partner Violence: Not  on file    Family History  Problem Relation Age of Onset   Coronary artery disease Mother    Hypertension Father    Prostate cancer Father      Current Outpatient Medications:    amLODipine (NORVASC) 10 MG tablet, Take 1 tablet (10 mg total) by mouth daily., Disp: 30 tablet, Rfl: 1   aspirin EC 81 MG EC tablet, Take 1 tablet (81 mg total) by mouth daily. Swallow whole., Disp: 30 tablet, Rfl: 11   atorvastatin (LIPITOR) 80 MG tablet, Take 1 tablet (80 mg total) by mouth daily., Disp: 30 tablet, Rfl: 1   calcitRIOL (ROCALTROL) 0.5 MCG capsule, Take 0.5 mcg by mouth daily., Disp: , Rfl:    cloNIDine (CATAPRES) 0.1 MG tablet, Take 0.1 mg by mouth 3 (three) times daily. 0.3 mg BID, Disp: , Rfl:    furosemide (LASIX) 40 MG tablet, Take 1 tablet (40 mg total) by mouth 2 (two) times daily., Disp: 30 tablet, Rfl: 0   glipiZIDE (GLUCOTROL XL) 5 MG 24 hr tablet, Take 5 mg by mouth daily., Disp: , Rfl:    insulin detemir (LEVEMIR) 100 UNIT/ML injection, Inject 20 Units into the skin daily after breakfast., Disp: , Rfl:    lisinopril (ZESTRIL) 20 MG tablet, Take 20 mg by mouth daily., Disp: , Rfl:    metolazone (ZAROXOLYN) 5 MG tablet, Take 5 mg by mouth daily., Disp: , Rfl:    metoprolol tartrate (LOPRESSOR) 50 MG tablet, Take 1 tablet (50 mg total) by mouth 2 (two) times daily., Disp: 60 tablet, Rfl: 0   mirtazapine (REMERON) 15 MG tablet, Take 15 mg by mouth at bedtime., Disp: , Rfl:    potassium chloride (KLOR-CON) 10 MEQ tablet, Take 10 mEq by mouth daily., Disp: , Rfl:    apixaban (ELIQUIS) 5 MG TABS tablet, Take 5 mg by mouth 2 (two) times daily. (Patient not taking: Reported on 09/21/2021), Disp: , Rfl:    clopidogrel (PLAVIX) 75 MG tablet, Take 1 tablet (75 mg total) by mouth daily. (Patient not taking: Reported on 02/02/2022), Disp: 30 tablet, Rfl: 0   hydrALAZINE (APRESOLINE) 50 MG tablet, Take 1 tablet (50 mg total) by mouth every 8 (eight) hours. (Patient not taking: Reported on 12/18/2021),  Disp: 90 tablet, Rfl: 1   HYDROcodone-acetaminophen (NORCO) 5-325 MG tablet, Take 1 tablet by mouth every 6 (six) hours as needed for moderate pain or severe pain. (Patient not taking: Reported on 01/17/2022), Disp: 30 tablet, Rfl: 0   insulin glargine (LANTUS) 100 UNIT/ML injection, Inject 10 Units into the skin daily. (Patient not taking: Reported on 12/18/2021), Disp: , Rfl:    temazepam (RESTORIL) 15 MG capsule, Take 15 mg by mouth at bedtime as needed for sleep. (Patient not taking: Reported on 01/17/2022), Disp: , Rfl:   Physical exam:  Vitals:   02/02/22 1355  BP: (!) 147/70  Pulse: 66  Resp: 18  SpO2: 99%  Weight: 188 lb 12.8 oz (85.6 kg)   Physical Exam Constitutional:      General: He is not  in acute distress. Cardiovascular:     Rate and Rhythm: Normal rate and regular rhythm.     Heart sounds: Normal heart sounds.  Pulmonary:     Effort: Pulmonary effort is normal.     Breath sounds: Normal breath sounds.  Abdominal:     General: Bowel sounds are normal.     Palpations: Abdomen is soft.  Skin:    General: Skin is warm and dry.  Neurological:     Mental Status: He is alert and oriented to person, place, and time.         Latest Ref Rng & Units 01/17/2022    2:18 PM  CMP  Glucose 70 - 99 mg/dL 197   BUN 6 - 20 mg/dL 93   Creatinine 0.61 - 1.24 mg/dL 8.76   Sodium 135 - 145 mmol/L 137   Potassium 3.5 - 5.1 mmol/L 5.1   Chloride 98 - 111 mmol/L 104   CO2 22 - 32 mmol/L 24   Calcium 8.9 - 10.3 mg/dL 8.8   Total Protein 6.5 - 8.1 g/dL 7.8   Total Bilirubin 0.3 - 1.2 mg/dL 0.3   Alkaline Phos 38 - 126 U/L 144   AST 15 - 41 U/L 23   ALT 0 - 44 U/L 15       Latest Ref Rng & Units 01/17/2022    2:18 PM  CBC  WBC 4.0 - 10.5 K/uL 10.0   Hemoglobin 13.0 - 17.0 g/dL 7.7   Hematocrit 39.0 - 52.0 % 23.0   Platelets 150 - 400 K/uL 259     Assessment and plan- Patient is a 46 y.o. male with anemia of chronic kidney disease here for discussion of blood work  results.  Peripheral blood anemia work-up does not reveal any reversible cause.  Iron numbers are within normal limits.  No evidence of B12 deficiency or hemolysis.  TSH is normal.  Myeloma panel showed no M protein.  Both kappa and lambda light chains are elevated likely secondary to CKD.  No evidence of plasma cell dyscrasia.  It appears that his anemia is likely secondary to his chronic kidney disease and benefit from EPO.  Patient is not currently on dialysis and until he starts on dialysis we will continue to give him Retacrit 40,000 units every 3 weeks with the goal to keep his hemoglobin between 10-11.  Discussed risks and benefits of Retacrit including all but not limited to possible risk of thromboembolism, heart attacks and strokes when hemoglobin is attempted to increase more than 12.  Patient and his father understand and willing to proceed as planned.  H&H and Retacrit every 3 weeks and I will see him back in 3 months with CBC ferritin and iron studies   Visit Diagnosis 1. Anemia of chronic kidney failure, stage 4 (severe) (HCC)      Dr. Randa Evens, MD, MPH [facility] [phone] 02/04/2022 5:37 PM

## 2022-02-09 ENCOUNTER — Inpatient Hospital Stay: Payer: BC Managed Care – PPO

## 2022-02-09 ENCOUNTER — Other Ambulatory Visit: Payer: Self-pay

## 2022-02-09 VITALS — BP 157/72 | HR 60

## 2022-02-09 DIAGNOSIS — N184 Chronic kidney disease, stage 4 (severe): Secondary | ICD-10-CM | POA: Diagnosis not present

## 2022-02-09 DIAGNOSIS — D631 Anemia in chronic kidney disease: Secondary | ICD-10-CM

## 2022-02-09 LAB — HEMOGLOBIN AND HEMATOCRIT, BLOOD
HCT: 26.3 % — ABNORMAL LOW (ref 39.0–52.0)
Hemoglobin: 8.4 g/dL — ABNORMAL LOW (ref 13.0–17.0)

## 2022-02-09 MED ORDER — EPOETIN ALFA-EPBX 40000 UNIT/ML IJ SOLN
40000.0000 [IU] | INTRAMUSCULAR | Status: DC
  Administered 2022-02-09: 40000 [IU] via SUBCUTANEOUS
  Filled 2022-02-09: qty 1

## 2022-02-19 ENCOUNTER — Other Ambulatory Visit: Payer: Self-pay

## 2022-02-19 ENCOUNTER — Encounter
Admission: EM | Disposition: A | Discharge status: Home / Self Care | Payer: Self-pay | Source: Home / Self Care | Attending: Internal Medicine

## 2022-02-19 ENCOUNTER — Inpatient Hospital Stay
Admission: EM | Admit: 2022-02-19 | Discharge: 2022-02-24 | DRG: 673 | Disposition: A | Discharge status: Home / Self Care | Payer: BC Managed Care – PPO | Attending: Internal Medicine | Admitting: Internal Medicine

## 2022-02-19 ENCOUNTER — Emergency Department: Payer: BC Managed Care – PPO

## 2022-02-19 DIAGNOSIS — R5383 Other fatigue: Principal | ICD-10-CM

## 2022-02-19 DIAGNOSIS — Z7984 Long term (current) use of oral hypoglycemic drugs: Secondary | ICD-10-CM | POA: Diagnosis not present

## 2022-02-19 DIAGNOSIS — E8809 Other disorders of plasma-protein metabolism, not elsewhere classified: Secondary | ICD-10-CM | POA: Diagnosis present

## 2022-02-19 DIAGNOSIS — Z8042 Family history of malignant neoplasm of prostate: Secondary | ICD-10-CM | POA: Diagnosis not present

## 2022-02-19 DIAGNOSIS — Z7982 Long term (current) use of aspirin: Secondary | ICD-10-CM

## 2022-02-19 DIAGNOSIS — I120 Hypertensive chronic kidney disease with stage 5 chronic kidney disease or end stage renal disease: Secondary | ICD-10-CM | POA: Diagnosis present

## 2022-02-19 DIAGNOSIS — Z8673 Personal history of transient ischemic attack (TIA), and cerebral infarction without residual deficits: Secondary | ICD-10-CM | POA: Diagnosis not present

## 2022-02-19 DIAGNOSIS — Z7901 Long term (current) use of anticoagulants: Secondary | ICD-10-CM | POA: Diagnosis not present

## 2022-02-19 DIAGNOSIS — N189 Chronic kidney disease, unspecified: Secondary | ICD-10-CM

## 2022-02-19 DIAGNOSIS — E1122 Type 2 diabetes mellitus with diabetic chronic kidney disease: Secondary | ICD-10-CM | POA: Diagnosis present

## 2022-02-19 DIAGNOSIS — Z794 Long term (current) use of insulin: Secondary | ICD-10-CM | POA: Diagnosis not present

## 2022-02-19 DIAGNOSIS — N186 End stage renal disease: Secondary | ICD-10-CM | POA: Diagnosis present

## 2022-02-19 DIAGNOSIS — Z95828 Presence of other vascular implants and grafts: Secondary | ICD-10-CM

## 2022-02-19 DIAGNOSIS — Z9889 Other specified postprocedural states: Secondary | ICD-10-CM

## 2022-02-19 DIAGNOSIS — E785 Hyperlipidemia, unspecified: Secondary | ICD-10-CM | POA: Diagnosis present

## 2022-02-19 DIAGNOSIS — I10 Essential (primary) hypertension: Secondary | ICD-10-CM | POA: Diagnosis not present

## 2022-02-19 DIAGNOSIS — F32A Depression, unspecified: Secondary | ICD-10-CM | POA: Diagnosis present

## 2022-02-19 DIAGNOSIS — R531 Weakness: Secondary | ICD-10-CM | POA: Diagnosis present

## 2022-02-19 DIAGNOSIS — Z8249 Family history of ischemic heart disease and other diseases of the circulatory system: Secondary | ICD-10-CM | POA: Diagnosis not present

## 2022-02-19 DIAGNOSIS — Z79899 Other long term (current) drug therapy: Secondary | ICD-10-CM | POA: Diagnosis not present

## 2022-02-19 DIAGNOSIS — N19 Unspecified kidney failure: Secondary | ICD-10-CM | POA: Diagnosis present

## 2022-02-19 DIAGNOSIS — E1121 Type 2 diabetes mellitus with diabetic nephropathy: Secondary | ICD-10-CM | POA: Diagnosis present

## 2022-02-19 DIAGNOSIS — Z992 Dependence on renal dialysis: Secondary | ICD-10-CM | POA: Diagnosis not present

## 2022-02-19 DIAGNOSIS — Z7902 Long term (current) use of antithrombotics/antiplatelets: Secondary | ICD-10-CM | POA: Diagnosis not present

## 2022-02-19 DIAGNOSIS — R936 Abnormal findings on diagnostic imaging of limbs: Secondary | ICD-10-CM | POA: Diagnosis not present

## 2022-02-19 DIAGNOSIS — Z1152 Encounter for screening for COVID-19: Secondary | ICD-10-CM

## 2022-02-19 DIAGNOSIS — N2581 Secondary hyperparathyroidism of renal origin: Secondary | ICD-10-CM | POA: Diagnosis present

## 2022-02-19 DIAGNOSIS — D631 Anemia in chronic kidney disease: Secondary | ICD-10-CM | POA: Diagnosis present

## 2022-02-19 DIAGNOSIS — N179 Acute kidney failure, unspecified: Secondary | ICD-10-CM | POA: Diagnosis present

## 2022-02-19 HISTORY — PX: TEMPORARY DIALYSIS CATHETER: CATH118312

## 2022-02-19 LAB — CBG MONITORING, ED
Glucose-Capillary: 196 mg/dL — ABNORMAL HIGH (ref 70–99)
Glucose-Capillary: 180 mg/dL — ABNORMAL HIGH (ref 70–99)
Glucose-Capillary: 136 mg/dL — ABNORMAL HIGH (ref 70–99)

## 2022-02-19 LAB — CBC
HCT: 24.5 % — ABNORMAL LOW (ref 39.0–52.0)
Hemoglobin: 8 g/dL — ABNORMAL LOW (ref 13.0–17.0)
MCH: 29.6 pg (ref 26.0–34.0)
MCHC: 32.7 g/dL (ref 30.0–36.0)
MCV: 90.7 fL (ref 80.0–100.0)
Platelets: 283 10*3/uL (ref 150–400)
RBC: 2.7 MIL/uL — ABNORMAL LOW (ref 4.22–5.81)
RDW: 14.1 % (ref 11.5–15.5)
WBC: 12.5 10*3/uL — ABNORMAL HIGH (ref 4.0–10.5)
nRBC: 0 % (ref 0.0–0.2)

## 2022-02-19 LAB — BASIC METABOLIC PANEL
Anion gap: 11 (ref 5–15)
BUN: 80 mg/dL — ABNORMAL HIGH (ref 6–20)
CO2: 23 mmol/L (ref 22–32)
Calcium: 9.1 mg/dL (ref 8.9–10.3)
Chloride: 105 mmol/L (ref 98–111)
Creatinine, Ser: 8.2 mg/dL — ABNORMAL HIGH (ref 0.61–1.24)
GFR, Estimated: 8 mL/min — ABNORMAL LOW (ref >60)
Glucose, Bld: 169 mg/dL — ABNORMAL HIGH (ref 70–99)
Potassium: 4.5 mmol/L (ref 3.5–5.1)
Sodium: 139 mmol/L (ref 135–145)

## 2022-02-19 LAB — RESP PANEL BY RT-PCR (FLU A&B, COVID) ARPGX2
Influenza A by PCR: NEGATIVE
Influenza B by PCR: NEGATIVE
SARS Coronavirus 2 by RT PCR: NEGATIVE

## 2022-02-19 LAB — BRAIN NATRIURETIC PEPTIDE: B Natriuretic Peptide: 657.8 pg/mL — ABNORMAL HIGH (ref 0.0–100.0)

## 2022-02-19 SURGERY — TEMPORARY DIALYSIS CATHETER
Anesthesia: LOCAL | Site: Groin | Laterality: Right

## 2022-02-19 MED ORDER — URSODIOL 300 MG PO CAPS
300.0000 mg | ORAL_CAPSULE | Freq: Two times a day (BID) | ORAL | Status: DC
  Administered 2022-02-19 – 2022-02-24 (×10): 300 mg via ORAL
  Filled 2022-02-19 (×11): qty 1

## 2022-02-19 MED ORDER — LIDOCAINE HCL (PF) 1 % IJ SOLN
INTRAMUSCULAR | Status: DC | PRN
  Administered 2022-02-19: 5 mL via INTRADERMAL

## 2022-02-19 MED ORDER — SODIUM CHLORIDE 0.9% FLUSH
3.0000 mL | Freq: Two times a day (BID) | INTRAVENOUS | Status: DC
  Administered 2022-02-20 – 2022-02-22 (×4): 3 mL via INTRAVENOUS

## 2022-02-19 MED ORDER — ACETAMINOPHEN 650 MG RE SUPP: 650.0000 mg | Freq: Four times a day (QID) | RECTAL | Status: DC | PRN

## 2022-02-19 MED ORDER — METOPROLOL TARTRATE 50 MG PO TABS
50.0000 mg | ORAL_TABLET | Freq: Two times a day (BID) | ORAL | Status: DC
  Administered 2022-02-19 – 2022-02-21 (×5): 50 mg via ORAL
  Filled 2022-02-19: qty 2
  Filled 2022-02-19 (×4): qty 1

## 2022-02-19 MED ORDER — METOLAZONE 5 MG PO TABS
5.0000 mg | ORAL_TABLET | Freq: Every day | ORAL | Status: DC
  Administered 2022-02-19 – 2022-02-24 (×6): 5 mg via ORAL
  Filled 2022-02-19 (×7): qty 1

## 2022-02-19 MED ORDER — CALCITRIOL 0.25 MCG PO CAPS
0.5000 ug | ORAL_CAPSULE | Freq: Every day | ORAL | Status: DC
  Administered 2022-02-20 – 2022-02-24 (×5): 0.5 ug via ORAL
  Filled 2022-02-19 (×6): qty 2

## 2022-02-19 MED ORDER — LISINOPRIL 20 MG PO TABS
20.0000 mg | ORAL_TABLET | Freq: Every day | ORAL | Status: DC
  Administered 2022-02-20: 20 mg via ORAL
  Filled 2022-02-19: qty 1

## 2022-02-19 MED ORDER — MIRTAZAPINE 15 MG PO TABS
15.0000 mg | ORAL_TABLET | Freq: Every day | ORAL | Status: DC
  Administered 2022-02-19 – 2022-02-23 (×5): 15 mg via ORAL
  Filled 2022-02-19 (×5): qty 1

## 2022-02-19 MED ORDER — AMLODIPINE BESYLATE 10 MG PO TABS
10.0000 mg | ORAL_TABLET | Freq: Every day | ORAL | Status: DC
  Administered 2022-02-19 – 2022-02-24 (×6): 10 mg via ORAL
  Filled 2022-02-19 (×5): qty 1
  Filled 2022-02-19: qty 2

## 2022-02-19 MED ORDER — ONDANSETRON HCL 4 MG PO TABS
4.0000 mg | ORAL_TABLET | Freq: Four times a day (QID) | ORAL | Status: DC | PRN
  Administered 2022-02-23: 4 mg via ORAL
  Filled 2022-02-19: qty 1

## 2022-02-19 MED ORDER — ONDANSETRON HCL 4 MG/2ML IJ SOLN
4.0000 mg | Freq: Four times a day (QID) | INTRAMUSCULAR | Status: DC | PRN
  Filled 2022-02-19: qty 2

## 2022-02-19 MED ORDER — INSULIN DETEMIR 100 UNIT/ML ~~LOC~~ SOLN
10.0000 [IU] | Freq: Every day | SUBCUTANEOUS | Status: DC
  Administered 2022-02-20 – 2022-02-24 (×5): 10 [IU] via SUBCUTANEOUS
  Filled 2022-02-19 (×5): qty 0.1

## 2022-02-19 MED ORDER — SODIUM CHLORIDE 0.9% FLUSH: 3.0000 mL | INTRAVENOUS | Status: DC | PRN

## 2022-02-19 MED ORDER — FUROSEMIDE 40 MG PO TABS
40.0000 mg | ORAL_TABLET | Freq: Two times a day (BID) | ORAL | Status: DC
  Administered 2022-02-20 – 2022-02-24 (×9): 40 mg via ORAL
  Filled 2022-02-19 (×11): qty 1

## 2022-02-19 MED ORDER — CLONIDINE HCL 0.1 MG PO TABS
0.3000 mg | ORAL_TABLET | Freq: Two times a day (BID) | ORAL | Status: DC
  Administered 2022-02-19 – 2022-02-24 (×10): 0.3 mg via ORAL
  Filled 2022-02-19 (×10): qty 3

## 2022-02-19 MED ORDER — ACETAMINOPHEN 325 MG PO TABS
650.0000 mg | ORAL_TABLET | Freq: Four times a day (QID) | ORAL | Status: DC | PRN
  Administered 2022-02-23: 650 mg via ORAL
  Filled 2022-02-19: qty 2

## 2022-02-19 MED ORDER — ATORVASTATIN CALCIUM 20 MG PO TABS
80.0000 mg | ORAL_TABLET | Freq: Every day | ORAL | Status: DC
  Administered 2022-02-20 – 2022-02-24 (×5): 80 mg via ORAL
  Filled 2022-02-19 (×5): qty 4

## 2022-02-19 MED ORDER — SODIUM CHLORIDE 0.9 % IV SOLN: 250.0000 mL | INTRAVENOUS | Status: DC | PRN

## 2022-02-19 SURGICAL SUPPLY — 2 items
KIT DIALYSIS CATH TRI 30X13 (CATHETERS) IMPLANT
PACK ANGIOGRAPHY (CUSTOM PROCEDURE TRAY) ×1 IMPLANT

## 2022-02-19 NOTE — H&P (Addendum)
History and Physical    Patient: Bradley Lopez HEN:[id number] DOB: [birth year] DOA: 02/19/2022 DOS: the patient was seen and examined on 02/19/2022 PCP: Baxter Hire, MD  Patient coming from: Home  Chief Complaint:  Chief Complaint  Patient presents with   Fatigue   HPI: Bradley Lopez is a 46 y.o. male with medical history significant for diabetes mellitus with complications of stage V CKD, anemia of chronic kidney disease, depression, hypertension who was sent to the emergency room by his nephrologist for evaluation of worsening renal function and the need for renal replacement therapy. Patient complains of 2 weeks of fatigue, generalized weakness, anorexia, dizziness and nausea.  He also has bilateral lower extremity swelling and periorbital edema.  He has been following at the hematology clinic and gets iron infusions and Retacrit for anemia of chronic kidney disease.  He continues to make urine and is on diuretics. He denies having any chest pain, abdominal pain, no emesis, no changes in his bowel habits, no hematuria, no frequency, no dysuria, no headache, no fever, no chills, no blurred vision no focal deficit. Patient noted to have worsening of his renal function over the past 1 year 1.96>> 6.13>>7.20>.8.76>>8.20 Patient had an AV graft placed in 09/23 He will be admitted to the hospital for evaluation of his worsening renal function and to initiate renal replacement therapy.   Review of Systems: As mentioned in the history of present illness. All other systems reviewed and are negative. Past Medical History:  Diagnosis Date   Allergy    Anemia    Asthma    Chronic kidney disease    Depression    situational   Diabetes mellitus without complication ([place])    Hypertension    Pneumonia    Past Surgical History:  Procedure Laterality Date   AV FISTULA PLACEMENT Left 01/03/2022   Procedure: ARTERIOVENOUS (AV) FISTULA CREATION (BRACHIALCEPHALIC);  Surgeon: Katha Cabal, MD;   Location: [facility];  Service: Vascular;  Laterality: Left;   COLONOSCOPY N/A 09/22/2021   Procedure: COLONOSCOPY;  Surgeon: Annamaria Helling, DO;  Location: [place];  Service: Gastroenterology;  Laterality: N/A;   EYE SURGERY Bilateral    detatch retina   Social History:  reports that he has never smoked. He has never used smokeless tobacco. He reports that he does not drink alcohol and does not use drugs.  Allergies  Allergen Reactions   Pine Hives and Itching    Family History  Problem Relation Age of Onset   Coronary artery disease Mother    Hypertension Father    Prostate cancer Father     Prior to Admission medications   Medication Sig Start Date End Date Taking? Authorizing Provider  amLODipine (NORVASC) 10 MG tablet Take 1 tablet (10 mg total) by mouth daily. 01/07/21  Yes Shawna Clamp, MD  aspirin EC 81 MG EC tablet Take 1 tablet (81 mg total) by mouth daily. Swallow whole. 01/08/21  Yes Shawna Clamp, MD  atorvastatin (LIPITOR) 80 MG tablet Take 1 tablet (80 mg total) by mouth daily. 01/08/21  Yes Shawna Clamp, MD  calcitRIOL (ROCALTROL) 0.5 MCG capsule Take 0.5 mcg by mouth daily. 01/10/22  Yes [provider]  cloNIDine (CATAPRES) 0.1 MG tablet Take 0.1 mg by mouth 3 (three) times daily. 0.3 mg BID 12/15/20  Yes [provider]  furosemide (LASIX) 40 MG tablet Take 1 tablet (40 mg total) by mouth 2 (two) times daily. 06/17/20  Yes Sharen Hones, MD  glipiZIDE (GLUCOTROL XL) 5  MG 24 hr tablet Take 5 mg by mouth daily. 08/20/20  Yes [provider]  insulin detemir (LEVEMIR) 100 UNIT/ML injection Inject 20 Units into the skin daily after breakfast.   Yes [provider]  lisinopril (ZESTRIL) 20 MG tablet Take 20 mg by mouth daily.   Yes [provider]  metolazone (ZAROXOLYN) 5 MG tablet Take 5 mg by mouth daily.   Yes [provider]  mirtazapine (REMERON) 15 MG tablet Take 15 mg by mouth at bedtime.   Yes  [provider]  prednisoLONE acetate (PRED FORTE) 1 % ophthalmic suspension SMARTSIG:In Eye(s) 01/31/22  Yes [provider]  sevelamer carbonate (RENVELA) 800 MG tablet Take by mouth. 01/19/22  Yes [provider]  ursodiol (ACTIGALL) 300 MG capsule Take by mouth. 01/17/22  Yes [provider]  apixaban (ELIQUIS) 5 MG TABS tablet Take 5 mg by mouth 2 (two) times daily. Patient not taking: Reported on 09/21/2021    [provider]  atorvastatin (LIPITOR) 40 MG tablet Take 40 mg by mouth daily. Patient not taking: Reported on 02/19/2022 02/02/22   [provider]  clopidogrel (PLAVIX) 75 MG tablet Take 1 tablet (75 mg total) by mouth daily. Patient not taking: Reported on 02/02/2022 01/08/21   Shawna Clamp, MD  hydrALAZINE (APRESOLINE) 50 MG tablet Take 1 tablet (50 mg total) by mouth every 8 (eight) hours. Patient not taking: Reported on 12/18/2021 01/07/21   Shawna Clamp, MD  HYDROcodone-acetaminophen (NORCO) 5-325 MG tablet Take 1 tablet by mouth every 6 (six) hours as needed for moderate pain or severe pain. Patient not taking: Reported on 01/17/2022 01/03/22   Schnier, Dolores Lory, MD  insulin glargine (LANTUS) 100 UNIT/ML injection Inject 10 Units into the skin daily. Patient not taking: Reported on 12/18/2021    [provider]  metoprolol tartrate (LOPRESSOR) 50 MG tablet Take 1 tablet (50 mg total) by mouth 2 (two) times daily. 06/17/20   Sharen Hones, MD  moxifloxacin (VIGAMOX) 0.5 % ophthalmic solution Apply to eye. Patient not taking: Reported on 02/19/2022 01/31/22   [provider]  neomycin-polymyxin b-dexamethasone (MAXITROL) 3.5-10000-0.1 OINT  01/31/22   [provider]  potassium chloride (KLOR-CON) 10 MEQ tablet Take 10 mEq by mouth daily. Patient not taking: Reported on 02/19/2022 01/16/22   [provider]  temazepam (RESTORIL) 15 MG capsule Take 15 mg by mouth at bedtime as needed for  sleep. Patient not taking: Reported on 01/17/2022 09/15/20   [provider]    Physical Exam: Vitals:   02/19/22 3295 02/19/22 0834 02/19/22 1030  BP:  135/74   Pulse:  (!) 59   Resp:  18   Temp:  98 F (36.7 C)   TempSrc:  Oral   SpO2:  100%   Weight:   85.6 kg  Height: 6\' 3"  (1.905 m)  6\' 3"  (1.905 m)   Physical Exam Vitals and nursing note reviewed.  Constitutional:      Comments: Chronically ill-appearing  HENT:     Head: Normocephalic and atraumatic.     Nose: Nose normal.     Mouth/Throat:     Mouth: Mucous membranes are moist.  Eyes:     Comments: Pale conjunctiva  Cardiovascular:     Rate and Rhythm: Normal rate and regular rhythm.  Pulmonary:     Effort: Pulmonary effort is normal.     Breath sounds: Normal breath sounds.  Abdominal:     General: Abdomen is flat. Bowel sounds are normal.  Palpations: Abdomen is soft.  Musculoskeletal:     Cervical back: Normal range of motion and neck supple.     Right lower leg: Edema present.     Left lower leg: Edema present.  Skin:    General: Skin is warm and dry.  Neurological:     Mental Status: He is alert.     Motor: Weakness present.  Psychiatric:     Comments: Depressed mood, flat affect     Data Reviewed: Data Reviewed: Relevant notes from primary care and specialist visits, past discharge summaries as available in EHR, including Care Everywhere. Prior diagnostic testing as pertinent to current admission diagnoses Updated medications and problem lists for reconciliation ED course, including vitals, labs, imaging, treatment and response to treatment Triage notes, nursing and pharmacy notes and ED provider's notes Notable results as noted in HPI Labs reviewed.  BNP 657, sodium 139, potassium 4.5, chloride 105, bicarb 23, glucose 169, BUN 80, creatinine 8.20, calcium 9.1, white count 12.5, hemoglobin 8.0, hematocrit 34.5, platelet count 283 Respiratory viral panel is negative X-ray reviewed by  me shows no evidence of acute cardiopulmonary disease Twelve-lead EKG reviewed by me shows normal sinus rhythm There are no new results to review at this time.  Assessment and Plan: * Generalized weakness Most likely secondary to worsening renal function with some uremia. 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 Consult nephrology Vascular surgery consult for PermCath insertion We will continue long-acting insulin but decrease dose to 10 units daily since patient has anorexia and poor oral intake Hold glipizide Check blood sugars with meals  Depression Continue mirtazapine  Anemia due to chronic kidney disease Hemoglobin is 8 and appears to be patient's baseline Follow-up with hematology as an outpatient for iron infusions  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 ([place]) Patient with massive proteinuria and hypoalbuminemia leading to anasarca. Continue diuretics with furosemide and metolazone Continue atorvastatin  Essential hypertension Patient is on multiple antihypertensive medications which include lisinopril, amlodipine, metoprolol and clonidine. Monitor patient closely for bradycardia since he is on metoprolol and clonidine      Advance Care Planning:   Code Status: Full Code   Consults: Nephrology, vascular surgery  Family Communication: Greater than 50% of time was spent discussing plan of care with patient at the bedside.  All questions and concerns have been addressed.  He verbalizes understanding and agrees with the plan.  Severity of Illness: The appropriate patient status for this patient is  INPATIENT. Inpatient status is judged to be reasonable and necessary in order to provide the required intensity of service to ensure the patient's safety. The patient's presenting symptoms, physical exam findings, and initial radiographic and laboratory data in the context of their chronic comorbidities is felt to place them at high risk for further clinical deterioration. Furthermore, it is not anticipated that the patient will be medically stable for discharge from the hospital within 2 midnights of admission.   * I certify that at the point of admission it is my clinical judgment that the patient will require inpatient hospital care spanning beyond 2 midnights from the point of admission due to high intensity of service, high risk for further deterioration and high frequency of surveillance required.*  Author: Collier Bullock, MD 02/19/2022 12:31 PM  For on call review [email or web address].

## 2022-02-19 NOTE — Op Note (Signed)
  OPERATIVE NOTE   PROCEDURE: Ultrasound guidance for vascular access right femoral vein Placement of a 30 cm triple lumen dialysis catheter right fmeoral vein  PRE-OPERATIVE DIAGNOSIS: 1. Acute renal failure on top of CKD with AVF not ready to be used  POST-OPERATIVE DIAGNOSIS: Same  SURGEON: [name], MD  ASSISTANT(S): None  ANESTHESIA: local  ESTIMATED BLOOD LOSS: Minimal   FINDING(S): 1.  None  SPECIMEN(S):  None  INDICATIONS:    Patient is a 46 y.o.male who presents with renal failure and a fistula not quite ready to use.  He needs a catheter for immediate dialysis.  Risks and benefits were discussed, and informed consent was obtained.  DESCRIPTION: After obtaining full informed written consent, the patient was laid flat in the bed.  The right groin was sterilely prepped and draped in a sterile surgical field was created. The right femoral vein was visualized with ultrasound and found to be widely patent. It was then accessed under direct guidance without difficulty with a Seldinger needle and a permanent image was recorded. A J-wire was then placed. After skin nick and dilatation, a 30 cm triple lumen dialysis catheter was placed over the wire and the wire was removed. The lumens withdrew dark red nonpulsatile blood and flushed easily with sterile saline. The catheter was secured to the skin with 3 nylon sutures. Sterile dressing was placed.  COMPLICATIONS: None  CONDITION: Stable  [name] 02/19/2022 6:46 PM  This note was created with Dragon Medical transcription system. Any errors in dictation are purely unintentional.

## 2022-02-19 NOTE — Progress Notes (Signed)
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  Patient presents to the emergency department with complaints of weakness and fatigue.  Patient will be admitted for Generalized weakness [R53.1]  Patient is known to our practice and is followed outpatient by Dr. Lanora Manis.  Patient was last seen in the office on February 14, 2022 where it was discussed with patient his advanced renal failure and the need to proceed with dialysis.  Patient states he knew dialysis was in his future however remains nervous to fully initiate.  Patient did have HD access, fistula, placed in early September.  Access continues to mature.  Patient reports fluctuating appetite and denies food distaste.  States he does have fatigue, and daytime drowsiness.  Reports shortness of breath with exertion, stairs.  Labs on ED arrival include glucose 169, BUN 80, creatinine 8.20 with GFR 8, BNP 657.8, elevated WBCs 12.5 with hemoglobin 8.0.  Negative respiratory panel.  Chest x-ray negative for acute findings.  We have been consulted to evaluate advanced kidney disease and initiate dialysis.   Objective:  Vital signs in last 24 hours:  Temp:  [98 F (36.7 C)] 98 F (36.7 C) (10/30 1315) Pulse Rate:  [59-66] 66 (10/30 1315) Resp:  [18] 18 (10/30 0834) BP: (135-164)/(74-79) 164/79 (10/30 1315) SpO2:  [100 %] 100 % (10/30 1315) Weight:  [85.6 kg] 85.6 kg (10/30 1030)  Weight change:  Filed Weights   02/19/22 1030  Weight: 85.6 kg    Intake/Output: No intake/output data recorded.   Intake/Output this shift:  No intake/output data recorded.  Physical Exam: General: NAD, resting comfortably  Head: Normocephalic, atraumatic. Moist oral mucosal membranes  Eyes: Anicteric  Lungs:  Clear to auscultation,  normal effort, room air  Heart: Regular rate and rhythm  Abdomen:  Soft, nontender, nondistended  Extremities: No peripheral edema.  Neurologic: Nonfocal, moving all four extremities  Skin: No lesions  Access: Left upper aVF (maturing)    Basic Metabolic Panel: Recent Labs  Lab 02/19/22 0844  NA 139  K 4.5  CL 105  CO2 23  GLUCOSE 169*  BUN 80*  CREATININE 8.20*  CALCIUM 9.1    Liver Function Tests: No results for input(s): "AST", "ALT", "ALKPHOS", "BILITOT", "PROT", "ALBUMIN" in the last 168 hours. No results for input(s): "LIPASE", "AMYLASE" in the last 168 hours. No results for input(s): "AMMONIA" in the last 168 hours.  CBC: Recent Labs  Lab 02/19/22 0844  WBC 12.5*  HGB 8.0*  HCT 24.5*  MCV 90.7  PLT 283    Cardiac Enzymes: No results for input(s): "CKTOTAL", "CKMB", "CKMBINDEX", "TROPONINI" in the last 168 hours.  BNP: Invalid input(s): "POCBNP"  CBG: Recent Labs  Lab 02/19/22 0842 02/19/22 1314  GLUCAP 196* 180*    Microbiology: Results for orders placed or performed during the hospital encounter of 02/19/22  Resp Panel by RT-PCR (Flu A&B, Covid) Anterior Nasal Swab     Status: None   Collection Time: 02/19/22 10:21 AM   Specimen: Anterior Nasal Swab  Result Value Ref Range Status   SARS Coronavirus 2 by RT PCR NEGATIVE NEGATIVE Final    Comment: (NOTE) SARS-CoV-2 target nucleic acids are NOT DETECTED.  The SARS-CoV-2 RNA is generally detectable in upper respiratory specimens during the acute phase of infection. The lowest concentration of SARS-CoV-2 viral copies this assay  can detect is 138 copies/mL. A negative result does not preclude SARS-Cov-2 infection and should not be used as the sole basis for treatment or other patient management decisions. A negative result may occur with  improper specimen collection/handling, submission of specimen other than nasopharyngeal swab, presence of viral mutation(s) within the areas targeted by this  assay, and inadequate number of viral copies(<138 copies/mL). A negative result must be combined with clinical observations, patient history, and epidemiological information. The expected result is Negative.  Fact Sheet for Patients:  EntrepreneurPulse.com.au  Fact Sheet for Healthcare Providers:  IncredibleEmployment.be  This test is no t yet approved or cleared by the [place] FDA and  has been authorized for detection and/or diagnosis of SARS-CoV-2 by FDA under an Emergency Use Authorization (EUA). This EUA will remain  in effect (meaning this test can be used) for the duration of the COVID-19 declaration under Section 564(b)(1) of the Act, 21 U.S.C.section 360bbb-3(b)(1), unless the authorization is terminated  or revoked sooner.       Influenza A by PCR NEGATIVE NEGATIVE Final   Influenza B by PCR NEGATIVE NEGATIVE Final    Comment: (NOTE) The Xpert Xpress SARS-CoV-2/FLU/RSV plus assay is intended as an aid in the diagnosis of influenza from Nasopharyngeal swab specimens and should not be used as a sole basis for treatment. Nasal washings and aspirates are unacceptable for Xpert Xpress SARS-CoV-2/FLU/RSV testing.  Fact Sheet for Patients: EntrepreneurPulse.com.au  Fact Sheet for Healthcare Providers: IncredibleEmployment.be  This test is not yet approved or cleared by the [place] FDA and has been authorized for detection and/or diagnosis of SARS-CoV-2 by FDA under an Emergency Use Authorization (EUA). This EUA will remain in effect (meaning this test can be used) for the duration of the COVID-19 declaration under Section 564(b)(1) of the Act, 21 U.S.C. section 360bbb-3(b)(1), unless the authorization is terminated or revoked.  Performed at [facility], [address] [place], [place] [address]     Coagulation Studies: No results for input(s): "LABPROT", "INR" in the last  72 hours.  Urinalysis: No results for input(s): "COLORURINE", "LABSPEC", "PHURINE", "GLUCOSEU", "HGBUR", "BILIRUBINUR", "KETONESUR", "PROTEINUR", "UROBILINOGEN", "NITRITE", "LEUKOCYTESUR" in the last 72 hours.  Invalid input(s): "APPERANCEUR"    Imaging: DG Chest 1 View  Result Date: 02/19/2022 CLINICAL DATA:  Shortness of breath, dizziness EXAM: CHEST  1 VIEW COMPARISON:  06/10/2020 FINDINGS: The heart size and mediastinal contours are within normal limits. Both lungs are clear. The visualized skeletal structures are unremarkable. IMPRESSION: No active disease. Electronically Signed   By: Elmer Picker M.D.   On: 02/19/2022 09:28     Medications:    sodium chloride      amLODipine  10 mg Oral Daily   [START ON 02/20/2022] atorvastatin  80 mg Oral Daily   [START ON 02/20/2022] calcitRIOL  0.5 mcg Oral Daily   cloNIDine  0.3 mg Oral BID   furosemide  40 mg Oral BID   [START ON 02/20/2022] insulin detemir  10 Units Subcutaneous Daily   [START ON 02/20/2022] lisinopril  20 mg Oral Daily   metolazone  5 mg Oral Daily   metoprolol tartrate  50 mg Oral BID   mirtazapine  15 mg Oral QHS   sodium chloride flush  3 mL Intravenous Q12H   ursodiol  300 mg Oral BID   sodium chloride, acetaminophen **OR** acetaminophen, ondansetron **OR** ondansetron (ZOFRAN) IV, sodium chloride flush  Assessment/ Plan:  Mr. Bradley Lopez is a 46 y.o.  male with past medical  conditions including anemia, hypertension, depression, diabetes, right retinal detachment with nuclear sclerotic cataract and chronic kidney disease stage V.  Patient presents to the emergency department with complaints of weakness and fatigue.  Patient will be admitted for Generalized weakness [R53.1]   Chronic kidney disease stage V.  BUN on admission 80.  Uremic symptoms present.  AV fistula placed in September.  Due to fistula maturing, have consulted vascular for placement of PermCath to initiate dialysis.  Once placed tomorrow,  patient will receive initial dialysis treatments, 2 hours.  Renal navigator aware of patient and will seek outpatient dialysis clinic.  2. Anemia of chronic kidney disease Lab Results  Component Value Date   HGB 8.0 (L) 02/19/2022    Hemoglobin below desired target.  We will consider ESA's once dialysis initiated.  3. Secondary Hyperparathyroidism: with outpatient labs: PTH 236, phosphorus 6.2, calcium 8.7 on October 11, 2021.   Lab Results  Component Value Date   PTH 54 06/11/2020   CALCIUM 9.1 02/19/2022   CAION 1.14 (L) 01/03/2022    Calcium remains within desired target.  We will continue to monitor bone minerals.  4. 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 Home regimen includes glipizide, Lantus and Levemir. Most recent hemoglobin A1c is 7.7 on 02/02/2022.     LOS: 0 Yobana Culliton 10/30/20232:17 PM

## 2022-02-19 NOTE — Assessment & Plan Note (Addendum)
Patient has diabetes mellitus with complications of stage V chronic kidney disease secondary to diabetic nephropathy with complications of hypertensive nephrosclerosis and presents for evaluation of worsening renal function with plans to start renal replacement therapy. Consult nephrology Vascular surgery consult for PermCath insertion We will continue long-acting insulin but decrease dose to 10 units daily since patient has anorexia and poor oral intake Hold glipizide Check blood sugars with meals

## 2022-02-19 NOTE — ED Notes (Signed)
See triage note  Presents with family States he has been weaker and more fatigued   States recently dx'd with renal disease  Getting ready for dialysis   Has new fistula to left arm

## 2022-02-19 NOTE — ED Provider Notes (Signed)
[facility] Provider Note    Event Date/Time   First MD Initiated Contact with Patient 02/19/22 [phone]     (approximate)   History   Chief Complaint Fatigue   HPI Bradley Lopez is a 46 y.o. male, history of hypertension, DVT, GAD, hyperlipidemia, diabetes, CKD stage IV, type 2 diabetes, insomnia, TIA, presents to the emergency department for evaluation of fatigue.  Patient states that his kidney function has been progressively worsening over the past year, reportedly secondary to uncontrolled diabetes.  He has been feeling persistently fatigued, sleepy, has had intermittent episodes of edema in his lower extremities. He was seen by his nephrologist on 02/14/2022, who noted a creatinine of 8.5 with GFR of 7 cc/min.  He recommended that the patient go to the emergency room to start dialysis.  He denies any new symptoms.  Denies fever/chills, chest pain, shortness of breath, abdominal pain, flank pain, nausea/vomiting, diarrhea, dysuria, headache,/tingling in upper or lower extremities, or dizziness/lightheadedness.  His nephrologist is Dr. Lyla Son  History Limitations: No limitations.        Physical Exam  Triage Vital Signs: ED Triage Vitals  Enc Vitals Group     BP 02/19/22 0834 135/74     Pulse Rate 02/19/22 0834 (!) 59     Resp 02/19/22 0834 18     Temp 02/19/22 0834 98 F (36.7 C)     Temp Source 02/19/22 0834 Oral     SpO2 02/19/22 0834 100 %     Weight --      Height 02/19/22 0833 6\' 3"  (1.905 m)     Head Circumference --      Peak Flow --      Pain Score 02/19/22 0833 0     Pain Loc --      Pain Edu? --      Excl. in [place]? --     Most recent vital signs: Vitals:   02/19/22 0834  BP: 135/74  Pulse: (!) 59  Resp: 18  Temp: 98 F (36.7 C)  SpO2: 100%    General: Awake, NAD.  Skin: Warm, dry. No rashes or lesions.  Eyes: PERRL. Conjunctivae normal.  CV: Good peripheral perfusion.  Resp: Normal effort.  Lung sounds are clear  bilaterally in the apices and bases. Abd: Soft, non-tender. No distention.  Neuro: At baseline. No gross neurological deficits.  Musculoskeletal: Normal ROM of all extremities.  2+ pitting edema in his lower extremities.  Physical Exam    ED Results / Procedures / Treatments  Labs (all labs ordered are listed, but only abnormal results are displayed) Labs Reviewed  BASIC METABOLIC PANEL - Abnormal; Notable for the following components:      Result Value   Glucose, Bld 169 (*)    BUN 80 (*)    Creatinine, Ser 8.20 (*)    GFR, Estimated 8 (*)    All other components within normal limits  CBC - Abnormal; Notable for the following components:   WBC 12.5 (*)    RBC 2.70 (*)    Hemoglobin 8.0 (*)    HCT 24.5 (*)    All other components within normal limits  CBG MONITORING, ED - Abnormal; Notable for the following components:   Glucose-Capillary 196 (*)    All other components within normal limits  RESP PANEL BY RT-PCR (FLU A&B, COVID) [id number]  URINALYSIS, ROUTINE W REFLEX MICROSCOPIC  BRAIN NATRIURETIC PEPTIDE     EKG Sinus rhythm, rate of 61, no T-segment  changes, normal QRS, no QT prolongation, no AV blocks.    RADIOLOGY  ED Provider Interpretation: I personally viewed and interpreted this x-ray, no evidence of acute cardiopulmonary disease.  DG Chest 1 View  Result Date: 02/19/2022 CLINICAL DATA:  Shortness of breath, dizziness EXAM: CHEST  1 VIEW COMPARISON:  06/10/2020 FINDINGS: The heart size and mediastinal contours are within normal limits. Both lungs are clear. The visualized skeletal structures are unremarkable. IMPRESSION: No active disease. Electronically Signed   By: Elmer Picker M.D.   On: 02/19/2022 09:28    PROCEDURES:  Critical Care performed: N/A.  Procedures    MEDICATIONS ORDERED IN ED: Medications - No data to display   IMPRESSION / MDM / [place] / ED COURSE  I reviewed the triage vital signs and the nursing notes.                               Differential diagnosis includes, but is not limited to, AKI, renal failure, anemia, COVID-19, heart failure, commune acquired pneumonia, diabetes.  ED Course Patient appears well, vitals within normal limits.  NAD.  CBC shows leukocytosis at 12.5, anemia with hemoglobin of 8.0, consistent with previous values.  Normal platelets.  BMP shows elevated creatinine at 8.2, similar to last value 1 month prior at 8.7, but progressively elevated from 1.16 over the past year. BUN elevated at 80   Assessment/Plan Patient presents with worsening fatigue, sleepiness, and dizziness has progressively worsened over the past few months.  Denies any new or recent symptoms.  He was evaluated by his nephrologist, Dr.Korrapatti 5 days ago, who noted worsening kidney function with creatinine of 8.7, likely secondary to uncontrolled diabetes.  He had a AV fistula that was preemptively placed back in September.  He was advised by his nephrologist to report to the emergency department to initiate dialysis.  Lab work-up confirms worsening kidney function.  Spoke with the on-call nephrologist, Dr. Holley Raring, who advised admitting to medicine and would be seeing the patient shortly to coordinate dialysis.  Spoke to the on-call hospitalist, who accepted admission.  Patient's presentation is most consistent with acute illness / injury with system symptoms.       FINAL CLINICAL IMPRESSION(S) / ED DIAGNOSES   Final diagnoses:  Other fatigue  Chronic kidney disease, unspecified CKD stage     Rx / DC Orders   ED Discharge Orders     None        Note:  This document was prepared using Dragon voice recognition software and may include unintentional dictation errors.   Teodoro Spray, [place] 02/19/22 1054    Harvest Dark, MD 02/19/22 [phone]

## 2022-02-19 NOTE — Assessment & Plan Note (Addendum)
Patient is on multiple antihypertensive medications which include lisinopril, amlodipine, metoprolol and clonidine. Monitor patient closely for bradycardia since he is on metoprolol and clonidine.  At times, patient is spiking blood pressure and attempted to treat with hydralazine.

## 2022-02-19 NOTE — Assessment & Plan Note (Signed)
-   Continue mirtazapine 

## 2022-02-19 NOTE — ED Triage Notes (Addendum)
Pt accompanied by dad, c/o fatigue, dizziness x 2 months progressively worsening. Per pt, he has kidney failure and his doctor told him as well because "labs are trending that way". Not on dialysis but has left arm fistula "just in case". Reports intermittent chest pain/ SOB with exacerbation-- denies pain now. Pt reports feeling depressed and sees psychiatrist but denies suicidal ideation.

## 2022-02-19 NOTE — Assessment & Plan Note (Signed)
Hemoglobin is 8 and appears to be patient's baseline Follow-up with hematology as an outpatient for iron infusions

## 2022-02-19 NOTE — Assessment & Plan Note (Addendum)
Patient with massive proteinuria and hypoalbuminemia leading to anasarca. Continue diuretics with furosemide and metolazone Continue atorvastatin

## 2022-02-19 NOTE — Consult Note (Signed)
[facility] Lopez [facility] Consult Note  MRN : [id number]  Bradley Lopez is a 46 y.o. (1976-02-10) male who presents with chief complaint of  Chief Complaint  Patient presents with   Fatigue  .   Consulting Physician: Colon Flattery, NP Reason for consult: End-stage renal disease History of Present Illness: Bradley Lopez is a 46 year old male with a previous medical history significant for diabetes, hypertension, depression, chronic kidney disease stage V that presented to [facility] due to worsening renal function and the need for initiation of dialysis.  For the last 2 weeks the patient's been fatigued, weak, anorexic with complaints of dizziness and nausea.  He is also had worsening lower extremity edema and periorbital edema.  The patient previously had a left brachiocephalic AV fistula on 06/21/6008.  He has not had any difficulties post placement.  The patient was initially scheduled in our office for outpatient follow-up to evaluate the maturity however his symptoms worsen prior to follow-up.  Current Facility-Administered Medications  Medication Dose Route Frequency Provider Last Rate Last Admin   0.9 %  sodium chloride infusion  250 mL Intravenous PRN Agbata, Tochukwu, MD       acetaminophen (TYLENOL) tablet 650 mg  650 mg Oral Q6H PRN Agbata, Tochukwu, MD       Or   acetaminophen (TYLENOL) suppository 650 mg  650 mg Rectal Q6H PRN Agbata, Tochukwu, MD       amLODipine (NORVASC) tablet 10 mg  10 mg Oral Daily Agbata, Tochukwu, MD   10 mg at 02/19/22 1349   [START ON 02/20/2022] atorvastatin (LIPITOR) tablet 80 mg  80 mg Oral Daily Agbata, Tochukwu, MD       [START ON 02/20/2022] calcitRIOL (ROCALTROL) capsule 0.5 mcg  0.5 mcg Oral Daily Agbata, Tochukwu, MD       cloNIDine (CATAPRES) tablet 0.3 mg  0.3 mg Oral BID Agbata, Tochukwu, MD       furosemide (LASIX) tablet 40 mg  40 mg Oral BID Agbata, Tochukwu, MD       [START ON 02/20/2022] insulin  detemir (LEVEMIR) injection 10 Units  10 Units Subcutaneous Daily Agbata, Tochukwu, MD       [START ON 02/20/2022] lisinopril (ZESTRIL) tablet 20 mg  20 mg Oral Daily Agbata, Tochukwu, MD       metolazone (ZAROXOLYN) tablet 5 mg  5 mg Oral Daily Agbata, Tochukwu, MD   5 mg at 02/19/22 1349   metoprolol tartrate (LOPRESSOR) tablet 50 mg  50 mg Oral BID Agbata, Tochukwu, MD       mirtazapine (REMERON) tablet 15 mg  15 mg Oral QHS Agbata, Tochukwu, MD       ondansetron (ZOFRAN) tablet 4 mg  4 mg Oral Q6H PRN Agbata, Tochukwu, MD       Or   ondansetron (ZOFRAN) injection 4 mg  4 mg Intravenous Q6H PRN Agbata, Tochukwu, MD       sodium chloride flush (NS) 0.9 % injection 3 mL  3 mL Intravenous Q12H Agbata, Tochukwu, MD       sodium chloride flush (NS) 0.9 % injection 3 mL  3 mL Intravenous PRN Agbata, Tochukwu, MD       ursodiol (ACTIGALL) capsule 300 mg  300 mg Oral BID Agbata, Tochukwu, MD       Current Outpatient Medications  Medication Sig Dispense Refill   amLODipine (NORVASC) 10 MG tablet Take 1 tablet (10 mg total) by mouth daily. 30 tablet 1   aspirin EC 81  MG EC tablet Take 1 tablet (81 mg total) by mouth daily. Swallow whole. 30 tablet 11   atorvastatin (LIPITOR) 80 MG tablet Take 1 tablet (80 mg total) by mouth daily. 30 tablet 1   calcitRIOL (ROCALTROL) 0.5 MCG capsule Take 0.5 mcg by mouth daily.     cloNIDine (CATAPRES) 0.1 MG tablet Take 0.1 mg by mouth 3 (three) times daily. 0.3 mg BID     furosemide (LASIX) 40 MG tablet Take 1 tablet (40 mg total) by mouth 2 (two) times daily. 30 tablet 0   glipiZIDE (GLUCOTROL XL) 5 MG 24 hr tablet Take 5 mg by mouth daily.     insulin detemir (LEVEMIR) 100 UNIT/ML injection Inject 20 Units into the skin daily after breakfast.     lisinopril (ZESTRIL) 20 MG tablet Take 20 mg by mouth daily.     metolazone (ZAROXOLYN) 5 MG tablet Take 5 mg by mouth daily.     mirtazapine (REMERON) 15 MG tablet Take 15 mg by mouth at bedtime.     prednisoLONE  acetate (PRED FORTE) 1 % ophthalmic suspension SMARTSIG:In Eye(s)     sevelamer carbonate (RENVELA) 800 MG tablet Take by mouth.     ursodiol (ACTIGALL) 300 MG capsule Take by mouth.     apixaban (ELIQUIS) 5 MG TABS tablet Take 5 mg by mouth 2 (two) times daily. (Patient not taking: Reported on 09/21/2021)     atorvastatin (LIPITOR) 40 MG tablet Take 40 mg by mouth daily. (Patient not taking: Reported on 02/19/2022)     clopidogrel (PLAVIX) 75 MG tablet Take 1 tablet (75 mg total) by mouth daily. (Patient not taking: Reported on 02/02/2022) 30 tablet 0   hydrALAZINE (APRESOLINE) 50 MG tablet Take 1 tablet (50 mg total) by mouth every 8 (eight) hours. (Patient not taking: Reported on 12/18/2021) 90 tablet 1   HYDROcodone-acetaminophen (NORCO) 5-325 MG tablet Take 1 tablet by mouth every 6 (six) hours as needed for moderate pain or severe pain. (Patient not taking: Reported on 01/17/2022) 30 tablet 0   insulin glargine (LANTUS) 100 UNIT/ML injection Inject 10 Units into the skin daily. (Patient not taking: Reported on 12/18/2021)     metoprolol tartrate (LOPRESSOR) 50 MG tablet Take 1 tablet (50 mg total) by mouth 2 (two) times daily. 60 tablet 0   moxifloxacin (VIGAMOX) 0.5 % ophthalmic solution Apply to eye. (Patient not taking: Reported on 02/19/2022)     neomycin-polymyxin b-dexamethasone (MAXITROL) 3.5-10000-0.1 OINT  (Patient not taking: Reported on 02/19/2022)     potassium chloride (KLOR-CON) 10 MEQ tablet Take 10 mEq by mouth daily. (Patient not taking: Reported on 02/19/2022)     temazepam (RESTORIL) 15 MG capsule Take 15 mg by mouth at bedtime as needed for sleep. (Patient not taking: Reported on 01/17/2022)      Past Medical History:  Diagnosis Date   Allergy    Anemia    Asthma    Chronic kidney disease    Depression    situational   Diabetes mellitus without complication ([place])    Hypertension    Pneumonia     Past Surgical History:  Procedure Laterality Date   AV FISTULA  PLACEMENT Left 01/03/2022   Procedure: ARTERIOVENOUS (AV) FISTULA CREATION (BRACHIALCEPHALIC);  Surgeon: Katha Cabal, MD;  Location: [facility];  Service: Vascular;  Laterality: Left;   COLONOSCOPY N/A 09/22/2021   Procedure: COLONOSCOPY;  Surgeon: Annamaria Helling, DO;  Location: [facility];  Service: Gastroenterology;  Laterality: N/A;   EYE SURGERY Bilateral  detatch retina    Social History Social History   Tobacco Use   Smoking status: Never   Smokeless tobacco: Never  Vaping Use   Vaping Use: Never used  Substance Use Topics   Alcohol use: Never   Drug use: Never    Family History Family History  Problem Relation Age of Onset   Coronary artery disease Mother    Hypertension Father    Prostate cancer Father     Allergies  Allergen Reactions   Pine Hives and Itching     REVIEW OF SYSTEMS (Negative unless checked)  Constitutional: [] Weight loss  [] Fever  [] Chills Cardiac: [] Chest pain   [] Chest pressure   [] Palpitations   [] Shortness of breath when laying flat   [] Shortness of breath at rest   [] Shortness of breath with exertion. Vascular:  [] Pain in legs with walking   [] Pain in legs at rest   [] Pain in legs when laying flat   [] Claudication   [] Pain in feet when walking  [] Pain in feet at rest  [] Pain in feet when laying flat   [] History of DVT   [] Phlebitis   [] Swelling in legs   [] Varicose veins   [] Non-healing ulcers Pulmonary:   [] Uses home oxygen   [] Productive cough   [] Hemoptysis   [] Wheeze  [] COPD   [] Asthma Neurologic:  [] Dizziness  [] Blackouts   [] Seizures   [] History of stroke   [] History of TIA  [] Aphasia   [] Temporary blindness   [] Dysphagia   [] Weakness or numbness in arms   [] Weakness or numbness in legs Musculoskeletal:  [] Arthritis   [] Joint swelling   [] Joint pain   [] Low back pain Hematologic:  [] Easy bruising  [] Easy bleeding   [] Hypercoagulable state   [] Anemic  [] Hepatitis Gastrointestinal:  [] Blood in stool   [] Vomiting blood   [] Gastroesophageal reflux/heartburn   [] Difficulty swallowing. Genitourinary:  [x] Chronic kidney disease   [] Difficult urination  [] Frequent urination  [] Burning with urination   [] Blood in urine Skin:  [] Rashes   [] Ulcers   [] Wounds Psychological:  [] History of anxiety   [x]  History of major depression.  Physical Examination  Vitals:   02/19/22 0833 02/19/22 0834 02/19/22 1030 02/19/22 1315  BP:  135/74  (!) 164/79  Pulse:  (!) 59  66  Resp:  18    Temp:  98 F (36.7 C)  98 F (36.7 C)  TempSrc:  Oral  Oral  SpO2:  100%  100%  Weight:   85.6 kg   Height: 6\' 3"  (1.905 m)  6\' 3"  (1.905 m)    Body mass index is 23.59 kg/m. Gen:  WD/WN, NAD Head: [place]/AT, No temporalis wasting. Prominent temp pulse not noted. Ear/Nose/Throat: Hearing grossly intact, nares w/o erythema or drainage, oropharynx w/o Erythema/Exudate Eyes: Sclera non-icteric, conjunctiva reddened on right Neck: Trachea midline.  No JVD.  Pulmonary:  Good air movement, respirations not labored, equal bilaterally.  Cardiac: RRR, normal S1, S2. Vascular: Good thrill in left brachiocephalic AV fistula Vessel Right Left  Radial Palpable Palpable   Gastrointestinal: soft, non-tender/non-distended. No guarding/reflex.  Musculoskeletal: M/S 5/5 throughout.  Extremities without ischemic changes.  No deformity or atrophy. No edema. Neurologic: Sensation grossly intact in extremities.  Symmetrical.  Speech is fluent. Motor exam as listed above. Psychiatric: Judgment intact, Mood & affect appropriate for pt's clinical situation. Dermatologic: No rashes or ulcers noted.  No cellulitis or open wounds. Lymph : No Cervical, Axillary, or Inguinal lymphadenopathy.    CBC Lab Results  Component Value Date  WBC 12.5 (H) 02/19/2022   HGB 8.0 (L) 02/19/2022   HCT 24.5 (L) 02/19/2022   MCV 90.7 02/19/2022   PLT 283 02/19/2022    BMET    Component Value Date/Time   NA 139 02/19/2022 0844   K 4.5 02/19/2022 0844   CL 105  02/19/2022 0844   CO2 23 02/19/2022 0844   GLUCOSE 169 (H) 02/19/2022 0844   BUN 80 (H) 02/19/2022 0844   CREATININE 8.20 (H) 02/19/2022 0844   CALCIUM 9.1 02/19/2022 0844   GFRNONAA 8 (L) 02/19/2022 0844   Estimated Creatinine Clearance: 13.6 mL/min (A) (by C-G formula based on SCr of 8.2 mg/dL (H)).  COAG Lab Results  Component Value Date   INR 1.2 01/02/2022   INR 0.9 08/18/2021   INR 1.0 01/05/2021    Radiology DG Chest 1 View  Result Date: 02/19/2022 CLINICAL DATA:  Shortness of breath, dizziness EXAM: CHEST  1 VIEW COMPARISON:  06/10/2020 FINDINGS: The heart size and mediastinal contours are within normal limits. Both lungs are clear. The visualized skeletal structures are unremarkable. IMPRESSION: No active disease. Electronically Signed   By: Elmer Picker M.D.   On: 02/19/2022 09:28      Assessment/Plan 1.  End-stage renal disease  Today the patient presents with worsening kidney function and the need to begin dialysis more urgently than previously prepared, due to worsening symptoms such as shortness of breath and fatigue.  He had a left brachiocephalic AV fistula placed on 01/03/2022 which may be mature based on initial evaluation but he has not had confirmation with ultrasound.  We will plan for temp cath placement so the patient can begin dialysis.  We will also have the patient undergo a ultrasound to evaluate for maturity.  If it is noted that the access is mature we can begin to attempt to cannulate prior to discharge.  If cannulation for what ever reason is unsuccessful, we can plan on placement of PermCath prior to discharge.  2. Hypertension  Continue with outpatient medications as directed by primary team  3.  Weakness Will hopefully resolve with initiation of dialysis and treatment of uremic symptoms  Plan of care discussed with Dr. Lucky Cowboy and he is in agreement with plan noted above.   Family Communication: None at bedside  Total Time:55 minutes I  spent 55 minutes in this encounter including personally reviewing extensive medical records, personally reviewing imaging studies and compared to prior scans, counseling the patient, placing orders, coordinating care and performing appropriate documentation  Thank you for allowing [place] to participate in the care of this patient.   Bradley Hartmann, NP [facility] [phone] (Office Phone) [phone] (Office Fax) [phone] (Pager)  02/19/2022 3:11 PM  Staff may message me via secure chat in [place]  but this may not receive immediate response,  please page for urgent matters!  Dictation software was used to generate the above note. Typos may occur and escape review, as with typed/written notes. Any error is purely unintentional.  Please contact me directly for clarity if needed.

## 2022-02-19 NOTE — Assessment & Plan Note (Signed)
Secondary to worsening renal function with some uremia. Expect improvement in patient's symptoms with renal replacement therapy.

## 2022-02-20 ENCOUNTER — Inpatient Hospital Stay: Payer: BC Managed Care – PPO

## 2022-02-20 ENCOUNTER — Encounter: Payer: Self-pay | Admitting: Vascular Surgery

## 2022-02-20 DIAGNOSIS — E1121 Type 2 diabetes mellitus with diabetic nephropathy: Secondary | ICD-10-CM | POA: Diagnosis not present

## 2022-02-20 DIAGNOSIS — N19 Unspecified kidney failure: Secondary | ICD-10-CM | POA: Diagnosis present

## 2022-02-20 DIAGNOSIS — I10 Essential (primary) hypertension: Secondary | ICD-10-CM | POA: Diagnosis not present

## 2022-02-20 DIAGNOSIS — R531 Weakness: Secondary | ICD-10-CM | POA: Diagnosis not present

## 2022-02-20 DIAGNOSIS — N186 End stage renal disease: Secondary | ICD-10-CM | POA: Diagnosis not present

## 2022-02-20 LAB — CBC WITH DIFFERENTIAL/PLATELET
Abs Immature Granulocytes: 0.03 10*3/uL (ref 0.00–0.07)
Basophils Absolute: 0.1 10*3/uL (ref 0.0–0.1)
Basophils Relative: 1 %
Eosinophils Absolute: 1.2 10*3/uL — ABNORMAL HIGH (ref 0.0–0.5)
Eosinophils Relative: 11 %
HCT: 26 % — ABNORMAL LOW (ref 39.0–52.0)
Hemoglobin: 8.3 g/dL — ABNORMAL LOW (ref 13.0–17.0)
Immature Granulocytes: 0 %
Lymphocytes Relative: 16 %
Lymphs Abs: 1.8 10*3/uL (ref 0.7–4.0)
MCH: 29 pg (ref 26.0–34.0)
MCHC: 31.9 g/dL (ref 30.0–36.0)
MCV: 90.9 fL (ref 80.0–100.0)
Monocytes Absolute: 0.9 10*3/uL (ref 0.1–1.0)
Monocytes Relative: 8 %
Neutro Abs: 7.2 10*3/uL (ref 1.7–7.7)
Neutrophils Relative %: 64 %
Platelets: 282 10*3/uL (ref 150–400)
RBC: 2.86 MIL/uL — ABNORMAL LOW (ref 4.22–5.81)
RDW: 14.1 % (ref 11.5–15.5)
WBC: 11.1 10*3/uL — ABNORMAL HIGH (ref 4.0–10.5)
nRBC: 0 % (ref 0.0–0.2)

## 2022-02-20 LAB — HEPATITIS B CORE ANTIBODY, TOTAL: Hep B Core Total Ab: NONREACTIVE

## 2022-02-20 LAB — URINALYSIS, ROUTINE W REFLEX MICROSCOPIC
Bilirubin Urine: NEGATIVE
Glucose, UA: 50 mg/dL — AB
Ketones, ur: NEGATIVE mg/dL
Leukocytes,Ua: NEGATIVE
Nitrite: NEGATIVE
Protein, ur: >=300 mg/dL — AB
Specific Gravity, Urine: 1.007 (ref 1.005–1.030)
Squamous Epithelial / HPF: NONE SEEN (ref 0–5)
pH: 5 (ref 5.0–8.0)

## 2022-02-20 LAB — RENAL FUNCTION PANEL
Albumin: 3.3 g/dL — ABNORMAL LOW (ref 3.5–5.0)
Anion gap: 9 (ref 5–15)
BUN: 79 mg/dL — ABNORMAL HIGH (ref 6–20)
CO2: 23 mmol/L (ref 22–32)
Calcium: 8.7 mg/dL — ABNORMAL LOW (ref 8.9–10.3)
Chloride: 108 mmol/L (ref 98–111)
Creatinine, Ser: 8.08 mg/dL — ABNORMAL HIGH (ref 0.61–1.24)
GFR, Estimated: 8 mL/min — ABNORMAL LOW (ref >60)
Glucose, Bld: 138 mg/dL — ABNORMAL HIGH (ref 70–99)
Phosphorus: 7.3 mg/dL — ABNORMAL HIGH (ref 2.5–4.6)
Potassium: 4.3 mmol/L (ref 3.5–5.1)
Sodium: 140 mmol/L (ref 135–145)

## 2022-02-20 LAB — GLUCOSE, CAPILLARY
Glucose-Capillary: 77 mg/dL (ref 70–99)
Glucose-Capillary: 127 mg/dL — ABNORMAL HIGH (ref 70–99)
Glucose-Capillary: 98 mg/dL (ref 70–99)
Glucose-Capillary: 142 mg/dL — ABNORMAL HIGH (ref 70–99)

## 2022-02-20 LAB — HIV ANTIBODY (ROUTINE TESTING W REFLEX): HIV Screen 4th Generation wRfx: NONREACTIVE

## 2022-02-20 LAB — HEPATITIS B SURFACE ANTIGEN: Hepatitis B Surface Ag: NONREACTIVE

## 2022-02-20 MED ORDER — ANTICOAGULANT SODIUM CITRATE 4% (200MG/5ML) IV SOLN: 5.0000 mL | Status: DC | PRN

## 2022-02-20 MED ORDER — LIDOCAINE HCL (PF) 1 % IJ SOLN: 5.0000 mL | INTRAMUSCULAR | Status: DC | PRN

## 2022-02-20 MED ORDER — CHLORHEXIDINE GLUCONATE CLOTH 2 % EX PADS
6.0000 | MEDICATED_PAD | Freq: Every day | CUTANEOUS | Status: DC
  Administered 2022-02-20 – 2022-02-22 (×2): 6 via TOPICAL

## 2022-02-20 MED ORDER — HEPARIN SODIUM (PORCINE) 1000 UNIT/ML DIALYSIS: 1000.0000 [IU] | INTRAMUSCULAR | Status: DC | PRN

## 2022-02-20 MED ORDER — LOSARTAN POTASSIUM 50 MG PO TABS
100.0000 mg | ORAL_TABLET | Freq: Every day | ORAL | Status: DC
  Administered 2022-02-21 – 2022-02-24 (×4): 100 mg via ORAL
  Filled 2022-02-20 (×4): qty 2

## 2022-02-20 MED ORDER — ALTEPLASE 2 MG IJ SOLR: 2.0000 mg | Freq: Once | INTRAMUSCULAR | Status: DC | PRN

## 2022-02-20 MED ORDER — PENTAFLUOROPROP-TETRAFLUOROETH EX AERO: 1.0000 | INHALATION_SPRAY | CUTANEOUS | Status: DC | PRN

## 2022-02-20 MED ORDER — LIDOCAINE-PRILOCAINE 2.5-2.5 % EX CREA: 1.0000 | TOPICAL_CREAM | CUTANEOUS | Status: DC | PRN

## 2022-02-20 NOTE — Progress Notes (Signed)
   02/20/22 1637  Vitals  Temp 98.4 F (36.9 C)  Temp Source Oral  BP (!) 170/84  MAP (mmHg) 109  BP Location Right Arm  BP Method Automatic  Patient Position (if appropriate) Lying  Pulse Rate 61  Pulse Rate Source Monitor  ECG Heart Rate 61  Resp (!) 9  Oxygen Therapy  SpO2 100 %  O2 Device Room Air  Patient Activity (if Appropriate) In bed  Pulse Oximetry Type Continuous  During Treatment Monitoring  Blood Flow Rate (mL/min) 200 mL/min  HD Safety Checks Performed Yes  Intra-Hemodialysis Comments Tx completed  Post Treatment  Dialyzer Clearance Lightly streaked  Duration of HD Treatment -hour(s) 2 hour(s)  Hemodialysis Intake (mL) 0 mL  Liters Processed 24  Fluid Removed 0 mL  Tolerated HD Treatment Yes  Post-Hemodialysis Comments hd completed. no complications.  Hemodialysis Catheter Right Femoral vein Triple lumen Temporary (Non-Tunneled)  Placement Date/Time: 02/19/22 1837   Serial / Lot #: [id number]  Expiration Date: 12/22/22  Time Out: Correct patient;Correct site;Correct procedure  Maximum sterile barrier precautions: Hand hygiene;Cap;Mask;Sterile gown;Sterile gloves;Large sterile sh...  Site Condition No complications  Blue Lumen Status Heparin locked  Red Lumen Status Heparin locked  Purple Lumen Status N/A  Catheter fill solution Heparin 1000 units/ml  Catheter fill volume (Arterial) 1.8 cc  Catheter fill volume (Venous) 1.8  Dressing Type Transparent  Dressing Status Antimicrobial disc in place  Interventions New dressing  Drainage Description None  Dressing Change Due 02/21/22  Post treatment catheter status Capped and Clamped

## 2022-02-20 NOTE — Assessment & Plan Note (Signed)
Secondary to worsening renal function and accounts for patient's symptoms.  Should improve with dialysis.

## 2022-02-20 NOTE — Progress Notes (Signed)
[facility] Progress Note  Patient: Bradley Lopez    NTI:[id number]  DOA: 02/19/2022    Date of Service: the patient was seen and examined on 02/20/2022  Brief hospital course: Patient is a 46 year old male with past medical history of diabetes mellitus with renal complications and now stage V chronic kidney disease, secondary anemia, depression and hypertension.  Over the past 1 year, patient's creatinine has worsened from 1.96 to most recently 8.20.  Last month, patient had an AV fistula placed.  He started having weakness, dizziness and nausea for the past 2 weeks as well as lower extremity swelling and periorbital edema, times which were felt to be secondary to uremia from end-stage renal disease and patient was sent over to the emergency room on 10/30 by his nephrologist for initiation of dialysis.  Patient was admitted to the hospitalist service.  Unclear if fistula not ready yet for dialysis use and vascular surgery consulted and temporary femoral dialysis catheter placed that evening.  Assessment and Plan: Assessment and Plan: * Generalized weakness Secondary to worsening renal function with some uremia. Expect improvement in patient's symptoms with renal replacement therapy.    ESRD (end stage renal disease) (Bradley Lopez) Temporary dialysis catheter placed 10/30 by vascular surgery.  Patient's stage V chronic kidney disease has progressed now to end-stage.  Ultrasound pending to determine if fistula accessible.  Dialysis to be initiated, nephrology following.  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  Continue diuretics with furosemide and metolazone. Continue atorvastatin  Uremia Secondary to worsening renal function and accounts for patient's symptoms.  Should improve with dialysis.  Diabetes mellitus with stage 5 chronic kidney disease GFR <15 (Bradley Lopez) Continue long-acting insulin but decrease dose to 10  units daily since patient has anorexia and poor oral intake.  Hold glipizide  Essential hypertension Patient is on multiple antihypertensive medications which include lisinopril, amlodipine, metoprolol and clonidine. Monitor patient closely for bradycardia since he is on metoprolol and clonidine  Anemia due to chronic kidney disease Hemoglobin is 8 and appears to be patient's baseline Follow-up with hematology as an outpatient for iron infusions  Depression Continue mirtazapine       Body mass index is 22.9 kg/m.        Consultants: Nephrology Vascular surgery  Procedures: Placement temporary dialysis femoral catheter 10/30  Antimicrobials: None  Code Status: Full code   Subjective: Patient complains of being tired  Objective: Vital signs were reviewed and unremarkable. Vitals:   02/20/22 0747 02/20/22 1118  BP: (!) 176/82 (!) 186/86  Pulse: 66 71  Resp: 16 16  Temp: 97.8 F (36.6 C) 97.8 F (36.6 C)  SpO2: 100% 100%    Intake/Output Summary (Last 24 hours) at 02/20/2022 1344 Last data filed at 02/20/2022 1005 Gross per 24 hour  Intake 240 ml  Output --  Net 240 ml   Filed Weights   02/19/22 1030 02/20/22 0822  Weight: 85.6 kg 83.1 kg   Body mass index is 22.9 kg/m.  Exam:  General: Alert and oriented x3, fatigued HEENT: Cephalic, atraumatic, mucous membranes are moist Cardiovascular: Regular rate and rhythm, S1-S2 Respiratory: Clear to auscultation bilaterally Abdomen: Soft, nontender, nondistended, positive bowel sounds Musculoskeletal: No cyanosis, 2+ pitting edema Skin: No skin breaks, tears or lesions Psychiatry: Appropriate, no evidence of psychosis  Neurology: No focal deficits  Data Reviewed: No labs today  Disposition:  Status is: Inpatient Remains inpatient appropriate because:  -Initiation of dialysis -Clearance of uremia -Evaluation  of fistula    Anticipated discharge date: 11/2   Family Communication: Will call  family DVT Prophylaxis: SCDs Start: 02/19/22 1220    Author: Annita Brod ,MD 02/20/2022 1:44 PM  To reach On-call, see care teams to locate the attending and reach out via [email or web address]. Between 7PM-7AM, please contact night-coverage If you still have difficulty reaching the attending provider, please page the [facility] (Director on Call) for [facility] on amion for assistance.

## 2022-02-20 NOTE — Hospital Course (Addendum)
Patient is a 46 year old male with past medical history of diabetes mellitus with renal complications and now stage V chronic kidney disease, secondary anemia, depression and hypertension.  Over the past 1 year, patient's creatinine has worsened from 1.96 to most recently 8.20.  Last month, patient had an AV fistula placed.  He started having weakness, dizziness and nausea for the past 2 weeks as well as lower extremity swelling and periorbital edema, times which were felt to be secondary to uremia from end-stage renal disease and patient was sent over to the emergency room on 10/30 by his nephrologist for initiation of dialysis.  Patient was admitted to the hospitalist service.  Vascular surgery placed temporary femoral dialysis catheter on 10/30 and patient has been getting daily dialysis.  Ultrasound noted possible stenosis of fistula, so on 11/3, patient underwent placement of tunneled catheter and fistulogram which determined no evidence of stenosis in fistula and felt to be patent.

## 2022-02-20 NOTE — Progress Notes (Addendum)
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  Patient presents to the emergency department with complaints of weakness and fatigue.  Patient will be admitted for Generalized weakness [R53.1] Other fatigue [R53.83] Chronic kidney disease, unspecified CKD stage [N18.9]  Patient is known to our practice and is followed outpatient by Dr. Lanora Manis.  Patient was last seen in the office on February 14, 2022 where it was discussed with patient his advanced renal failure and the need to proceed with dialysis.   Patient seen resting in bed, alert and oriented Appetite remains appropriate No lower extremity edema Will receive first dialysis treatment today   Objective:  Vital signs in last 24 hours:  Temp:  [97.6 F (36.4 C)-98.2 F (36.8 C)] 97.8 F (36.6 C) (10/31 1118) Pulse Rate:  [57-92] 71 (10/31 1118) Resp:  [12-18] 16 (10/31 1118) BP: (134-186)/(75-111) 186/86 (10/31 1118) SpO2:  [97 %-100 %] 100 % (10/31 1118) Weight:  [83.1 kg] 83.1 kg (10/31 0822)  Weight change:  Filed Weights   02/19/22 1030 02/20/22 0822  Weight: 85.6 kg 83.1 kg    Intake/Output: No intake/output data recorded.   Intake/Output this shift:  Total I/O In: 240 [P.O.:240] Out: -   Physical Exam: General: NAD, resting comfortably  Head: Normocephalic, atraumatic. Moist oral mucosal membranes  Eyes: Anicteric  Lungs:  Clear to auscultation, normal effort, room air  Heart: Regular rate and rhythm  Abdomen:  Soft, nontender, nondistended  Extremities: No peripheral edema.  Neurologic: Nonfocal, moving all four extremities  Skin: No lesions  Access: Left upper aVF (maturing), Rt femoral temp cath    Basic Metabolic Panel: Recent Labs  Lab 02/19/22 0844  NA 139  K 4.5  CL 105   CO2 23  GLUCOSE 169*  BUN 80*  CREATININE 8.20*  CALCIUM 9.1     Liver Function Tests: No results for input(s): "AST", "ALT", "ALKPHOS", "BILITOT", "PROT", "ALBUMIN" in the last 168 hours. No results for input(s): "LIPASE", "AMYLASE" in the last 168 hours. No results for input(s): "AMMONIA" in the last 168 hours.  CBC: Recent Labs  Lab 02/19/22 0844  WBC 12.5*  HGB 8.0*  HCT 24.5*  MCV 90.7  PLT 283     Cardiac Enzymes: No results for input(s): "CKTOTAL", "CKMB", "CKMBINDEX", "TROPONINI" in the last 168 hours.  BNP: Invalid input(s): "POCBNP"  CBG: Recent Labs  Lab 02/19/22 0842 02/19/22 1314 02/19/22 1618 02/20/22 0755 02/20/22 1122  GLUCAP 196* 180* 136* 77 127*     Microbiology: Results for orders placed or performed during the hospital encounter of 02/19/22  Resp Panel by RT-PCR (Flu A&B, Covid) Anterior Nasal Swab     Status: None   Collection Time: 02/19/22 10:21 AM   Specimen: Anterior Nasal Swab  Result Value Ref Range Status   SARS Coronavirus 2 by RT PCR NEGATIVE NEGATIVE Final    Comment: (NOTE) SARS-CoV-2 target nucleic acids are NOT DETECTED.  The SARS-CoV-2 RNA is generally detectable in upper respiratory specimens during the acute phase of infection. The lowest concentration of SARS-CoV-2 viral copies this assay can detect is 138 copies/mL. A negative result does not preclude SARS-Cov-2 infection and should not be used as the sole basis for treatment or other patient management decisions. A negative result may occur with  improper specimen collection/handling, submission of specimen other than  nasopharyngeal swab, presence of viral mutation(s) within the areas targeted by this assay, and inadequate number of viral copies(<138 copies/mL). A negative result must be combined with clinical observations, patient history, and epidemiological information. The expected result is Negative.  Fact Sheet for Patients:   EntrepreneurPulse.com.au  Fact Sheet for Healthcare Providers:  IncredibleEmployment.be  This test is no t yet approved or cleared by the [place] FDA and  has been authorized for detection and/or diagnosis of SARS-CoV-2 by FDA under an Emergency Use Authorization (EUA). This EUA will remain  in effect (meaning this test can be used) for the duration of the COVID-19 declaration under Section 564(b)(1) of the Act, 21 U.S.C.section 360bbb-3(b)(1), unless the authorization is terminated  or revoked sooner.       Influenza A by PCR NEGATIVE NEGATIVE Final   Influenza B by PCR NEGATIVE NEGATIVE Final    Comment: (NOTE) The Xpert Xpress SARS-CoV-2/FLU/RSV plus assay is intended as an aid in the diagnosis of influenza from Nasopharyngeal swab specimens and should not be used as a sole basis for treatment. Nasal washings and aspirates are unacceptable for Xpert Xpress SARS-CoV-2/FLU/RSV testing.  Fact Sheet for Patients: EntrepreneurPulse.com.au  Fact Sheet for Healthcare Providers: IncredibleEmployment.be  This test is not yet approved or cleared by the [place] FDA and has been authorized for detection and/or diagnosis of SARS-CoV-2 by FDA under an Emergency Use Authorization (EUA). This EUA will remain in effect (meaning this test can be used) for the duration of the COVID-19 declaration under Section 564(b)(1) of the Act, 21 U.S.C. section 360bbb-3(b)(1), unless the authorization is terminated or revoked.  Performed at [facility], [address] [place], [place] [address]     Coagulation Studies: No results for input(s): "LABPROT", "INR" in the last 72 hours.  Urinalysis: No results for input(s): "COLORURINE", "LABSPEC", "PHURINE", "GLUCOSEU", "HGBUR", "BILIRUBINUR", "KETONESUR", "PROTEINUR", "UROBILINOGEN", "NITRITE", "LEUKOCYTESUR" in the last 72 hours.  Invalid input(s):  "APPERANCEUR"    Imaging: [place] Dialysis Access  Result Date: 02/20/2022 CLINICAL DATA:  46 year old male with a history dialysis circuit for hemodialysis EXAM: ULTRASOUND DIALYSIS ACCESS TECHNIQUE: Directed duplex performed of the dialysis circuit left upper extremity COMPARISON:  None Available. FINDINGS: Inflow artery velocity: 213 centimeter/second. Low resistance waveform without spectral broadening Arterial anastomosis velocity: 300 centimeter/second The venous outflow demonstrates a maximum velocity of 745 centimeter/second in the proximal venous outflow, which is greater than 3 times the velocity of the arterial inflow (ratio approaching 3.5). 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 Nadene Rubins, RPVI [facility] Radiology Electronically Signed   By: Corrie Mckusick D.O.   On: 02/20/2022 12:05   PERIPHERAL VASCULAR CATHETERIZATION  Result Date: 02/19/2022 See surgical note for result.  DG Chest 1 View  Result Date: 02/19/2022 CLINICAL DATA:  Shortness of breath, dizziness EXAM: CHEST  1 VIEW COMPARISON:  06/10/2020 FINDINGS: The heart size and mediastinal contours are within normal limits. Both lungs are clear. The visualized skeletal structures are unremarkable. IMPRESSION: No active disease. Electronically Signed   By: Elmer Picker M.D.   On: 02/19/2022 09:28     Medications:    sodium chloride     anticoagulant sodium citrate      amLODipine  10 mg Oral Daily   atorvastatin  80 mg Oral Daily   calcitRIOL  0.5 mcg Oral Daily   Chlorhexidine Gluconate Cloth  6 each Topical Daily   cloNIDine  0.3 mg Oral BID   furosemide  40 mg Oral BID   insulin detemir  10 Units  Subcutaneous Daily   lisinopril  20 mg Oral Daily   metolazone  5 mg Oral Daily   metoprolol tartrate  50 mg Oral BID   mirtazapine  15 mg Oral QHS   sodium chloride flush  3 mL Intravenous Q12H   ursodiol  300 mg Oral BID   sodium chloride, acetaminophen **OR** acetaminophen, alteplase, anticoagulant sodium citrate, heparin, lidocaine (PF), lidocaine-prilocaine, ondansetron **OR** ondansetron (ZOFRAN) IV, pentafluoroprop-tetrafluoroeth, sodium chloride flush  Assessment/ Plan:  Mr. Derrius Furtick is a 46 y.o.  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  Patient presents to the emergency department with complaints of weakness and fatigue.  Patient will be admitted for Generalized weakness [R53.1] Other fatigue [R53.83] Chronic kidney disease, unspecified CKD stage [N18.9]   Chronic kidney disease stage V.  BUN on admission 80.  Uremic symptoms present.  AV fistula placed in September.  Consulted vascular surgery for permcath placement. After discussing, vascular will place a HD temp cath and order ultrasound on fistula to determine maturation. Ultrasound shows some stenosis. Vascular planning to perform fistulogram on Friday with Permcath placement . Will proceed with initial dialysis treatment later today. Will receive dialysis for the next two days. Renal navigator preparing outpatient clinic search.   2. Anemia of chronic kidney disease Lab Results  Component Value Date   HGB 8.0 (L) 02/19/2022    Hemoglobin below target, will consider EPO with future treatments.   3. Secondary Hyperparathyroidism: with outpatient labs: PTH 236, phosphorus 6.2, calcium 8.7 on October 11, 2021.   Lab Results  Component Value Date   PTH 54 06/11/2020   CALCIUM 9.1 02/19/2022   CAION 1.14 (L) 01/03/2022    We will continue to monitor bone minerals.  4. Diabetes  mellitus type II with chronic kidney disease/renal manifestations: insulin  dependent. Home regimen includes glipizide, Lantus and Levemir. Most recent hemoglobin A1c is 7.7 on 02/02/2022.   Glucose well controlled.    LOS: 1 Sebastien Jackson 10/31/202312:09 PM

## 2022-02-20 NOTE — TOC Initial Note (Signed)
Transition of Care [facility]) - Initial/Assessment Note    Patient Details  Name: Bradley Lopez MRN: [id number] Date of Birth: November 02, 1975  Transition of Care [facility]) CM/SW Contact:    Laurena Slimmer, RN Phone Number: 02/20/2022, 12:37 PM  Clinical Narrative:                  Transition of Care [facility]) Screening Note   Patient Details  Name: Bradley Lopez Date of Birth: 29-May-1975   Transition of Care [facility]) CM/SW Contact:    Laurena Slimmer, RN Phone Number: 02/20/2022, 12:37 PM    Transition of Care Department [facility]) has reviewed patient and no TOC needs have been identified at this time. We will continue to monitor patient advancement through interdisciplinary progression rounds. If new patient transition needs arise, please place a TOC consult.          Patient Goals and CMS Choice        Expected Discharge Plan and Services                                                Prior Living Arrangements/Services                       Activities of Daily Living Home Assistive Devices/Equipment: None ADL Screening (condition at time of admission) Patient's cognitive ability adequate to safely complete daily activities?: Yes Is the patient deaf or have difficulty hearing?: No Does the patient have difficulty seeing, even when wearing glasses/contacts?: Yes Does the patient have difficulty concentrating, remembering, or making decisions?: No Patient able to express need for assistance with ADLs?: Yes Does the patient have difficulty dressing or bathing?: No Independently performs ADLs?: Yes (appropriate for developmental age) Does the patient have difficulty walking or climbing stairs?: No Weakness of Legs: None Weakness of Arms/Hands: None  Permission Sought/Granted                  Emotional Assessment              Admission diagnosis:  Generalized weakness [R53.1] Other fatigue [R53.83] Chronic kidney disease, unspecified CKD stage  [N18.9] Patient Active Problem List   Diagnosis Date Noted   Generalized weakness 02/19/2022   Depression    Proteinuria, unspecified 10/12/2021   Anemia in chronic kidney disease 10/11/2021   Hyperparathyroidism due to renal insufficiency ([place]) 10/11/2021   Hyperphosphatemia 10/11/2021   TIA (transient ischemic attack) 01/05/2021   Insomnia 09/15/2020   Chronic kidney disease 06/23/2020   Hypokalemia 06/16/2020   Iron deficiency anemia due to chronic blood loss 06/16/2020   Acute renal failure superimposed on stage 3a chronic kidney disease ([place]) 06/16/2020   Hypertensive urgency 06/14/2020   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 ([place]) 06/14/2020   Anemia due to chronic kidney disease 06/14/2020   Hypertensive encephalopathy syndrome    Vision blurring    Nausea & vomiting 06/11/2020   Anasarca 06/11/2020   Elevated troponin 06/11/2020   Hypomagnesemia 06/11/2020   CKD stage 3 due to type 2 diabetes mellitus ([place]) 06/11/2020   Hypertensive emergency 06/10/2020   Lymphedema 75/64/3329   Uncomplicated asthma 51/88/4166   Pain and swelling of lower leg 05/02/2020   Acute right-sided low back pain without sciatica 11/21/2018   History of DVT of lower extremity 11/21/2018   Mixed hyperlipidemia 11/21/2018   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, bilateral ([place]) 08/20/2018   Essential hypertension 08/20/2018   Generalized anxiety disorder 08/20/2018   Diabetes mellitus with stage 5 chronic kidney disease GFR <15 ([place]) 08/20/2018   PCP:  Baxter Hire, MD Pharmacy:   [id number] [place], [place] - [address] [place] [address] Phone: [phone] Fax: [phone]     Social Determinants of Health (SDOH) Interventions Food Insecurity Interventions: Intervention Not Indicated Housing Interventions: Intervention Not Indicated Transportation Interventions: Intervention  Not Indicated Utilities Interventions: Intervention Not Indicated  Readmission Risk Interventions     No data to display

## 2022-02-20 NOTE — Progress Notes (Signed)
Patient with bed alarm sounding.  Patient found to be in bed resting and states that he just repositioned himself in bed.  He asks that bed alarm be turned off.  He states that he will call out when he needs to stand or get OOB for any reason.  Bed alarm turned off per patient request.

## 2022-02-20 NOTE — Assessment & Plan Note (Addendum)
Temporary dialysis catheter placed 10/30 by vascular surgery.  Patient's stage V chronic kidney disease has progressed now to end-stage.  Questionable fistula stenosis seen on ultrasound, so patient underwent fistulogram on 11/3 which noted no evidence of stenosis and fistula felt to be patent.  Total PermCath placed as well which will be used for dialysis until fistula is ready.    Nephrology set up patient for outpatient dialysis on Monday/Wednesday/Friday

## 2022-02-21 DIAGNOSIS — I10 Essential (primary) hypertension: Secondary | ICD-10-CM | POA: Diagnosis not present

## 2022-02-21 DIAGNOSIS — R531 Weakness: Secondary | ICD-10-CM | POA: Diagnosis not present

## 2022-02-21 DIAGNOSIS — E1121 Type 2 diabetes mellitus with diabetic nephropathy: Secondary | ICD-10-CM | POA: Diagnosis not present

## 2022-02-21 DIAGNOSIS — N186 End stage renal disease: Secondary | ICD-10-CM | POA: Diagnosis not present

## 2022-02-21 LAB — CBC WITH DIFFERENTIAL/PLATELET
Abs Immature Granulocytes: 0.02 10*3/uL (ref 0.00–0.07)
Basophils Absolute: 0.1 10*3/uL (ref 0.0–0.1)
Basophils Relative: 1 %
Eosinophils Absolute: 1.2 10*3/uL — ABNORMAL HIGH (ref 0.0–0.5)
Eosinophils Relative: 11 %
HCT: 27.9 % — ABNORMAL LOW (ref 39.0–52.0)
Hemoglobin: 9 g/dL — ABNORMAL LOW (ref 13.0–17.0)
Immature Granulocytes: 0 %
Lymphocytes Relative: 20 %
Lymphs Abs: 2.2 10*3/uL (ref 0.7–4.0)
MCH: 29.2 pg (ref 26.0–34.0)
MCHC: 32.3 g/dL (ref 30.0–36.0)
MCV: 90.6 fL (ref 80.0–100.0)
Monocytes Absolute: 1.1 10*3/uL — ABNORMAL HIGH (ref 0.1–1.0)
Monocytes Relative: 10 %
Neutro Abs: 6.4 10*3/uL (ref 1.7–7.7)
Neutrophils Relative %: 58 %
Platelets: 292 10*3/uL (ref 150–400)
RBC: 3.08 MIL/uL — ABNORMAL LOW (ref 4.22–5.81)
RDW: 14.1 % (ref 11.5–15.5)
WBC: 11 10*3/uL — ABNORMAL HIGH (ref 4.0–10.5)
nRBC: 0 % (ref 0.0–0.2)

## 2022-02-21 LAB — RENAL FUNCTION PANEL
Albumin: 3.5 g/dL (ref 3.5–5.0)
Anion gap: 9 (ref 5–15)
BUN: 62 mg/dL — ABNORMAL HIGH (ref 6–20)
CO2: 26 mmol/L (ref 22–32)
Calcium: 8.9 mg/dL (ref 8.9–10.3)
Chloride: 107 mmol/L (ref 98–111)
Creatinine, Ser: 6.42 mg/dL — ABNORMAL HIGH (ref 0.61–1.24)
GFR, Estimated: 10 mL/min — ABNORMAL LOW (ref >60)
Glucose, Bld: 90 mg/dL (ref 70–99)
Phosphorus: 5.7 mg/dL — ABNORMAL HIGH (ref 2.5–4.6)
Potassium: 4 mmol/L (ref 3.5–5.1)
Sodium: 142 mmol/L (ref 135–145)

## 2022-02-21 LAB — GLUCOSE, CAPILLARY
Glucose-Capillary: 99 mg/dL (ref 70–99)
Glucose-Capillary: 91 mg/dL (ref 70–99)
Glucose-Capillary: 117 mg/dL — ABNORMAL HIGH (ref 70–99)
Glucose-Capillary: 190 mg/dL — ABNORMAL HIGH (ref 70–99)

## 2022-02-21 LAB — HEPATITIS B SURFACE ANTIBODY, QUANTITATIVE: Hep B S AB Quant (Post): 26.6 m[IU]/mL (ref >9.9)

## 2022-02-21 LAB — HEPATITIS B E ANTIBODY: Hep B E Ab: NEGATIVE

## 2022-02-21 MED ORDER — HEPARIN SODIUM (PORCINE) 1000 UNIT/ML IJ SOLN
INTRAMUSCULAR | Status: AC
  Filled 2022-02-21: qty 10

## 2022-02-21 MED ORDER — HEPARIN SODIUM (PORCINE) 5000 UNIT/ML IJ SOLN
5000.0000 [IU] | Freq: Three times a day (TID) | INTRAMUSCULAR | Status: DC
  Administered 2022-02-21 – 2022-02-24 (×6): 5000 [IU] via SUBCUTANEOUS
  Filled 2022-02-21 (×5): qty 1

## 2022-02-21 NOTE — Progress Notes (Signed)
Pt completed tx and tolerated well.  No fluid removal today.  Completed 2.5hrs. and processed 37.5L of blood.  B/p high at 194/91. P 76 temp 98.1  bloody dressing changed this tx with transparent dressing and disc in place.  Report given to floor nurse. Left in stable condition and no c/o

## 2022-02-21 NOTE — Progress Notes (Signed)
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  Patient presents to the emergency department with complaints of weakness and fatigue.  Patient will be admitted for Generalized weakness [R53.1] Other fatigue [R53.83] Chronic kidney disease, unspecified CKD stage [N18.9]  Patient is known to our practice and is followed outpatient by Dr. Lanora Manis.  Patient was last seen in the office on February 14, 2022 where it was discussed with patient his advanced renal failure and the need to proceed with dialysis.   Patient seen resting in bed, alert and oriented No family at bedside Tolerated the initial dialysis treatment well yesterday Denies pain or discomfort at this time.   Objective:  Vital signs in last 24 hours:  Temp:  [97.5 F (36.4 C)-98.4 F (36.9 C)] 98.3 F (36.8 C) (11/01 0834) Pulse Rate:  [57-74] 66 (11/01 0834) Resp:  [0-18] 16 (11/01 0834) BP: (134-186)/(70-86) 163/77 (11/01 0834) SpO2:  [100 %] 100 % (11/01 0834) Weight:  [87.2 kg] 87.2 kg (10/31 1645)  Weight change: -2.5 kg Filed Weights   02/19/22 1030 02/20/22 0822 02/20/22 1645  Weight: 85.6 kg 83.1 kg 87.2 kg    Intake/Output: I/O last 3 completed shifts: In: 240 [P.O.:240] Out: 500 [Urine:500]   Intake/Output this shift:  No intake/output data recorded.  Physical Exam: General: NAD, resting comfortably  Head: Normocephalic, atraumatic. Moist oral mucosal membranes  Eyes: Anicteric  Lungs:  Clear to auscultation, normal effort, room air  Heart: Regular rate and rhythm  Abdomen:  Soft, nontender, nondistended  Extremities: No peripheral edema.  Neurologic: Nonfocal, moving all four extremities  Skin: No lesions  Access: Left upper aVF (maturing), Rt femoral temp cath    Basic  Metabolic Panel: Recent Labs  Lab 02/19/22 0844 02/20/22 1420 02/21/22 0426  NA 139 140 142  K 4.5 4.3 4.0  CL 105 108 107  CO2 23 23 26   GLUCOSE 169* 138* 90  BUN 80* 79* 62*  CREATININE 8.20* 8.08* 6.42*  CALCIUM 9.1 8.7* 8.9  PHOS  --  7.3* 5.7*     Liver Function Tests: Recent Labs  Lab 02/20/22 1420 02/21/22 0426  ALBUMIN 3.3* 3.5   No results for input(s): "LIPASE", "AMYLASE" in the last 168 hours. No results for input(s): "AMMONIA" in the last 168 hours.  CBC: Recent Labs  Lab 02/19/22 0844 02/20/22 1420 02/21/22 0426  WBC 12.5* 11.1* 11.0*  NEUTROABS  --  7.2 6.4  HGB 8.0* 8.3* 9.0*  HCT 24.5* 26.0* 27.9*  MCV 90.7 90.9 90.6  PLT 283 282 292     Cardiac Enzymes: No results for input(s): "CKTOTAL", "CKMB", "CKMBINDEX", "TROPONINI" in the last 168 hours.  BNP: Invalid input(s): "POCBNP"  CBG: Recent Labs  Lab 02/20/22 0755 02/20/22 1122 02/20/22 1705 02/20/22 2005 02/21/22 0835  GLUCAP 77 127* 98 142* 99     Microbiology: Results for orders placed or performed during the hospital encounter of 02/19/22  Resp Panel by RT-PCR (Flu A&B, Covid) Anterior Nasal Swab     Status: None   Collection Time: 02/19/22 10:21 AM   Specimen: Anterior Nasal Swab  Result Value Ref Range Status   SARS Coronavirus 2 by RT PCR NEGATIVE NEGATIVE Final    Comment: (NOTE) SARS-CoV-2 target nucleic acids are NOT DETECTED.  The SARS-CoV-2 RNA is generally detectable in upper respiratory specimens  during the acute phase of infection. The lowest concentration of SARS-CoV-2 viral copies this assay can detect is 138 copies/mL. A negative result does not preclude SARS-Cov-2 infection and should not be used as the sole basis for treatment or other patient management decisions. A negative result may occur with  improper specimen collection/handling, submission of specimen other than nasopharyngeal swab, presence of viral mutation(s) within the areas targeted by this  assay, and inadequate number of viral copies(<138 copies/mL). A negative result must be combined with clinical observations, patient history, and epidemiological information. The expected result is Negative.  Fact Sheet for Patients:  EntrepreneurPulse.com.au  Fact Sheet for Healthcare Providers:  IncredibleEmployment.be  This test is no t yet approved or cleared by the [place] FDA and  has been authorized for detection and/or diagnosis of SARS-CoV-2 by FDA under an Emergency Use Authorization (EUA). This EUA will remain  in effect (meaning this test can be used) for the duration of the COVID-19 declaration under Section 564(b)(1) of the Act, 21 U.S.C.section 360bbb-3(b)(1), unless the authorization is terminated  or revoked sooner.       Influenza A by PCR NEGATIVE NEGATIVE Final   Influenza B by PCR NEGATIVE NEGATIVE Final    Comment: (NOTE) The Xpert Xpress SARS-CoV-2/FLU/RSV plus assay is intended as an aid in the diagnosis of influenza from Nasopharyngeal swab specimens and should not be used as a sole basis for treatment. Nasal washings and aspirates are unacceptable for Xpert Xpress SARS-CoV-2/FLU/RSV testing.  Fact Sheet for Patients: EntrepreneurPulse.com.au  Fact Sheet for Healthcare Providers: IncredibleEmployment.be  This test is not yet approved or cleared by the [place] FDA and has been authorized for detection and/or diagnosis of SARS-CoV-2 by FDA under an Emergency Use Authorization (EUA). This EUA will remain in effect (meaning this test can be used) for the duration of the COVID-19 declaration under Section 564(b)(1) of the Act, 21 U.S.C. section 360bbb-3(b)(1), unless the authorization is terminated or revoked.  Performed at [facility], [address] [place], [place] [address]     Coagulation Studies: No results for input(s): "LABPROT", "INR" in the last  72 hours.  Urinalysis: Recent Labs    02/20/22 2040  COLORURINE STRAW*  LABSPEC 1.007  PHURINE 5.0  GLUCOSEU 50*  [place] NEGATIVE  PROTEINUR >=300*  NITRITE NEGATIVE  LEUKOCYTESUR NEGATIVE      Imaging: [place] Dialysis Access  Result Date: 02/20/2022 CLINICAL DATA:  46 year old male with a history dialysis circuit for hemodialysis EXAM: ULTRASOUND DIALYSIS ACCESS TECHNIQUE: Directed duplex performed of the dialysis circuit left upper extremity COMPARISON:  None Available. FINDINGS: Inflow artery velocity: 213 centimeter/second. Low resistance waveform without spectral broadening Arterial anastomosis velocity: 300 centimeter/second The venous outflow demonstrates a maximum velocity of 745 centimeter/second in the proximal venous outflow, which is greater than 3 times the velocity of the arterial inflow (ratio approaching 3.5). 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 Nadene Rubins, RPVI [facility] Radiology Electronically Signed   By: Corrie Mckusick D.O.   On: 02/20/2022 12:05   PERIPHERAL VASCULAR CATHETERIZATION  Result Date: 02/19/2022 See surgical note for result.    Medications:    sodium chloride     anticoagulant sodium citrate      amLODipine  10 mg Oral Daily  atorvastatin  80 mg Oral Daily   calcitRIOL  0.5 mcg Oral Daily   Chlorhexidine Gluconate Cloth  6 each Topical Daily   cloNIDine  0.3 mg Oral BID   furosemide  40 mg Oral BID   insulin detemir  10 Units Subcutaneous Daily   losartan  100 mg Oral Daily   metolazone  5 mg Oral Daily   metoprolol tartrate  50 mg Oral BID   mirtazapine  15 mg Oral QHS   sodium chloride  flush  3 mL Intravenous Q12H   ursodiol  300 mg Oral BID   sodium chloride, acetaminophen **OR** acetaminophen, alteplase, anticoagulant sodium citrate, heparin, lidocaine (PF), lidocaine-prilocaine, ondansetron **OR** ondansetron (ZOFRAN) IV, pentafluoroprop-tetrafluoroeth, sodium chloride flush  Assessment/ Plan:  Mr. Bradley Lopez is a 46 y.o.  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  Patient presents to the emergency department with complaints of weakness and fatigue.  Patient will be admitted for Generalized weakness [R53.1] Other fatigue [R53.83] Chronic kidney disease, unspecified CKD stage [N18.9]   Chronic kidney disease stage V.  BUN on admission 80.  Uremic symptoms present.  AV fistula placed in September.  Appreciate vascular placing HD temp cath yesterday for initiation of dialysis.  AV fistula ultrasound completed yesterday concerning for stenosis, vascular surgery is planning on fistulogram Friday.  May proceed with PermCath placement at that time.  Patient receiving second dialysis treatment today.  Complained of mild chest pain, improved with supplemental oxygen.  States that this discomfort was present prior to dialysis.  We will plan for third dialysis treatment tomorrow.  Renal navigator currently seeking outpatient clinic.  2. Anemia of chronic kidney disease Lab Results  Component Value Date   HGB 9.0 (L) 02/21/2022    Hemoglobin remains within target.  We will continue to monitor and consider ESA's.  3. Secondary Hyperparathyroidism: with outpatient labs: PTH 236, phosphorus 6.2, calcium 8.7 on October 11, 2021.   Lab Results  Component Value Date   PTH 54 06/11/2020   CALCIUM 8.9 02/21/2022   CAION 1.14 (L) 01/03/2022   PHOS 5.7 (H) 02/21/2022    Calcium within desired target however phosphorus slightly elevated.  We will  continue to monitor.  4. 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 Home regimen includes glipizide, Lantus and Levemir. Most recent hemoglobin A1c is 7.7 on 02/02/2022.   Glucose stable, primary team to manage SSI   LOS: 2 [place] 11/1/202310:16 AM

## 2022-02-21 NOTE — Progress Notes (Signed)
To dialysis via transport.

## 2022-02-21 NOTE — Progress Notes (Signed)
[facility] Progress Note  Patient: Bradley Lopez    XVQ:[id number]  DOA: 02/19/2022    Date of Service: the patient was seen and examined on 02/21/2022  Brief hospital course: Patient is a 46 year old male with past medical history of diabetes mellitus with renal complications and now stage V chronic kidney disease, secondary anemia, depression and hypertension.  Over the past 1 year, patient's creatinine has worsened from 1.96 to most recently 8.20.  Last month, patient had an AV fistula placed.  He started having weakness, dizziness and nausea for the past 2 weeks as well as lower extremity swelling and periorbital edema, times which were felt to be secondary to uremia from end-stage renal disease and patient was sent over to the emergency room on 10/30 by his nephrologist for initiation of dialysis.  Patient was admitted to the hospitalist service.  Vascular surgery placed temporary femoral dialysis catheter on 10/30 and patient has been getting daily dialysis.  Ultrasound notes possible stenosis of fistula, so vascular surgery plans to take patient for fistulogram 11/3.    Assessment and Plan: Assessment and Plan: * Generalized weakness Secondary to worsening renal function with some uremia. Expect improvement in patient's symptoms with renal replacement therapy.    ESRD (end stage renal disease) ([place]) Temporary dialysis catheter placed 10/30 by vascular surgery.  Patient's stage V chronic kidney disease has progressed now to end-stage.  Questionable fistula stenosis seen on ultrasound, fistulogram planned for 11/3.  Patient now receiving daily dialysis.  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  Continue diuretics with furosemide and metolazone. Continue atorvastatin.  In addition, volume is also being managed by dialysis  Uremia Secondary to worsening renal function and accounts for patient's symptoms.   Should improve with dialysis.  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  Hold glipizide  Essential hypertension Patient is on multiple antihypertensive medications which include lisinopril, amlodipine, metoprolol and clonidine. Monitor patient closely for bradycardia since he is on metoprolol and clonidine  Anemia due to chronic kidney disease Hemoglobin is 8 and appears to be patient's baseline Follow-up with hematology as an outpatient for iron infusions  Depression Continue mirtazapine       Body mass index is 23.12 kg/m.        Consultants: Nephrology Vascular surgery  Procedures: Placement temporary dialysis femoral catheter 10/30 Planned fistulogram 11/3  Antimicrobials: None  Code Status: Full code   Subjective: Tired, denies shortness of breath  Objective: Vital signs were reviewed and unremarkable. Vitals:   02/21/22 1325 02/21/22 1330  BP: (!) 194/91 (!) 198/90  Pulse: 76   Resp: 16   Temp: 98.1 F (36.7 C)   SpO2: 100% 100%    Intake/Output Summary (Last 24 hours) at 02/21/2022 1402 Last data filed at 02/21/2022 1325 Gross per 24 hour  Intake 120 ml  Output 500 ml  Net -380 ml    Filed Weights   02/20/22 0822 02/20/22 1645 02/21/22 1025  Weight: 83.1 kg 87.2 kg 83.9 kg   Body mass index is 23.12 kg/m.  Exam:  General: Alert and oriented x3, fatigued HEENT: Normocephalic, atraumatic, mucous membranes are moist Cardiovascular: Regular rate and rhythm, S1-S2 Respiratory: Clear to auscultation bilaterally Abdomen: Soft, nontender, nondistended, positive bowel sounds Musculoskeletal: No cyanosis, 2+ pitting edema Skin: No skin breaks, tears or lesions Psychiatry: Appropriate, no  evidence of psychosis  Neurology: No focal deficits  Data Reviewed: Hemoglobin 9.0, white count of 11, renal  function consistent with end-stage renal disease  Disposition:  Status is: Inpatient Remains inpatient appropriate because:  -Initiation of dialysis -Clearance of uremia -Evaluation of fistula    Anticipated discharge date: 11/6   Family Communication: Will call family DVT Prophylaxis: heparin injection 5,000 Units Start: 02/21/22 2200 SCDs Start: 02/19/22 1220    Author: Annita Brod ,MD 02/21/2022 2:02 PM  To reach On-call, see care teams to locate the attending and reach out via [email or web address]. Between 7PM-7AM, please contact night-coverage If you still have difficulty reaching the attending provider, please page the [facility] (Director on Call) for [facility] on amion for assistance.

## 2022-02-21 NOTE — Progress Notes (Signed)
   02/20/22 1434 02/20/22 1500 02/20/22 1530  Vitals  Temp 97.9 F (36.6 C)  --   --   Temp Source Oral  --   --   BP 137/71 134/78 (!) 141/75  MAP (mmHg) 91 94 96  BP Location Right Arm  --   --   BP Method Manual  --   --   Patient Position (if appropriate) Lying  --   --   Pulse Rate 63 (!) 57 (!) 57  Pulse Rate Source Monitor  --   --   ECG Heart Rate 63 (!) 57 (!) 56  Resp 18 12 (!) 0  During Treatment Monitoring  Blood Flow Rate (mL/min) 200 mL/min 200 mL/min 200 mL/min  Arterial Pressure (mmHg) -60 mmHg -70 mmHg -70 mmHg  Venous Pressure (mmHg) 90 mmHg 90 mmHg 90 mmHg  TMP (mmHg) 13 mmHg 14 mmHg 13 mmHg  Ultrafiltration Rate (mL/min) 300 mL/min 300 mL/min 300 mL/min  Dialysate Flow Rate (mL/min) 200 ml/min 200 ml/min 200 ml/min  HD Safety Checks Performed Yes Yes Yes  Intra-Hemodialysis Comments Tx initiated Progressing as prescribed Progressing as prescribed  Dialysate Change 3K;2.5 Ca  --   --     02/20/22 1600 02/20/22 1630 02/20/22 1637  Vitals  Temp  --   --  98.4 F (36.9 C)  Temp Source  --   --  Oral  BP (!) 161/79 (!) 158/84 (Simultaneous filing. User may not have seen previous data.) (!) 170/84  MAP (mmHg) 103 107 (Simultaneous filing. User may not have seen previous data.) 109  BP Location Right Arm Right Arm Right Arm  BP Method Automatic Automatic (Simultaneous filing. User may not have seen previous data.) Automatic  Patient Position (if appropriate) Lying Lying (Simultaneous filing. User may not have seen previous data.) Lying  Pulse Rate 61 60 (Simultaneous filing. User may not have seen previous data.) 61  Pulse Rate Source Monitor Monitor (Simultaneous filing. User may not have seen previous data.) Monitor  ECG Heart Rate 60 61 (Simultaneous filing. User may not have seen previous data.) 61  Resp (!) 9 (!) 9 (Simultaneous filing. User may not have seen previous data.) (!) 9  During Treatment Monitoring  Blood Flow Rate (mL/min) 200 mL/min 200  mL/min (Simultaneous filing. User may not have seen previous data.) 200 mL/min  Arterial Pressure (mmHg) -70 mmHg -70 mmHg (Simultaneous filing. User may not have seen previous data.)  --   Venous Pressure (mmHg) 90 mmHg 90 mmHg (Simultaneous filing. User may not have seen previous data.)  --   TMP (mmHg) 14 mmHg 14 mmHg (Simultaneous filing. User may not have seen previous data.)  --   Ultrafiltration Rate (mL/min) 300 mL/min 300 mL/min (Simultaneous filing. User may not have seen previous data.)  --   Dialysate Flow Rate (mL/min) 200 ml/min 200 ml/min (Simultaneous filing. User may not have seen previous data.)  --   HD Safety Checks Performed Yes Yes (Simultaneous filing. User may not have seen previous data.) Yes  Intra-Hemodialysis Comments Progressing as prescribed Progressing as prescribed (Simultaneous filing. User may not have seen previous data.) Tx completed  Dialysate Change  --   --   --

## 2022-02-21 NOTE — Progress Notes (Signed)
New Dialysis Start   Patient identified as new dialysis start. Kidney Education packet assembled and given. Discussed the following items with patient:    Current medications and possible changes once started:  Discussed that patient's medications may change over time.  Ex; hypertension medications and diabetes medication.  Nephrologists will adjust as needed.  Fluid restrictions reviewed:  32 oz daily goal:  All liquids count; soups, ice, jello   Phosphorus and potassium: Handout given showing high potassium and phosphorus foods.  Alternative food and drink options given.  Family support:  no family present  Outpatient Clinic Resources:  Discussed roles of Outpatient clinic  staff and advised to make a list of needs, if any, to talk with outpatient staff if needed  Care plan schedule: Informed patient  of Care Plans in outpatient setting and to participate in the care plan.  An invitation would be given from outpatient clinic.   Dialysis Access Options:  Reviewed access options with patients. Discussed in detail about care at home with new AVG & AVF. Reviewed checking bruit and thrill. If dialysis catheter present, educated that patient could not take showers.  Catheter dressing changes were to be done by outpatient clinic staff only  Home therapy options:  Educated patient about home therapy options:  PD vs home hemo.     Patient verbalized understanding. Will continue to round on patient during admission.    Xena Propst W Vollie Brunty, RN   

## 2022-02-21 NOTE — Progress Notes (Signed)
   02/21/22 1047 02/21/22 1100 02/21/22 1130  Vitals  Temp  --   --   --   Temp Source  --   --   --   BP (!) 169/84 (!) 179/90 (!) 179/88  MAP (mmHg) 107 114 114  BP Location Right Arm Right Arm Right Arm  BP Method Automatic Automatic Automatic  Patient Position (if appropriate) Lying Lying Lying  Pulse Rate 75 77 78  Pulse Rate Source Monitor Monitor Monitor  ECG Heart Rate 75 77 74  Resp 12 13 15   During Treatment Monitoring  Blood Flow Rate (mL/min) 250 mL/min 250 mL/min 250 mL/min  Arterial Pressure (mmHg) -70 mmHg -100 mmHg -100 mmHg  Venous Pressure (mmHg) 100 mmHg 130 mmHg 130 mmHg  TMP (mmHg) 0 mmHg 11 mmHg 11 mmHg  Ultrafiltration Rate (mL/min) 280 mL/min 280 mL/min 280 mL/min  Dialysate Flow Rate (mL/min) 300 ml/min 300 ml/min 300 ml/min  HD Safety Checks Performed Yes Yes Yes  Intra-Hemodialysis Comments Tx initiated Progressing as prescribed Progressing as prescribed    02/21/22 1200 02/21/22 1230 02/21/22 1300  Vitals  Temp  --   --   --   Temp Source  --   --   --   BP (!) 176/86 (!) 180/89 (!) 173/84  MAP (mmHg) 114 113 107  BP Location Right Arm Right Arm Right Arm  BP Method Automatic Automatic Automatic  Patient Position (if appropriate) Lying Lying Lying  Pulse Rate 77 73 68  Pulse Rate Source Monitor Monitor Monitor  ECG Heart Rate 77 72 68  Resp 11 12 11   During Treatment Monitoring  Blood Flow Rate (mL/min) 250 mL/min 250 mL/min 250 mL/min  Arterial Pressure (mmHg) -100 mmHg -100 mmHg -100 mmHg  Venous Pressure (mmHg) 130 mmHg 130 mmHg 120 mmHg  TMP (mmHg) 7 mmHg 9 mmHg 7 mmHg  Ultrafiltration Rate (mL/min) 0 mL/min 236 mL/min 99 mL/min  Dialysate Flow Rate (mL/min) 300 ml/min 300 ml/min 300 ml/min  HD Safety Checks Performed Yes Yes Yes  Intra-Hemodialysis Comments See progress note (uf turned off  100cc ns given) Progressing as prescribed (pain easing up  feeling better) Progressing as prescribed    02/21/22 1325  Vitals  Temp 98.1 F (36.7  C)  Temp Source Oral  BP (!) 194/91  MAP (mmHg) 122  BP Location Right Arm  BP Method Automatic  Patient Position (if appropriate) Lying  Pulse Rate 76  Pulse Rate Source Monitor  ECG Heart Rate 75  Resp 16  During Treatment Monitoring  Blood Flow Rate (mL/min) 250 mL/min  Arterial Pressure (mmHg) -100 mmHg  Venous Pressure (mmHg) 120 mmHg  TMP (mmHg) 8 mmHg  Ultrafiltration Rate (mL/min) 99 mL/min  Dialysate Flow Rate (mL/min) 300 ml/min  HD Safety Checks Performed Yes  Intra-Hemodialysis Comments Tx completed;Tolerated well

## 2022-02-22 ENCOUNTER — Ambulatory Visit (INDEPENDENT_AMBULATORY_CARE_PROVIDER_SITE_OTHER): Payer: BC Managed Care – PPO | Admitting: Nurse Practitioner

## 2022-02-22 ENCOUNTER — Encounter (INDEPENDENT_AMBULATORY_CARE_PROVIDER_SITE_OTHER): Payer: BC Managed Care – PPO

## 2022-02-22 DIAGNOSIS — R531 Weakness: Secondary | ICD-10-CM | POA: Diagnosis not present

## 2022-02-22 DIAGNOSIS — N19 Unspecified kidney failure: Secondary | ICD-10-CM | POA: Diagnosis not present

## 2022-02-22 DIAGNOSIS — N186 End stage renal disease: Secondary | ICD-10-CM | POA: Diagnosis not present

## 2022-02-22 DIAGNOSIS — I10 Essential (primary) hypertension: Secondary | ICD-10-CM | POA: Diagnosis not present

## 2022-02-22 LAB — GLUCOSE, CAPILLARY
Glucose-Capillary: 114 mg/dL — ABNORMAL HIGH (ref 70–99)
Glucose-Capillary: 98 mg/dL (ref 70–99)
Glucose-Capillary: 165 mg/dL — ABNORMAL HIGH (ref 70–99)

## 2022-02-22 MED ORDER — RENA-VITE PO TABS
1.0000 | ORAL_TABLET | Freq: Every day | ORAL | Status: DC
  Administered 2022-02-22 – 2022-02-23 (×2): 1 via ORAL
  Filled 2022-02-22 (×2): qty 1

## 2022-02-22 MED ORDER — HYDRALAZINE HCL 20 MG/ML IJ SOLN: 10.0000 mg | Freq: Four times a day (QID) | INTRAMUSCULAR | Status: DC | PRN

## 2022-02-22 MED ORDER — MINOXIDIL 2.5 MG PO TABS
5.0000 mg | ORAL_TABLET | Freq: Every day | ORAL | Status: DC
  Filled 2022-02-22: qty 2

## 2022-02-22 MED ORDER — HYDRALAZINE HCL 50 MG PO TABS
50.0000 mg | ORAL_TABLET | Freq: Three times a day (TID) | ORAL | Status: DC
  Filled 2022-02-22: qty 1

## 2022-02-22 MED ORDER — CARVEDILOL 6.25 MG PO TABS
12.5000 mg | ORAL_TABLET | Freq: Two times a day (BID) | ORAL | Status: DC
  Administered 2022-02-22 – 2022-02-24 (×5): 12.5 mg via ORAL
  Filled 2022-02-22 (×5): qty 2

## 2022-02-22 NOTE — Progress Notes (Signed)
[facility] Progress Note  Patient: Bradley Lopez    NAT:[id number]  DOA: 02/19/2022    Date of Service: the patient was seen and examined on 02/22/2022  Brief hospital course: Patient is a 46 year old male with past medical history of diabetes mellitus with renal complications and now stage V chronic kidney disease, secondary anemia, depression and hypertension.  Over the past 1 year, patient's creatinine has worsened from 1.96 to most recently 8.20.  Last month, patient had an AV fistula placed.  He started having weakness, dizziness and nausea for the past 2 weeks as well as lower extremity swelling and periorbital edema, times which were felt to be secondary to uremia from end-stage renal disease and patient was sent over to the emergency room on 10/30 by his nephrologist for initiation of dialysis.  Patient was admitted to the hospitalist service.  Vascular surgery placed temporary femoral dialysis catheter on 10/30 and patient has been getting daily dialysis.  Ultrasound notes possible stenosis of fistula, so vascular surgery plans to take patient for fistulogram 11/3.    Assessment and Plan: Assessment and Plan: * Generalized weakness Secondary to worsening renal function with some uremia. Expect improvement in patient's symptoms with renal replacement therapy.    ESRD (end stage renal disease) ([place]) Temporary dialysis catheter placed 10/30 by vascular surgery.  Patient's stage V chronic kidney disease has progressed now to end-stage.  Questionable fistula stenosis seen on ultrasound, fistulogram planned for 11/3.  Patient now receiving daily dialysis.  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  Continue diuretics with furosemide and metolazone. Continue atorvastatin.  In addition, volume is also being managed by dialysis  Uremia Secondary to worsening renal function and accounts for patient's symptoms.   Should improve with dialysis.  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  Hold glipizide  Essential hypertension Patient is on multiple antihypertensive medications which include lisinopril, amlodipine, metoprolol and clonidine. Monitor patient closely for bradycardia since he is on metoprolol and clonidine.  Trending upward.  Added both as needed and hydralazine scheduled  Anemia due to chronic kidney disease Hemoglobin is 8 and appears to be patient's baseline Follow-up with hematology as an outpatient for iron infusions  Depression Continue mirtazapine       Body mass index is 22.46 kg/m.        Consultants: Nephrology Vascular surgery  Procedures: Placement temporary dialysis femoral catheter 10/30 Planned fistulogram 11/3  Antimicrobials: None  Code Status: Full code   Subjective: Tired, denies shortness of breath  Objective: Vital signs were reviewed and unremarkable. Vitals:   02/22/22 1205 02/22/22 1256  BP: (!) 195/101 (!) 183/90  Pulse: 82 83  Resp: (!) 9 16  Temp:  97.6 F (36.4 C)  SpO2: 100% 100%    Intake/Output Summary (Last 24 hours) at 02/22/2022 1407 Last data filed at 02/22/2022 1200 Gross per 24 hour  Intake 240 ml  Output 0.5 ml  Net 239.5 ml    Filed Weights   02/21/22 1410 02/22/22 0839 02/22/22 1227  Weight: 83.9 kg 82.2 kg 81.5 kg   Body mass index is 22.46 kg/m.  Exam:  General: Alert and oriented x3, fatigued HEENT: Normocephalic, atraumatic, mucous membranes are moist Cardiovascular: Regular rate and rhythm, S1-S2 Respiratory: Clear to auscultation bilaterally Abdomen: Soft, nontender, nondistended, positive bowel sounds Musculoskeletal: No cyanosis, 2+  pitting edema Skin: No skin breaks, tears or lesions Psychiatry: Appropriate, no evidence of psychosis  Neurology: No focal  deficits  Data Reviewed: No labs today  Disposition:  Status is: Inpatient Remains inpatient appropriate because:  -Initiation of dialysis -Clearance of uremia -Evaluation of fistula -Stabilization of blood pressure    Anticipated discharge date: 11/6   Family Communication: Will call family DVT Prophylaxis: heparin injection 5,000 Units Start: 02/21/22 2200 SCDs Start: 02/19/22 1220    Author: Annita Brod ,MD 02/22/2022 2:07 PM  To reach On-call, see care teams to locate the attending and reach out via [email or web address]. Between 7PM-7AM, please contact night-coverage If you still have difficulty reaching the attending provider, please page the [facility] (Director on Call) for [facility] on amion for assistance.

## 2022-02-22 NOTE — Progress Notes (Signed)
Responded to pt's call.  Pt states "I feel really light headed right now."  Patient said he just went to the bathroom and sat for a few minutes on the edge of the for dinner. BP 106/64, HR 77, no other complaints.  Dr. Maryland Pink made aware.

## 2022-02-22 NOTE — TOC Progression Note (Signed)
Transition of Care [facility]) - Progression Note    Patient Details  Name: Bradley Lopez MRN: [id number] Date of Birth: February 10, 1976  [facility] CM/SW Contact  Laurena Slimmer, RN Phone Number: 02/22/2022, 3:46 PM  Clinical Narrative:    Spoke with patient. He inquired about medicaid applications. Patient advised he would need to contact DSS to have medicaid application started.         Expected Discharge Plan and Services                                                 Social Determinants of Health (SDOH) Interventions Food Insecurity Interventions: Intervention Not Indicated Housing Interventions: Intervention Not Indicated Transportation Interventions: Intervention Not Indicated Utilities Interventions: Intervention Not Indicated  Readmission Risk Interventions     No data to display

## 2022-02-22 NOTE — Progress Notes (Signed)
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  Patient presents to the emergency department with complaints of weakness and fatigue.  Patient will be admitted for Generalized weakness [R53.1] Other fatigue [R53.83] Chronic kidney disease, unspecified CKD stage [N18.9]  Patient is known to our practice and is followed outpatient by Dr. Lanora Manis.  Patient was last seen in the office on February 14, 2022 where it was discussed with patient his advanced renal failure and the need to proceed with dialysis.   Patient seen and evaluated during dialysis   HEMODIALYSIS FLOWSHEET:  Blood Flow Rate (mL/min): 300 mL/min Arterial Pressure (mmHg): -130 mmHg Venous Pressure (mmHg): 160 mmHg TMP (mmHg): 12 mmHg Ultrafiltration Rate (mL/min): 344 mL/min Dialysate Flow Rate (mL/min): 300 ml/min Dialysis Fluid Bolus: Normal Saline  Tolerating treatment well States he feels better since beginning dialysis  Objective:  Vital signs in last 24 hours:  Temp:  [97.5 F (36.4 C)-98.9 F (37.2 C)] 97.5 F (36.4 C) (11/02 0839) Pulse Rate:  [57-85] 82 (11/02 1205) Resp:  [8-19] 9 (11/02 1205) BP: (129-203)/(68-101) 195/101 (11/02 1205) SpO2:  [100 %] 100 % (11/02 1205) Weight:  [81.5 kg-83.9 kg] 81.5 kg (11/02 1227)  Weight change: 0.8 kg Filed Weights   02/21/22 1410 02/22/22 0839 02/22/22 1227  Weight: 83.9 kg 82.2 kg 81.5 kg    Intake/Output: I/O last 3 completed shifts: In: 360 [P.O.:360] Out: 500 [Urine:500]   Intake/Output this shift:  Total I/O In: -  Out: 0.5 [Other:0.5]  Physical Exam: General: NAD, resting comfortably  Head: Normocephalic, atraumatic. Moist oral mucosal membranes  Eyes: Anicteric  Lungs:  Clear to auscultation, normal effort, room air  Heart:  Regular rate and rhythm  Abdomen:  Soft, nontender, nondistended  Extremities: No peripheral edema.  Neurologic: Nonfocal, moving all four extremities  Skin: No lesions  Access: Left upper aVF (maturing), Rt femoral temp cath    Basic Metabolic Panel: Recent Labs  Lab 02/19/22 0844 02/20/22 1420 02/21/22 0426  NA 139 140 142  K 4.5 4.3 4.0  CL 105 108 107  CO2 23 23 26   GLUCOSE 169* 138* 90  BUN 80* 79* 62*  CREATININE 8.20* 8.08* 6.42*  CALCIUM 9.1 8.7* 8.9  PHOS  --  7.3* 5.7*     Liver Function Tests: Recent Labs  Lab 02/20/22 1420 02/21/22 0426  ALBUMIN 3.3* 3.5    No results for input(s): "LIPASE", "AMYLASE" in the last 168 hours. No results for input(s): "AMMONIA" in the last 168 hours.  CBC: Recent Labs  Lab 02/19/22 0844 02/20/22 1420 02/21/22 0426  WBC 12.5* 11.1* 11.0*  NEUTROABS  --  7.2 6.4  HGB 8.0* 8.3* 9.0*  HCT 24.5* 26.0* 27.9*  MCV 90.7 90.9 90.6  PLT 283 282 292     Cardiac Enzymes: No results for input(s): "CKTOTAL", "CKMB", "CKMBINDEX", "TROPONINI" in the last 168 hours.  BNP: Invalid input(s): "POCBNP"  CBG: Recent Labs  Lab 02/21/22 0835 02/21/22 1420 02/21/22 1557 02/21/22 1945 02/22/22 0748  GLUCAP 99 91 117* 190* 114*     Microbiology: Results for orders placed or performed during the hospital encounter of 02/19/22  Resp Panel by RT-PCR (Flu A&B, Covid) Anterior Nasal Swab     Status: None   Collection Time: 02/19/22 10:21 AM   Specimen: Anterior Nasal Swab  Result Value  Ref Range Status   SARS Coronavirus 2 by RT PCR NEGATIVE NEGATIVE Final    Comment: (NOTE) SARS-CoV-2 target nucleic acids are NOT DETECTED.  The SARS-CoV-2 RNA is generally detectable in upper respiratory specimens during the acute phase of infection. The lowest concentration of SARS-CoV-2 viral copies this assay can detect is 138 copies/mL. A negative result does not preclude SARS-Cov-2 infection and should not be used as the sole basis  for treatment or other patient management decisions. A negative result may occur with  improper specimen collection/handling, submission of specimen other than nasopharyngeal swab, presence of viral mutation(s) within the areas targeted by this assay, and inadequate number of viral copies(<138 copies/mL). A negative result must be combined with clinical observations, patient history, and epidemiological information. The expected result is Negative.  Fact Sheet for Patients:  EntrepreneurPulse.com.au  Fact Sheet for Healthcare Providers:  IncredibleEmployment.be  This test is no t yet approved or cleared by the [place] FDA and  has been authorized for detection and/or diagnosis of SARS-CoV-2 by FDA under an Emergency Use Authorization (EUA). This EUA will remain  in effect (meaning this test can be used) for the duration of the COVID-19 declaration under Section 564(b)(1) of the Act, 21 U.S.C.section 360bbb-3(b)(1), unless the authorization is terminated  or revoked sooner.       Influenza A by PCR NEGATIVE NEGATIVE Final   Influenza B by PCR NEGATIVE NEGATIVE Final    Comment: (NOTE) The Xpert Xpress SARS-CoV-2/FLU/RSV plus assay is intended as an aid in the diagnosis of influenza from Nasopharyngeal swab specimens and should not be used as a sole basis for treatment. Nasal washings and aspirates are unacceptable for Xpert Xpress SARS-CoV-2/FLU/RSV testing.  Fact Sheet for Patients: EntrepreneurPulse.com.au  Fact Sheet for Healthcare Providers: IncredibleEmployment.be  This test is not yet approved or cleared by the [place] FDA and has been authorized for detection and/or diagnosis of SARS-CoV-2 by FDA under an Emergency Use Authorization (EUA). This EUA will remain in effect (meaning this test can be used) for the duration of the COVID-19 declaration under Section 564(b)(1) of the Act, 21  U.S.C. section 360bbb-3(b)(1), unless the authorization is terminated or revoked.  Performed at [facility], [address] [place], [place] [address]     Coagulation Studies: No results for input(s): "LABPROT", "INR" in the last 72 hours.  Urinalysis: Recent Labs    02/20/22 2040  COLORURINE STRAW*  LABSPEC 1.007  PHURINE 5.0  GLUCOSEU 50*  HGBUR MODERATE*  BILIRUBINUR NEGATIVE  KETONESUR NEGATIVE  PROTEINUR >=300*  NITRITE NEGATIVE  LEUKOCYTESUR NEGATIVE       Imaging: No results found.   Medications:    sodium chloride     anticoagulant sodium citrate      amLODipine  10 mg Oral Daily   atorvastatin  80 mg Oral Daily   calcitRIOL  0.5 mcg Oral Daily   carvedilol  12.5 mg Oral BID   Chlorhexidine Gluconate Cloth  6 each Topical Daily   cloNIDine  0.3 mg Oral BID   furosemide  40 mg Oral BID   heparin injection (subcutaneous)  5,000 Units Subcutaneous Q8H   insulin detemir  10 Units Subcutaneous Daily   losartan  100 mg Oral Daily   metolazone  5 mg Oral Daily   mirtazapine  15 mg Oral QHS   sodium chloride flush  3 mL Intravenous Q12H   ursodiol  300 mg Oral BID   sodium chloride, acetaminophen **OR** acetaminophen, alteplase, anticoagulant sodium  citrate, heparin, lidocaine (PF), lidocaine-prilocaine, ondansetron **OR** ondansetron (ZOFRAN) IV, pentafluoroprop-tetrafluoroeth, sodium chloride flush  Assessment/ Plan:  Mr. Gavriel Holzhauer is a 46 y.o.  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  Patient presents to the emergency department with complaints of weakness and fatigue.  Patient will be admitted for Generalized weakness [R53.1] Other fatigue [R53.83] Chronic kidney disease, unspecified CKD stage [N18.9]   Chronic kidney disease stage V.  BUN on admission 80.  Uremic symptoms present.  AV fistula  placed in September.  HD access ultrasound shows possible stenosis.  Vascular surgery planning for fistulogram tomorrow with PermCath placement.  Patient received third dialysis treatment today, tolerated well.  We will plan for dialysis treatment again tomorrow to maintain outpatient schedule.  Appreciate renal navigator confirming outpatient clinic at [facility] on a MWF schedule, supervision by Dr. Holley Raring.  2. Anemia of chronic kidney disease Lab Results  Component Value Date   HGB 9.0 (L) 02/21/2022    Hemoglobin at goal  3. Secondary Hyperparathyroidism: with outpatient labs: PTH 236, phosphorus 6.2, calcium 8.7 on October 11, 2021.   Lab Results  Component Value Date   PTH 54 06/11/2020   CALCIUM 8.9 02/21/2022   CAION 1.14 (L) 01/03/2022   PHOS 5.7 (H) 02/21/2022    We will continue to monitor bone minerals and assess need for binders.  Will recommend dietary changes at this time.  4. 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 Home regimen includes glipizide, Lantus and Levemir. Most recent hemoglobin A1c is 7.7 on 02/02/2022.   Glucose stable, primary team to manage SSI   LOS: 3 [place] 11/2/202312:32 PM

## 2022-02-22 NOTE — Progress Notes (Signed)
Initial Nutrition Assessment  DOCUMENTATION CODES:   Not applicable  INTERVENTION:   -Educated pt on renal diet and provided "[place] [employer] with Kidney Disease" handout -Renal MVI daily -Double protein portions with meals  NUTRITION DIAGNOSIS:   Increased nutrient needs related to chronic illness (ESRD on HD) as evidenced by estimated needs.  GOAL:   Patient will meet greater than or equal to 90% of their needs  MONITOR:   PO intake, Supplement acceptance  REASON FOR ASSESSMENT:   Consult Diet education  ASSESSMENT:   Pt with past medical history of diabetes mellitus with renal complications and now stage V chronic kidney disease, secondary anemia, depression and hypertension.  Pt admitted with generalized weakness.   10/30- s/p HD cath placement  Reviewed I/O's: +360 ml x 24 hours and +100 ml since admission   Spoke with pt at bedside, who reports feeling better after HD treatments. He shares that he has a good appetite and ate 100% of his lunch. Noted meal completions 100%.   PTA pt reports good appetite. He usually consumes 2-3 meals per day (Breakfast: cereal; Dinner: meat, starch, and vegetable). Pt's wife cooks for the household, but meal times can be erratic as pt's wife is in graduate school. Pt usually drinks water, lemonade, ginerale, and Pepsi. Pt reports he has been trying to decrease intake of dark colored sodas by getting the "mini" sized cans- he reports "it's amazing how a small can and satisfy your need for a taste of something". RD discussed importance of limiting dark colored sodas due to Phosphorus content.   Pt reports his UBW is around 200-205#. He shares he has lost a significant amount fo weight since January, which he attributes to fluid retention. Weight is now holding between 180-190#.  Reviewed wt hx; pt has experienced a 8.8% wt loss over the past month, which is significant for time frame. Pt shares he has been less  active secondary to falls and weakness, as he is afraid to walk away from his house due to fear of falling.   Pt with outpatient HD center arrangements.  Reviewed food groups and provided written recommended serving sizes specifically determined for patient's current nutritional status.   Explained why diet restrictions are needed and provided lists of foods to limit/avoid that are high potassium, sodium, and phosphorus. Provided specific recommendations on safer alternatives of these foods. Strongly encouraged compliance of this diet.   Discussed importance of protein intake at each meal and snack. Provided examples of how to maximize protein intake throughout the day. Discussed need for fluid restriction with dialysis, importance of minimizing weight gain between HD treatments, and renal-friendly beverage options.  Encouraged pt to discuss specific diet questions/concerns with RD at HD outpatient facility. Teach back method used.  Expect fair to good compliance.  Medications reviewed and include lasix and remeron.   Lab Results  Component Value Date   HGBA1C 8.3 (H) 01/06/2021   PTA DM medications are 20 units insulin determir daily and 5 mg glipizide daily.   Labs reviewed: CBGS: 91-190 (inpatient orders for glycemic control are 10 units insulin determir daily).    NUTRITION - FOCUSED PHYSICAL EXAM:  Flowsheet Row Most Recent Value  Orbital Region No depletion  Upper Arm Region Mild depletion  Thoracic and Lumbar Region No depletion  Buccal Region No depletion  Temple Region No depletion  Clavicle Bone Region Mild depletion  Clavicle and Acromion Bone Region No depletion  Scapular Bone Region No depletion  Dorsal Hand No depletion  Patellar Region Moderate depletion  Anterior Thigh Region Moderate depletion  Posterior Calf Region Moderate depletion  Edema (RD Assessment) None  Hair Reviewed  Eyes Reviewed  Mouth Reviewed  Skin Reviewed  Nails Reviewed       Diet  Order:   Diet Order             Diet renal with fluid restriction Fluid restriction: 1200 mL Fluid; Room service appropriate? Yes; Fluid consistency: Thin  Diet effective now           Diet renal with fluid restriction                   EDUCATION NEEDS:   No education needs have been identified at this time  Skin:  Skin Assessment: Reviewed RN Assessment  Last BM:  02/21/22  Height:   Ht Readings from Last 1 Encounters:  02/20/22 6\' 3"  (1.905 m)    Weight:   Wt Readings from Last 1 Encounters:  02/22/22 81.5 kg    Ideal Body Weight:  89.1 kg  BMI:  Body mass index is 22.46 kg/m.  Estimated Nutritional Needs:   Kcal:  3419-3790  Protein:  125-150 grams  Fluid:  1000 ml + UOP    Loistine Chance, RD, LDN, [place] Registered Dietitian II Certified Diabetes Care and Education Specialist Please refer to [facility] for RD and/or RD on-call/weekend/after hours pager

## 2022-02-22 NOTE — Progress Notes (Signed)
Pt refused to take hydralazine as ordered.  He states "hydralazine has messed up my liver in the past."  Dr. Maryland Pink made aware.

## 2022-02-22 NOTE — Progress Notes (Addendum)
Patient had dialysis done today.  Patient c/o lightheadedness for which this RN responded to.  According to the patient, he stood up and went to the bathroom.  He sat down on the edge of the bed for dinner and felt lightheaded.  Checked patient's BP while sitting 106/58, standing 74/49.  Dr. Maryland Pink made aware, with orders made.  Patient without further complaints as soon lying back down in bed.  Needs addressed.   02/22/22 1735  Assess: MEWS Score  Temp 97.6 F (36.4 C)  BP (!) 74/49  MAP (mmHg) (!) 58  Pulse Rate 79  ECG Heart Rate 72  Resp 18  Level of Consciousness Alert  SpO2 100 %  O2 Device Room Air  Patient Activity (if Appropriate) Other (Comment) (sitting on edge of bed for dinner before standing up for the BP)  Assess: MEWS Score  MEWS Temp 0  MEWS Systolic 2  MEWS Pulse 0  MEWS RR 0  MEWS LOC 0  MEWS Score 2  MEWS Score Color Yellow  Assess: if the MEWS score is Yellow or Red  Were vital signs taken at a resting state? Yes  Focused Assessment No change from prior assessment  Does the patient meet 2 or more of the SIRS criteria? No  MEWS guidelines implemented *See Row Information* No, vital signs rechecked  Treat  MEWS Interventions Other (Comment) (Dr. Maryland Pink made aware, patient lied back down in bed, BP recheck 137/77.)  Pain Scale 0-10  Pain Score 0  Notify: Charge Nurse/RN  Name of Charge Nurse/RN Notified Systems developer  Date Charge Nurse/RN Notified 02/22/22  Time Charge Nurse/RN Notified 1758  Notify: Provider  Provider Name/Title Dr. Maryland Pink  Date Provider Notified 02/22/22  Time Provider Notified 1759  Method of Notification Call  Notification Reason Other (Comment) (dropped in BP)  Provider response In department;See new orders  Date of Provider Response 02/22/22  Time of Provider Response 1759  Notify: Rapid Response  Name of Rapid Response RN Notified Not applicable  Document  Patient Outcome Other (Comment) (Pt has been stable, no  distress at this time.)  Progress note created (see row info) Yes  Assess: SIRS CRITERIA  SIRS Temperature  0  SIRS Pulse 0  SIRS Respirations  0  SIRS WBC 1  SIRS Score Sum  1

## 2022-02-22 NOTE — H&P (View-Only) (Signed)
[facility]  Daily Progress Note   Subjective  -   Patient doing well with dialysis following temp cath placement.  He does note some lightheadedness at times.  Recent noninvasive studies to the patient fistula reveals good flow volume however there is evidence of a significant venous stenosis.  Objective Vitals:   02/22/22 1725 02/22/22 1735 02/22/22 1741 02/22/22 1944  BP: 106/64 (!) 74/49 137/77 (!) 164/78  Pulse: 71 79 71 76  Resp: 18 18 18 18   Temp:  97.6 F (36.4 C) 97.6 F (36.4 C) 98.5 F (36.9 C)  TempSrc:  Oral Oral Oral  SpO2: 100% 100% 100% 100%  Weight:      Height:        Intake/Output Summary (Last 24 hours) at 02/22/2022 2311 Last data filed at 02/22/2022 1200 Gross per 24 hour  Intake 240 ml  Output 0.5 ml  Net 239.5 ml    PULM  CTAB CV  RRR VASC  good thrill and bruit of the left brachiocephalic AV fistula  Laboratory CBC    Component Value Date/Time   WBC 11.0 (H) 02/21/2022 0426   HGB 9.0 (L) 02/21/2022 0426   HCT 27.9 (L) 02/21/2022 0426   PLT 292 02/21/2022 0426    BMET    Component Value Date/Time   NA 142 02/21/2022 0426   K 4.0 02/21/2022 0426   CL 107 02/21/2022 0426   CO2 26 02/21/2022 0426   GLUCOSE 90 02/21/2022 0426   BUN 62 (H) 02/21/2022 0426   CREATININE 6.42 (H) 02/21/2022 0426   CALCIUM 8.9 02/21/2022 0426   GFRNONAA 10 (L) 02/21/2022 0426    Assessment/Planning: Noninvasive studies of the patient's dialysis access noted that while the flow volume was adequate there is concerning stricture in the venous outflow.  Because of this I have discussed with the patient and family that it would be in his best interest to move forward with a fistulogram in order to try to correct the stenosis as well as to have a PermCath placed.  Following fistulogram he can utilize his dialysis access however as this will be the first use, we feel will be prudent to have the patient discharged with a PermCath in case there  are ongoing issues with the initiation of his dialysis access.  We will plan on removing PermCath on an outpatient basis once his dialysis center feels that his permanent access is stable. Kris Hartmann  02/22/2022, 11:11 PM

## 2022-02-22 NOTE — Progress Notes (Incomplete)
Pt completed tx without difficulty.  Removed 0.5L of fluid and processed 52.9L of blood.  B/p 199/98 and reported to Floor RN

## 2022-02-22 NOTE — Discharge Planning (Signed)
PLACEMENT RESOLVED Outpatient facility:  [facility]  [place], [place] [address] [phone]  Treatment days: Monday, Wednesday and Friday  Chair time: 10:35am  Start date: 11/6 at 9:30am to fill out paperwork  Time confirmed with [employer] AA. Patient is aware of schedule and start date. Confirmed that father is going to transport.   Elvera Bicker Dialysis Coordinator [phone]

## 2022-02-22 NOTE — Progress Notes (Signed)
Pre hd rn assessment 

## 2022-02-22 NOTE — Progress Notes (Signed)
[facility]  Daily Progress Note   Subjective  -   Patient doing well with dialysis following temp cath placement.  He does note some lightheadedness at times.  Recent noninvasive studies to the patient fistula reveals good flow volume however there is evidence of a significant venous stenosis.  Objective Vitals:   02/22/22 1725 02/22/22 1735 02/22/22 1741 02/22/22 1944  BP: 106/64 (!) 74/49 137/77 (!) 164/78  Pulse: 71 79 71 76  Resp: 18 18 18 18   Temp:  97.6 F (36.4 C) 97.6 F (36.4 C) 98.5 F (36.9 C)  TempSrc:  Oral Oral Oral  SpO2: 100% 100% 100% 100%  Weight:      Height:        Intake/Output Summary (Last 24 hours) at 02/22/2022 2311 Last data filed at 02/22/2022 1200 Gross per 24 hour  Intake 240 ml  Output 0.5 ml  Net 239.5 ml    PULM  CTAB CV  RRR VASC  good thrill and bruit of the left brachiocephalic AV fistula  Laboratory CBC    Component Value Date/Time   WBC 11.0 (H) 02/21/2022 0426   HGB 9.0 (L) 02/21/2022 0426   HCT 27.9 (L) 02/21/2022 0426   PLT 292 02/21/2022 0426    BMET    Component Value Date/Time   NA 142 02/21/2022 0426   K 4.0 02/21/2022 0426   CL 107 02/21/2022 0426   CO2 26 02/21/2022 0426   GLUCOSE 90 02/21/2022 0426   BUN 62 (H) 02/21/2022 0426   CREATININE 6.42 (H) 02/21/2022 0426   CALCIUM 8.9 02/21/2022 0426   GFRNONAA 10 (L) 02/21/2022 0426    Assessment/Planning: Noninvasive studies of the patient's dialysis access noted that while the flow volume was adequate there is concerning stricture in the venous outflow.  Because of this I have discussed with the patient and family that it would be in his best interest to move forward with a fistulogram in order to try to correct the stenosis as well as to have a PermCath placed.  Following fistulogram he can utilize his dialysis access however as this will be the first use, we feel will be prudent to have the patient discharged with a PermCath in case there  are ongoing issues with the initiation of his dialysis access.  We will plan on removing PermCath on an outpatient basis once his dialysis center feels that his permanent access is stable. Kris Hartmann  02/22/2022, 11:11 PM

## 2022-02-23 ENCOUNTER — Encounter
Admission: EM | Disposition: A | Discharge status: Home / Self Care | Payer: Self-pay | Source: Home / Self Care | Attending: Internal Medicine

## 2022-02-23 DIAGNOSIS — N186 End stage renal disease: Secondary | ICD-10-CM | POA: Diagnosis not present

## 2022-02-23 DIAGNOSIS — N19 Unspecified kidney failure: Secondary | ICD-10-CM | POA: Diagnosis not present

## 2022-02-23 DIAGNOSIS — E1121 Type 2 diabetes mellitus with diabetic nephropathy: Secondary | ICD-10-CM | POA: Diagnosis not present

## 2022-02-23 DIAGNOSIS — R936 Abnormal findings on diagnostic imaging of limbs: Secondary | ICD-10-CM

## 2022-02-23 DIAGNOSIS — R531 Weakness: Secondary | ICD-10-CM | POA: Diagnosis not present

## 2022-02-23 HISTORY — PX: A/V FISTULAGRAM: CATH118298

## 2022-02-23 HISTORY — PX: DIALYSIS/PERMA CATHETER INSERTION: CATH118288

## 2022-02-23 LAB — GLUCOSE, CAPILLARY
Glucose-Capillary: 113 mg/dL — ABNORMAL HIGH (ref 70–99)
Glucose-Capillary: 110 mg/dL — ABNORMAL HIGH (ref 70–99)
Glucose-Capillary: 115 mg/dL — ABNORMAL HIGH (ref 70–99)
Glucose-Capillary: 137 mg/dL — ABNORMAL HIGH (ref 70–99)

## 2022-02-23 SURGERY — A/V FISTULAGRAM
Anesthesia: Moderate Sedation

## 2022-02-23 MED ORDER — RENA-VITE PO TABS: 1.0000 | ORAL_TABLET | Freq: Every day | ORAL | 1 refills | Status: DC

## 2022-02-23 MED ORDER — CEFAZOLIN SODIUM-DEXTROSE 1-4 GM/50ML-% IV SOLN: 1.0000 g | INTRAVENOUS | Status: AC

## 2022-02-23 MED ORDER — FENTANYL CITRATE (PF) 100 MCG/2ML IJ SOLN
INTRAMUSCULAR | Status: DC | PRN
  Administered 2022-02-23: 25 ug via INTRAVENOUS
  Administered 2022-02-23: 50 ug via INTRAVENOUS
  Administered 2022-02-23: 25 ug via INTRAVENOUS

## 2022-02-23 MED ORDER — FAMOTIDINE 20 MG PO TABS: 40.0000 mg | ORAL_TABLET | Freq: Once | ORAL | Status: DC | PRN

## 2022-02-23 MED ORDER — HEPARIN SODIUM (PORCINE) 1000 UNIT/ML IJ SOLN
INTRAMUSCULAR | Status: AC
  Filled 2022-02-23: qty 10

## 2022-02-23 MED ORDER — MIDAZOLAM HCL 2 MG/2ML IJ SOLN
INTRAMUSCULAR | Status: DC | PRN
  Administered 2022-02-23: .5 mg via INTRAVENOUS
  Administered 2022-02-23: 1 mg via INTRAVENOUS
  Administered 2022-02-23: .5 mg via INTRAVENOUS

## 2022-02-23 MED ORDER — METHYLPREDNISOLONE SODIUM SUCC 125 MG IJ SOLR: 125.0000 mg | Freq: Once | INTRAMUSCULAR | Status: DC | PRN

## 2022-02-23 MED ORDER — MIDAZOLAM HCL 2 MG/2ML IJ SOLN
INTRAMUSCULAR | Status: AC
  Filled 2022-02-23: qty 4

## 2022-02-23 MED ORDER — CLONIDINE HCL 0.3 MG PO TABS: 0.3000 mg | ORAL_TABLET | Freq: Two times a day (BID) | ORAL | 11 refills | Status: AC

## 2022-02-23 MED ORDER — HEPARIN SODIUM (PORCINE) 10000 UNIT/ML IJ SOLN
INTRAMUSCULAR | Status: AC
  Filled 2022-02-23: qty 1

## 2022-02-23 MED ORDER — DIPHENHYDRAMINE HCL 50 MG/ML IJ SOLN: 50.0000 mg | Freq: Once | INTRAMUSCULAR | Status: DC | PRN

## 2022-02-23 MED ORDER — LOSARTAN POTASSIUM 100 MG PO TABS: 100.0000 mg | ORAL_TABLET | Freq: Every day | ORAL | 1 refills | Status: DC

## 2022-02-23 MED ORDER — CEFAZOLIN SODIUM-DEXTROSE 1-4 GM/50ML-% IV SOLN
INTRAVENOUS | Status: AC
  Administered 2022-02-23: 1 g via INTRAVENOUS
  Filled 2022-02-23: qty 50

## 2022-02-23 MED ORDER — MIDAZOLAM HCL 2 MG/ML PO SYRP: 8.0000 mg | ORAL_SOLUTION | Freq: Once | ORAL | Status: DC | PRN

## 2022-02-23 MED ORDER — HYDROMORPHONE HCL 1 MG/ML IJ SOLN
1.0000 mg | Freq: Once | INTRAMUSCULAR | Status: AC | PRN
  Administered 2022-02-23: 1 mg via INTRAVENOUS
  Filled 2022-02-23: qty 1

## 2022-02-23 MED ORDER — ONDANSETRON HCL 4 MG/2ML IJ SOLN: 4.0000 mg | Freq: Four times a day (QID) | INTRAMUSCULAR | Status: DC | PRN

## 2022-02-23 MED ORDER — INSULIN DETEMIR 100 UNIT/ML ~~LOC~~ SOLN: 10.0000 [IU] | Freq: Every day | SUBCUTANEOUS | 11 refills | Status: DC

## 2022-02-23 MED ORDER — IODIXANOL 320 MG/ML IV SOLN
INTRAVENOUS | Status: DC | PRN
  Administered 2022-02-23: 15 mL

## 2022-02-23 MED ORDER — CARVEDILOL 12.5 MG PO TABS: 12.5000 mg | ORAL_TABLET | Freq: Two times a day (BID) | ORAL | 1 refills | Status: DC

## 2022-02-23 MED ORDER — SODIUM CHLORIDE 0.9 % IV SOLN: INTRAVENOUS | Status: DC

## 2022-02-23 MED ORDER — MIDODRINE HCL 5 MG PO TABS: 5.0000 mg | ORAL_TABLET | ORAL | Status: DC

## 2022-02-23 MED ORDER — MIDODRINE HCL 5 MG PO TABS: 5.0000 mg | ORAL_TABLET | ORAL | 1 refills | Status: DC

## 2022-02-23 MED ORDER — FENTANYL CITRATE (PF) 100 MCG/2ML IJ SOLN
INTRAMUSCULAR | Status: AC
  Filled 2022-02-23: qty 2

## 2022-02-23 SURGICAL SUPPLY — 16 items
BIOPATCH RED 1 DISK 7.0 (GAUZE/BANDAGES/DRESSINGS) IMPLANT
CANNULA 5F STIFF (CANNULA) IMPLANT
CATH PALIN MAXID VT KIT 19CM (CATHETERS) IMPLANT
COVER PROBE ULTRASOUND 5X96 (MISCELLANEOUS) IMPLANT
DERMABOND ADVANCED .7 DNX12 (GAUZE/BANDAGES/DRESSINGS) IMPLANT
DRAPE BRACHIAL (DRAPES) IMPLANT
DRAPE INCISE 23X17 IOBAN STRL (DRAPES) ×1
DRAPE INCISE 23X17 STRL (DRAPES) IMPLANT
DRAPE INCISE IOBAN 23X17 STRL (DRAPES) ×1 IMPLANT
GOWN STRL REUS W/ TWL LRG LVL3 (GOWN DISPOSABLE) ×1 IMPLANT
GOWN STRL REUS W/TWL LRG LVL3 (GOWN DISPOSABLE) ×1
PACK ANGIOGRAPHY (CUSTOM PROCEDURE TRAY) ×1 IMPLANT
SHEATH BRITE TIP 6FRX5.5 (SHEATH) IMPLANT
SUT MNCRL AB 4-0 PS2 18 (SUTURE) IMPLANT
SUT PROLENE 0 CT 1 30 (SUTURE) IMPLANT
SUT SILK 0 FSL (SUTURE) IMPLANT

## 2022-02-23 NOTE — Progress Notes (Signed)
Pt completed 3 hour HD treatment. Experienced low bp, cramps and dizziness w/ HD. Currently alert, vss and c/o pain at site of catheter placement pre HD. Report to primary RN. Start: 1420 End: 1721 No fluid removed for HD 81.9kg standing post hd weight 62L BVP Pt received Dilaudid 1mg  w/ HD. Given w/ primary RN and HD RN.

## 2022-02-23 NOTE — Interval H&P Note (Signed)
History and Physical Interval Note:  02/23/2022 1:13 PM  Bradley Lopez  has presented today for surgery, with the diagnosis of end stage renal disease.  The various methods of treatment have been discussed with the patient and family. After consideration of risks, benefits and other options for treatment, the patient has consented to  Procedure(s): A/V Fistulagram (N/A) DIALYSIS/PERMA CATHETER INSERTION (N/A) as a surgical intervention.  The patient's history has been reviewed, patient examined, no change in status, stable for surgery.  I have reviewed the patient's chart and labs.  Questions were answered to the patient's satisfaction.     Hortencia Pilar

## 2022-02-23 NOTE — Progress Notes (Signed)
Patient is experiencing cramping in his side, as well as discomfort at the insert site of his recently placed cvc. 129ml ns was given to help cramping. Uf remains off at this time and will continue to monitor closely.

## 2022-02-23 NOTE — Discharge Summary (Signed)
Physician Discharge Summary   Patient: Bradley Lopez MRN: [id number] DOB: December 29, 1975  Admit date:     02/19/2022  Discharge date: 02/23/22  Discharge Physician: Annita Brod   PCP: Baxter Hire, MD   Recommendations at discharge:   New medication: Coreg 12.5 p.o. twice daily Medication change: Clonidine tablet changed to 0.3 mg p.o. twice daily Patient will go to dialysis on Monday/Wednesday/Friday Medication change: Levemir decreased to 10 units subcu daily Medication change: Zestril discontinued New medication: Cozaar 100 mg p.o. daily Medication change: Zaroxolyn 5 mg p.o. daily, patient previously taking twice a week Medication change: Lopressor discontinued. New medication: Midodrine 5 mg p.o. 3 times daily with hemodialysis New medication: Rena-Vite p.o. nightly Medication clarification: Patient previously had not been taking Restoril, K-Lor, Maxitrol, Vigamox, Lantus, Norco or hydralazine.  These medications are formally discontinued.  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 *  Hospital Course: Patient is a 46 year old male with past medical history of diabetes mellitus with renal complications and now stage V chronic kidney disease, secondary anemia, depression and hypertension.  Over the past 1 year, patient's creatinine has worsened from 1.96 to most recently 8.20.  Last month, patient had an AV fistula placed.  He started having weakness, dizziness and nausea for the past 2 weeks as well as lower extremity swelling and periorbital edema, times which were felt to be secondary to uremia from end-stage renal disease and patient was  sent over to the emergency room on 10/30 by his nephrologist for initiation of dialysis.  Patient was admitted to the hospitalist service.  Vascular surgery placed temporary femoral dialysis catheter on 10/30 and patient has been getting daily dialysis.  Ultrasound noted possible stenosis of fistula, so on 11/3, patient underwent placement of tunneled catheter and fistulogram which determined no evidence of stenosis in fistula and felt to be patent.  Assessment and Plan: * Generalized weakness Secondary to worsening renal function with some uremia. Expect improvement in patient's symptoms with renal replacement therapy.    ESRD (end stage renal disease) ([place]) Temporary dialysis catheter placed 10/30 by vascular surgery.  Patient's stage V chronic kidney disease has progressed now to end-stage.  Questionable fistula stenosis seen on ultrasound, so patient underwent fistulogram on 11/3 which noted no evidence of stenosis and fistula felt to be patent.  Total PermCath placed as well which will be used for dialysis until fistula is ready.    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  Continue diuretics with furosemide and metolazone. Continue atorvastatin.  In addition, volume is also being managed by dialysis  Uremia Secondary to worsening renal function and accounts for patient's symptoms.  Should improve with dialysis.  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  Hold glipizide  Essential hypertension Patient is on multiple antihypertensive medications which include lisinopril, amlodipine, metoprolol and clonidine. Monitor patient closely for bradycardia since he is on metoprolol and clonidine.  At  times, patient is spiking blood pressure and attempted to treat with hydralazine.  Anemia due to chronic kidney disease Hemoglobin is 8 and appears to be patient's baseline Follow-up with hematology as an outpatient for iron infusions  Depression Continue mirtazapine         Consultants: Nephrology Vascular surgery   Procedures: Placement temporary dialysis femoral catheter 10/30 Placement of tunneled dialysis catheter 11/3 Evaluation of fistula by fistulogram-no stenosis noted  Disposition: Home Diet recommendation:  Discharge Diet Orders (From admission, onward)     Start     Ordered   02/23/22 0000  Diet - low sodium heart healthy        02/23/22 1808   02/19/22 0000  Diet renal with fluid restriction        02/19/22 1845           Renal diet DISCHARGE MEDICATION: Allergies as of 02/23/2022       Reactions   Pine Hives, Itching        Medication List     STOP taking these medications    hydrALAZINE 50 MG tablet Commonly known as: APRESOLINE   HYDROcodone-acetaminophen 5-325 MG tablet Commonly known as: Norco   insulin glargine 100 UNIT/ML injection Commonly known as: LANTUS   lisinopril 20 MG tablet Commonly known as: ZESTRIL   metoprolol tartrate 50 MG tablet Commonly known as: LOPRESSOR   moxifloxacin 0.5 % ophthalmic solution Commonly known as: VIGAMOX   neomycin-polymyxin b-dexamethasone 3.5-10000-0.1 Oint Commonly known as: MAXITROL   potassium chloride 10 MEQ tablet Commonly known as: KLOR-CON   temazepam 15 MG capsule Commonly known as: RESTORIL       TAKE these medications    amLODipine 10 MG tablet Commonly known as: NORVASC Take 1 tablet (10 mg total) by mouth daily.   aspirin EC 81 MG tablet Take 1 tablet (81 mg total) by mouth daily. Swallow whole.   atorvastatin 80 MG tablet Commonly known as: LIPITOR Take 1 tablet (80 mg total) by mouth daily.   calcitRIOL 0.5 MCG capsule Commonly known as:  ROCALTROL Take 0.5 mcg by mouth daily.   carvedilol 12.5 MG tablet Commonly known as: COREG Take 1 tablet (12.5 mg total) by mouth 2 (two) times daily.   cloNIDine 0.3 MG tablet Commonly known as: CATAPRES Take 1 tablet (0.3 mg total) by mouth 2 (two) times daily. What changed:  medication strength how much to take when to take this additional instructions   furosemide 40 MG tablet Commonly known as: LASIX Take 1 tablet (40 mg total) by mouth 2 (two) times daily.   glipiZIDE 5 MG 24 hr tablet Commonly known as: GLUCOTROL XL Take 5 mg by mouth daily.   insulin detemir 100 UNIT/ML injection Commonly known as: LEVEMIR Inject 0.1 mLs (10 Units total) into the skin daily after breakfast. What changed: how much to take   losartan 100 MG tablet Commonly known as: COZAAR Take 1 tablet (100 mg total) by mouth daily. Start taking on: February 24, 2022   metolazone 5 MG tablet Commonly known as: ZAROXOLYN Take 5 mg by mouth daily.   midodrine 5 MG tablet Commonly known as: PROAMATINE Take 1 tablet (5 mg total) by mouth every Monday, Wednesday, and Friday with hemodialysis. Start taking on: February 26, 2022   mirtazapine 15 MG tablet Commonly known as: REMERON Take 15 mg by mouth at bedtime.   multivitamin Tabs tablet Take 1 tablet by mouth at bedtime.   prednisoLONE acetate 1 % ophthalmic suspension Commonly  known as: PRED FORTE SMARTSIG:In Eye(s)   sevelamer carbonate 800 MG tablet Commonly known as: RENVELA Take 800 mg by mouth 2 (two) times daily with a meal.   ursodiol 300 MG capsule Commonly known as: ACTIGALL Take by mouth.        Discharge Exam: Filed Weights   02/22/22 0973 02/22/22 1227 02/23/22 1729  Weight: 82.2 kg 81.5 kg 81.9 kg   General: Alert and oriented x3, no acute distress Cardiovascular: Regular rate and rhythm, S1-S2 Lungs: Clear to auscultation bilaterally  Condition at discharge: good  The results of significant diagnostics from  this hospitalization (including imaging, microbiology, ancillary and laboratory) are listed below for reference.   Imaging Studies: PERIPHERAL VASCULAR CATHETERIZATION  Result Date: 02/23/2022 See surgical note for result.  [place] Dialysis Access  Result Date: 02/20/2022 CLINICAL DATA:  46 year old male with a history dialysis circuit for hemodialysis EXAM: ULTRASOUND DIALYSIS ACCESS TECHNIQUE: Directed duplex performed of the dialysis circuit left upper extremity COMPARISON:  None Available. FINDINGS: Inflow artery velocity: 213 centimeter/second. Low resistance waveform without spectral broadening Arterial anastomosis velocity: 300 centimeter/second The venous outflow demonstrates a maximum velocity of 745 centimeter/second in the proximal venous outflow, which is greater than 3 times the velocity of the arterial inflow (ratio approaching 3.5). 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 Nadene Rubins, RPVI [facility] Radiology Electronically Signed   By: Corrie Mckusick D.O.   On: 02/20/2022 12:05   PERIPHERAL VASCULAR CATHETERIZATION  Result Date: 02/19/2022 See surgical note for result.  DG Chest 1 View  Result Date: 02/19/2022 CLINICAL DATA:  Shortness of breath, dizziness EXAM: CHEST  1 VIEW COMPARISON:  06/10/2020 FINDINGS: The heart size and mediastinal contours are within normal limits. Both lungs are clear. The visualized skeletal structures are unremarkable. IMPRESSION: No active disease. Electronically Signed   By: Elmer Picker M.D.   On: 02/19/2022 09:28    Microbiology: Results for orders placed or performed during the hospital encounter of 02/19/22  Resp Panel by RT-PCR (Flu  A&B, Covid) Anterior Nasal Swab     Status: None   Collection Time: 02/19/22 10:21 AM   Specimen: Anterior Nasal Swab  Result Value Ref Range Status   SARS Coronavirus 2 by RT PCR NEGATIVE NEGATIVE Final    Comment: (NOTE) SARS-CoV-2 target nucleic acids are NOT DETECTED.  The SARS-CoV-2 RNA is generally detectable in upper respiratory specimens during the acute phase of infection. The lowest concentration of SARS-CoV-2 viral copies this assay can detect is 138 copies/mL. A negative result does not preclude SARS-Cov-2 infection and should not be used as the sole basis for treatment or other patient management decisions. A negative result may occur with  improper specimen collection/handling, submission of specimen other than nasopharyngeal swab, presence of viral mutation(s) within the areas targeted by this assay, and inadequate number of viral copies(<138 copies/mL). A negative result must be combined with clinical observations, patient history, and epidemiological information. The expected result is Negative.  Fact Sheet for Patients:  EntrepreneurPulse.com.au  Fact Sheet for Healthcare Providers:  IncredibleEmployment.be  This test is no t yet approved or cleared by the [place] FDA and  has been authorized for detection and/or diagnosis of SARS-CoV-2 by FDA under an Emergency Use Authorization (EUA). This EUA will remain  in effect (meaning this test can  be used) for the duration of the COVID-19 declaration under Section 564(b)(1) of the Act, 21 U.S.C.section 360bbb-3(b)(1), unless the authorization is terminated  or revoked sooner.       Influenza A by PCR NEGATIVE NEGATIVE Final   Influenza B by PCR NEGATIVE NEGATIVE Final    Comment: (NOTE) The Xpert Xpress SARS-CoV-2/FLU/RSV plus assay is intended as an aid in the diagnosis of influenza from Nasopharyngeal swab specimens and should not be used as a sole basis for treatment.  Nasal washings and aspirates are unacceptable for Xpert Xpress SARS-CoV-2/FLU/RSV testing.  Fact Sheet for Patients: EntrepreneurPulse.com.au  Fact Sheet for Healthcare Providers: IncredibleEmployment.be  This test is not yet approved or cleared by the [place] FDA and has been authorized for detection and/or diagnosis of SARS-CoV-2 by FDA under an Emergency Use Authorization (EUA). This EUA will remain in effect (meaning this test can be used) for the duration of the COVID-19 declaration under Section 564(b)(1) of the Act, 21 U.S.C. section 360bbb-3(b)(1), unless the authorization is terminated or revoked.  Performed at [facility], [address] [place], [place] [address]     Labs: CBC: Recent Labs  Lab 02/19/22 0844 02/20/22 1420 02/21/22 0426  WBC 12.5* 11.1* 11.0*  NEUTROABS  --  7.2 6.4  HGB 8.0* 8.3* 9.0*  HCT 24.5* 26.0* 27.9*  MCV 90.7 90.9 90.6  PLT 283 282 948   Basic Metabolic Panel: Recent Labs  Lab 02/19/22 0844 02/20/22 1420 02/21/22 0426  NA 139 140 142  K 4.5 4.3 4.0  CL 105 108 107  CO2 23 23 26   GLUCOSE 169* 138* 90  BUN 80* 79* 62*  CREATININE 8.20* 8.08* 6.42*  CALCIUM 9.1 8.7* 8.9  PHOS  --  7.3* 5.7*   Liver Function Tests: Recent Labs  Lab 02/20/22 1420 02/21/22 0426  ALBUMIN 3.3* 3.5   CBG: Recent Labs  Lab 02/22/22 1612 02/23/22 0851 02/23/22 1121 02/23/22 1341 02/23/22 1751  GLUCAP 165* 113* 110* 115* 137*    Discharge time spent: less than 30 minutes.  Signed: Annita Brod, MD [facility] 02/23/2022

## 2022-02-23 NOTE — Progress Notes (Signed)
Pt c/o cramps, 154ml NS bolus given, uf remains off for remainder of HD treatment. Holley Raring, MD notified. Will continue to monitor.

## 2022-02-23 NOTE — Progress Notes (Signed)
Patient experienced high arterial pressures upon initiation attempted repositioning and lowering bfr with no success, paused treatment flushed lumens and reversed lines was able to maintain stable pressures at a 350 bfr NP was made aware.

## 2022-02-23 NOTE — Progress Notes (Signed)
Uf off at this time, patient complaints of being dizzy and light headed. No fluid given at this time. NP was made aware and RN Terence Lux. Will continue to monitor closely.

## 2022-02-23 NOTE — Progress Notes (Signed)
Received consult to remove femoral [place]. No order found in medical record. Secure chat sent to RN.

## 2022-02-23 NOTE — Progress Notes (Signed)
Noted increase in patient blood pressure with saline given, patient denies cramping at this time just pain and burning at the insert site of his cvc. Uf remains off at this time and will continue to monitor closely.

## 2022-02-23 NOTE — Op Note (Signed)
OPERATIVE NOTE   PROCEDURE: Insertion of tunneled dialysis catheter right IJ approach with ultrasound and fluoroscopic guidance. Contrast injection left brachiocephalic fistula. Ultrasound guided access to the fistula as well as the internal jugular vein.  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  ANESTHESIA: Conscious sedation was administered under my direct supervision by the interventional radiology RN. IV Versed plus fentanyl were utilized. Continuous ECG, pulse oximetry and blood pressure was monitored throughout the entire procedure. Conscious sedation was for a total of 47 minutes and 57 seconds.  ESTIMATED BLOOD LOSS: Minimal cc  CONTRAST USED: 15 cc  FLUOROSCOPY TIME: 0.9 minutes  INDICATIONS:   Bradley Lopez a 46 y.o. y.o. male who presents with worsening of his renal function.  He recently underwent placement of a brachiocephalic fistula but this has not yet matured to the point that it can be used.  Unfortunately his renal function has deteriorated and he now requires urgent start to his hemodialysis.  Temporary catheter was placed and he has been dialyzed several times.  He is now undergoing placement of a tunneled catheter so that he may be discharged home and continue his dialysis therapy.  During this admission duplex ultrasound was obtained which was reported as a critical stenosis.  He is therefore undergoing contrast injection of his fistula to ensure that there is not a critical lesion.  Risk and benefits of been reviewed all questions answered patient agrees to proceed  DESCRIPTION: After obtaining full informed written consent, the patient was positioned supine with the arm extended palm up.  Ultrasound was  placed in a sterile sleeve.  The fistula is evaluated with ultrasound near the antecubital fossa.  It was noted to be echolucent and compressible.  Lidocaine is of treating the soft tissues and under direct ultrasound visualization after an image is recorded for the permanent record microneedle is inserted.  Microwire was advanced.  The micro sheath was then inserted and stopcock placed.  Hand-injection of contrast was then used to evaluate the fistula and the central veins.  They are all widely patent.  There is no evidence of a stricture or stenosis.  Pursestring suture of 4-0 Monocryl is then placed around the microsheath and the microsheath removed and the suture is secured.  Attention is then turned to the tunnel catheter.   The right neck and chest wall was prepped and draped in a sterile fashion. Ultrasound was placed in a sterile sleeve. Ultrasound was utilized to identify the right internal jugular vein which is noted to be echolucent and compressible indicating patency. Image is recorded for the permanent record. Under direct ultrasound visualization a micro-needle is inserted into the vein followed by the micro-wire. Micro-sheath was then advanced and a J wire is inserted without difficulty under fluoroscopic guidance. Small counterincision was made at the wire insertion site. Dilators are passed over the wire and the peel-away sheath is inserted.  The catheter is then approximated to the chest wall and an exit site selected. 1% lidocaine is infiltrated in soft tissues at this level small incision is made and the tunneling device is then passed from the exit site to the right neck counterincision.  And passed through the peel-away sheath into the venous system.  Fluoroscopy was then used to adjust the catheter for smooth contour so that the tips are located  at the atrial caval junction.  Both lumens aspirate and flush easily. After verification of smooth contour with proper tip position under  fluoroscopy the catheter is packed with 5000 units of heparin per lumen.  Catheter secured to the skin of the right chest wall with 0 silk. A sterile dressing is applied with a Biopatch.  COMPLICATIONS: None  CONDITION: Good  Hortencia Pilar [facility]. Office:  [phone]   02/23/2022,1:26 PM

## 2022-02-23 NOTE — Progress Notes (Signed)
Uf remains off, patient is still feeling slightly dizzy. Monitoring closely.

## 2022-02-23 NOTE — Progress Notes (Signed)
Post hd rn assessment 

## 2022-02-23 NOTE — Progress Notes (Signed)
Pt uf off d/t feeling light headed and dizzy. Sherlyn Hay, NP present and aware. Pt resting. Vss. Will continue to monitor.

## 2022-02-23 NOTE — Progress Notes (Addendum)
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  Patient presents to the emergency department with complaints of weakness and fatigue.  Patient will be admitted for Generalized weakness [R53.1] Other fatigue [R53.83] Chronic kidney disease, unspecified CKD stage [N18.9]  Patient is known to our practice and is followed outpatient by Dr. Lanora Manis.  Patient was last seen in the office on February 14, 2022 where it was discussed with patient his advanced renal failure and the need to proceed with dialysis.   Patient seen resting quietly in bed, no family at bedside Alert and oriented Currently n.p.o. for scheduled vascular procedure Complains of increased dizziness, more than he experiences at home  Objective:  Vital signs in last 24 hours:  Temp:  [97.4 F (36.3 C)-98.5 F (36.9 C)] 97.4 F (36.3 C) (11/03 1038) Pulse Rate:  [62-79] 67 (11/03 1309) Resp:  [0-20] 15 (11/03 1330) BP: (74-172)/(49-88) 172/88 (11/03 1330) SpO2:  [97 %-100 %] 100 % (11/03 1330)  Weight change: -1.7 kg Filed Weights   02/21/22 1410 02/22/22 0839 02/22/22 1227  Weight: 83.9 kg 82.2 kg 81.5 kg    Intake/Output: I/O last 3 completed shifts: In: 240 [P.O.:240] Out: 0.5 [Other:0.5]   Intake/Output this shift:  No intake/output data recorded.  Physical Exam: General: NAD, resting comfortably  Head: Normocephalic, atraumatic. Moist oral mucosal membranes  Eyes: Anicteric  Lungs:  Clear to auscultation, normal effort, room air  Heart: Regular rate and rhythm  Abdomen:  Soft, nontender, nondistended  Extremities: No peripheral edema.  Neurologic: Nonfocal, moving all four extremities  Skin: No lesions  Access: Left upper aVF (maturing), Rt femoral temp cath    Basic Metabolic  Panel: Recent Labs  Lab 02/19/22 0844 02/20/22 1420 02/21/22 0426  NA 139 140 142  K 4.5 4.3 4.0  CL 105 108 107  CO2 23 23 26   GLUCOSE 169* 138* 90  BUN 80* 79* 62*  CREATININE 8.20* 8.08* 6.42*  CALCIUM 9.1 8.7* 8.9  PHOS  --  7.3* 5.7*     Liver Function Tests: Recent Labs  Lab 02/20/22 1420 02/21/22 0426  ALBUMIN 3.3* 3.5    No results for input(s): "LIPASE", "AMYLASE" in the last 168 hours. No results for input(s): "AMMONIA" in the last 168 hours.  CBC: Recent Labs  Lab 02/19/22 0844 02/20/22 1420 02/21/22 0426  WBC 12.5* 11.1* 11.0*  NEUTROABS  --  7.2 6.4  HGB 8.0* 8.3* 9.0*  HCT 24.5* 26.0* 27.9*  MCV 90.7 90.9 90.6  PLT 283 282 292     Cardiac Enzymes: No results for input(s): "CKTOTAL", "CKMB", "CKMBINDEX", "TROPONINI" in the last 168 hours.  BNP: Invalid input(s): "POCBNP"  CBG: Recent Labs  Lab 02/22/22 1301 02/22/22 1612 02/23/22 0851 02/23/22 1121 02/23/22 1341  GLUCAP 98 165* 113* 110* 115*     Microbiology: Results for orders placed or performed during the hospital encounter of 02/19/22  Resp Panel by RT-PCR (Flu A&B, Covid) Anterior Nasal Swab     Status: None   Collection Time: 02/19/22 10:21 AM   Specimen: Anterior Nasal Swab  Result Value Ref Range Status   SARS Coronavirus 2 by RT PCR NEGATIVE NEGATIVE Final    Comment: (NOTE) SARS-CoV-2 target nucleic acids are NOT DETECTED.  The SARS-CoV-2 RNA is generally detectable in upper respiratory specimens during the acute phase  of infection. The lowest concentration of SARS-CoV-2 viral copies this assay can detect is 138 copies/mL. A negative result does not preclude SARS-Cov-2 infection and should not be used as the sole basis for treatment or other patient management decisions. A negative result may occur with  improper specimen collection/handling, submission of specimen other than nasopharyngeal swab, presence of viral mutation(s) within the areas targeted by this  assay, and inadequate number of viral copies(<138 copies/mL). A negative result must be combined with clinical observations, patient history, and epidemiological information. The expected result is Negative.  Fact Sheet for Patients:  EntrepreneurPulse.com.au  Fact Sheet for Healthcare Providers:  IncredibleEmployment.be  This test is no t yet approved or cleared by the [place] FDA and  has been authorized for detection and/or diagnosis of SARS-CoV-2 by FDA under an Emergency Use Authorization (EUA). This EUA will remain  in effect (meaning this test can be used) for the duration of the COVID-19 declaration under Section 564(b)(1) of the Act, 21 U.S.C.section 360bbb-3(b)(1), unless the authorization is terminated  or revoked sooner.       Influenza A by PCR NEGATIVE NEGATIVE Final   Influenza B by PCR NEGATIVE NEGATIVE Final    Comment: (NOTE) The Xpert Xpress SARS-CoV-2/FLU/RSV plus assay is intended as an aid in the diagnosis of influenza from Nasopharyngeal swab specimens and should not be used as a sole basis for treatment. Nasal washings and aspirates are unacceptable for Xpert Xpress SARS-CoV-2/FLU/RSV testing.  Fact Sheet for Patients: EntrepreneurPulse.com.au  Fact Sheet for Healthcare Providers: IncredibleEmployment.be  This test is not yet approved or cleared by the [place] FDA and has been authorized for detection and/or diagnosis of SARS-CoV-2 by FDA under an Emergency Use Authorization (EUA). This EUA will remain in effect (meaning this test can be used) for the duration of the COVID-19 declaration under Section 564(b)(1) of the Act, 21 U.S.C. section 360bbb-3(b)(1), unless the authorization is terminated or revoked.  Performed at [facility], [address] [place], [place] [address]     Coagulation Studies: No results for input(s): "LABPROT", "INR" in the last  72 hours.  Urinalysis: Recent Labs    02/20/22 2040  COLORURINE STRAW*  LABSPEC 1.007  PHURINE 5.0  GLUCOSEU 50*  HGBUR MODERATE*  BILIRUBINUR NEGATIVE  KETONESUR NEGATIVE  PROTEINUR >=300*  NITRITE NEGATIVE  LEUKOCYTESUR NEGATIVE       Imaging: PERIPHERAL VASCULAR CATHETERIZATION  Result Date: 02/23/2022 See surgical note for result.    Medications:    [MAR Hold] sodium chloride     [MAR Hold] anticoagulant sodium citrate      [MAR Hold] amLODipine  10 mg Oral Daily   [MAR Hold] atorvastatin  80 mg Oral Daily   [MAR Hold] calcitRIOL  0.5 mcg Oral Daily   [MAR Hold] carvedilol  12.5 mg Oral BID   [MAR Hold] Chlorhexidine Gluconate Cloth  6 each Topical Daily   [MAR Hold] cloNIDine  0.3 mg Oral BID   fentaNYL       [MAR Hold] furosemide  40 mg Oral BID   heparin       [MAR Hold] heparin injection (subcutaneous)  5,000 Units Subcutaneous Q8H   heparin sodium (porcine)       [MAR Hold] insulin detemir  10 Units Subcutaneous Daily   [MAR Hold] losartan  100 mg Oral Daily   [MAR Hold] metolazone  5 mg Oral Daily   midazolam       [MAR Hold] mirtazapine  15 mg Oral QHS   [  MAR Hold] multivitamin  1 tablet Oral QHS   [MAR Hold] sodium chloride flush  3 mL Intravenous Q12H   [MAR Hold] ursodiol  300 mg Oral BID   [MAR Hold] sodium chloride, [MAR Hold] acetaminophen **OR** [MAR Hold] acetaminophen, [MAR Hold] alteplase, [MAR Hold] anticoagulant sodium citrate, fentaNYL, heparin, [MAR Hold] heparin, heparin sodium (porcine), HYDROmorphone (DILAUDID) injection, [MAR Hold] lidocaine (PF), [MAR Hold] lidocaine-prilocaine, midazolam, [MAR Hold] ondansetron **OR** [MAR Hold] ondansetron (ZOFRAN) IV, ondansetron (ZOFRAN) IV, [MAR Hold] pentafluoroprop-tetrafluoroeth, [MAR Hold] sodium chloride flush  Assessment/ Plan:  Mr. Bradley Lopez is a 46 y.o.  male with past medical conditions including anemia, hypertension, depression, diabetes, right retinal detachment with nuclear  sclerotic cataract and chronic kidney disease stage V.  Patient presents to the emergency department with complaints of weakness and fatigue.  Patient will be admitted for Generalized weakness [R53.1] Other fatigue [R53.83] Chronic kidney disease, unspecified CKD stage [N18.9]   Chronic kidney disease stage V.  BUN on admission 80.  Uremic symptoms present.  AV fistula placed in September.  Appreciate vascular surgery completing fistulogram today, no stenosis seen however fistula not mature for dialysis.  Vascular surgery has placed a PermCath and will continue to monitor fistula maturation.  Patient will receive dialysis today to maintain outpatient schedule. Will order Midodrine 5mg  on dialysis days only.   If stable, patient cleared to discharge from renal stance after dialysis to continue treatments Monday at assigned clinic.  Appreciate renal navigator confirming outpatient clinic at [facility] on a MWF schedule, supervision by Dr. Holley Raring. Patient instructed to not taking antihypertensives prior to dialysis and take prescribed Midodrine on dialysis days only.   2. Anemia of chronic kidney disease Lab Results  Component Value Date   HGB 9.0 (L) 02/21/2022    Hemoglobin within desired range.  3. Secondary Hyperparathyroidism: with outpatient labs: PTH 236, phosphorus 6.2, calcium 8.7 on October 11, 2021.   Lab Results  Component Value Date   PTH 54 06/11/2020   CALCIUM 8.9 02/21/2022   CAION 1.14 (L) 01/03/2022   PHOS 5.7 (H) 02/21/2022    We will continue to monitor bone minerals with dietary changes.  4. 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 Home regimen includes glipizide, Lantus and Levemir. Most recent hemoglobin A1c is 7.7 on 02/02/2022.   Sliding scale insulin managed by primary team.    LOS: [place] 11/3/20231:46 PM

## 2022-02-23 NOTE — Progress Notes (Deleted)
[facility] Progress Note  Patient: Bradley Lopez    WPY:[id number]  DOA: 02/19/2022    Date of Service: the patient was seen and examined on 02/23/2022  Brief hospital course: Patient is a 46 year old male with past medical history of diabetes mellitus with renal complications and now stage V chronic kidney disease, secondary anemia, depression and hypertension.  Over the past 1 year, patient's creatinine has worsened from 1.96 to most recently 8.20.  Last month, patient had an AV fistula placed.  He started having weakness, dizziness and nausea for the past 2 weeks as well as lower extremity swelling and periorbital edema, times which were felt to be secondary to uremia from end-stage renal disease and patient was sent over to the emergency room on 10/30 by his nephrologist for initiation of dialysis.  Patient was admitted to the hospitalist service.  Vascular surgery placed temporary femoral dialysis catheter on 10/30 and patient has been getting daily dialysis.  Ultrasound noted possible stenosis of fistula, so on 11/3, patient underwent placement of tunneled catheter and fistulogram which determined no evidence of stenosis in fistula and felt to be patent.  Assessment and Plan: Assessment and Plan: * Generalized weakness Secondary to worsening renal function with some uremia. Expect improvement in patient's symptoms with renal replacement therapy.    ESRD (end stage renal disease) ([place]) Temporary dialysis catheter placed 10/30 by vascular surgery.  Patient's stage V chronic kidney disease has progressed now to end-stage.  Questionable fistula stenosis seen on ultrasound, so patient underwent fistulogram on 11/3 which noted no evidence of stenosis and fistula felt to be patent.  Total PermCath placed as well which will be used for dialysis until fistula is ready.    Nephrology working to find outpatient facility for patient to receive dialysis.  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  Continue diuretics with furosemide and metolazone. Continue atorvastatin.  In addition, volume is also being managed by dialysis  Uremia Secondary to worsening renal function and accounts for patient's symptoms.  Should improve with dialysis.  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  Hold glipizide  Essential hypertension Patient is on multiple antihypertensive medications which include lisinopril, amlodipine, metoprolol and clonidine. Monitor patient closely for bradycardia since he is on metoprolol and clonidine.  At times, patient is spiking blood pressure and attempted to treat with hydralazine.  Anemia due to chronic kidney disease Hemoglobin is 8 and appears to be patient's baseline Follow-up with hematology as an outpatient for iron infusions  Depression Continue mirtazapine       Body mass index is 22.46 kg/m.  Nutrition Problem: Increased nutrient needs Etiology: chronic illness (ESRD on HD)     Consultants: Nephrology Vascular surgery  Procedures: Placement temporary dialysis femoral catheter 10/30 Placement of tunneled dialysis catheter 11/3 Evaluation of fistula by fistulogram-no stenosis noted  Antimicrobials: None  Code Status: Full code   Subjective: Seen before fistulogram.  Fatigued  Objective: Vital signs were reviewed and unremarkable. Vitals:   02/23/22 1500 02/23/22 1530  BP: 138/81 97/66  Pulse: 73 66  Resp: 13 10  Temp:    SpO2: 100% 100%   No intake or output data in the 24 hours ending 02/23/22 1536  Filed Weights   02/21/22 1410 02/22/22 0839 02/22/22 1227  Weight: 83.9 kg 82.2 kg 81.5 kg  Body mass index is 22.46 kg/m.  Exam:  General: Alert and oriented x3, fatigued HEENT: Normocephalic, atraumatic,  mucous membranes are moist Cardiovascular: Regular rate and rhythm, S1-S2 Respiratory: Clear to auscultation bilaterally Abdomen: Soft, nontender, nondistended, positive bowel sounds Musculoskeletal: No cyanosis, 2+ pitting edema Skin: No skin breaks, tears or lesions Psychiatry: Appropriate, no evidence of psychosis  Neurology: No focal deficits  Data Reviewed: No labs today  Disposition:  Status is: Inpatient Remains inpatient appropriate because:  -Set up of outpatient dialysis -Stabilization of blood pressure    Anticipated discharge date: 11/6   Family Communication: Will call family DVT Prophylaxis: heparin injection 5,000 Units Start: 02/21/22 2200 SCDs Start: 02/19/22 1220    Author: Annita Brod ,MD 02/23/2022 3:36 PM  To reach On-call, see care teams to locate the attending and reach out via [email or web address]. Between 7PM-7AM, please contact night-coverage If you still have difficulty reaching the attending provider, please page the [facility] (Director on Call) for [facility] on amion for assistance.

## 2022-02-24 DIAGNOSIS — N186 End stage renal disease: Secondary | ICD-10-CM | POA: Diagnosis not present

## 2022-02-24 DIAGNOSIS — E1121 Type 2 diabetes mellitus with diabetic nephropathy: Secondary | ICD-10-CM | POA: Diagnosis not present

## 2022-02-24 DIAGNOSIS — R531 Weakness: Secondary | ICD-10-CM | POA: Diagnosis not present

## 2022-02-24 LAB — CBC
HCT: 27.1 % — ABNORMAL LOW (ref 39.0–52.0)
Hemoglobin: 9 g/dL — ABNORMAL LOW (ref 13.0–17.0)
MCH: 30 pg (ref 26.0–34.0)
MCHC: 33.2 g/dL (ref 30.0–36.0)
MCV: 90.3 fL (ref 80.0–100.0)
Platelets: 236 10*3/uL (ref 150–400)
RBC: 3 MIL/uL — ABNORMAL LOW (ref 4.22–5.81)
RDW: 13.6 % (ref 11.5–15.5)
WBC: 10.2 10*3/uL (ref 4.0–10.5)
nRBC: 0 % (ref 0.0–0.2)

## 2022-02-24 LAB — BASIC METABOLIC PANEL
Anion gap: 7 (ref 5–15)
BUN: 35 mg/dL — ABNORMAL HIGH (ref 6–20)
CO2: 31 mmol/L (ref 22–32)
Calcium: 9.2 mg/dL (ref 8.9–10.3)
Chloride: 99 mmol/L (ref 98–111)
Creatinine, Ser: 4.95 mg/dL — ABNORMAL HIGH (ref 0.61–1.24)
GFR, Estimated: 14 mL/min — ABNORMAL LOW (ref >60)
Glucose, Bld: 137 mg/dL — ABNORMAL HIGH (ref 70–99)
Potassium: 4.2 mmol/L (ref 3.5–5.1)
Sodium: 137 mmol/L (ref 135–145)

## 2022-02-24 LAB — GLUCOSE, CAPILLARY: Glucose-Capillary: 129 mg/dL — ABNORMAL HIGH (ref 70–99)

## 2022-02-24 NOTE — Progress Notes (Signed)
Pt A/Ox4 upon review of AVS. Patients wife driving him home. No furhter needs.

## 2022-02-24 NOTE — Discharge Summary (Signed)
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 *  Hospital Course: Patient is a 46 year old male with past medical history of diabetes mellitus with renal complications and now stage V chronic kidney disease, secondary anemia, depression and hypertension.  Over the past 1 year, patient's creatinine has worsened from 1.96 to most recently 8.20.  Last month, patient had an AV fistula placed.  He started having weakness, dizziness and nausea for the past 2 weeks as well as lower extremity swelling and periorbital edema, times which were felt to be secondary to uremia from end-stage renal disease and patient was sent over to the emergency room on 10/30 by his nephrologist for initiation of dialysis.  Patient was admitted to the hospitalist service.  Vascular surgery placed temporary femoral dialysis catheter on 10/30 and patient has been getting daily dialysis.  Ultrasound noted possible stenosis of fistula, so on 11/3, patient underwent placement of tunneled catheter and fistulogram which determined no evidence of stenosis in fistula and felt to be patent.  Assessment and Plan: * Generalized weakness Secondary to worsening renal function with some uremia. Expect improvement in patient's symptoms with renal replacement therapy.    ESRD (end stage renal disease) ([place]) Temporary dialysis catheter  placed 10/30 by vascular surgery.  Patient's stage V chronic kidney disease has progressed now to end-stage.  Questionable fistula stenosis seen on ultrasound, so patient underwent fistulogram on 11/3 which noted no evidence of stenosis and fistula felt to be patent.  Total PermCath placed as well which will be used for dialysis until fistula is ready.    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  Continue diuretics with furosemide and metolazone. Continue atorvastatin.  In addition, volume is also being managed by dialysis  Uremia Secondary to worsening renal function and accounts for patient's symptoms.  Should improve with dialysis.  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 Monitor patient closely for bradycardia since he is on metoprolol and clonidine.  At times, patient is spiking blood pressure and attempted to treat with hydralazine.  Anemia due to chronic kidney disease Hemoglobin is 8 and appears to be patient's baseline Follow-up with hematology as an outpatient for iron infusions  Depression Continue mirtazapine        Consultants: Nephrology, vascular Procedures performed:  Perm cath Disposition: Home Diet recommendation:  Discharge Diet Orders (From admission, onward)     Start     Ordered   02/24/22 0000  Diet general       Comments: Renal diet   02/24/22 0851   02/23/22 0000  Diet - low sodium heart healthy  02/23/22 1808   02/19/22 0000  Diet renal with fluid restriction        02/19/22 1845           Renal diet DISCHARGE MEDICATION: Allergies as of 02/24/2022        Reactions   Pine Hives, Itching        Medication List     STOP taking these medications    hydrALAZINE 50 MG tablet Commonly known as: APRESOLINE   HYDROcodone-acetaminophen 5-325 MG tablet Commonly known as: Norco   insulin glargine 100 UNIT/ML injection Commonly known as: LANTUS   lisinopril 20 MG tablet Commonly known as: ZESTRIL   metoprolol tartrate 50 MG tablet Commonly known as: LOPRESSOR   moxifloxacin 0.5 % ophthalmic solution Commonly known as: VIGAMOX   neomycin-polymyxin b-dexamethasone 3.5-10000-0.1 Oint Commonly known as: MAXITROL   potassium chloride 10 MEQ tablet Commonly known as: KLOR-CON   temazepam 15 MG capsule Commonly known as: RESTORIL       TAKE these medications    amLODipine 10 MG tablet Commonly known as: NORVASC Take 1 tablet (10 mg total) by mouth daily.   aspirin EC 81 MG tablet Take 1 tablet (81 mg total) by mouth daily. Swallow whole.   atorvastatin 80 MG tablet Commonly known as: LIPITOR Take 1 tablet (80 mg total) by mouth daily.   calcitRIOL 0.5 MCG capsule Commonly known as: ROCALTROL Take 0.5 mcg by mouth daily.   carvedilol 12.5 MG tablet Commonly known as: COREG Take 1 tablet (12.5 mg total) by mouth 2 (two) times daily.   cloNIDine 0.3 MG tablet Commonly known as: CATAPRES Take 1 tablet (0.3 mg total) by mouth 2 (two) times daily. What changed:  medication strength how much to take when to take this additional instructions   furosemide 40 MG tablet Commonly known as: LASIX Take 1 tablet (40 mg total) by mouth 2 (two) times daily.   glipiZIDE 5 MG 24 hr tablet Commonly known as: GLUCOTROL XL Take 5 mg by mouth daily.   insulin detemir 100 UNIT/ML injection Commonly known as: LEVEMIR Inject 0.1 mLs (10 Units total) into the skin daily after breakfast. What changed: how much to take   losartan 100 MG tablet Commonly known as: COZAAR Take 1 tablet (100 mg total) by mouth daily.   metolazone 5  MG tablet Commonly known as: ZAROXOLYN Take 5 mg by mouth daily.   midodrine 5 MG tablet Commonly known as: PROAMATINE Take 1 tablet (5 mg total) by mouth every Monday, Wednesday, and Friday with hemodialysis. Start taking on: February 26, 2022   mirtazapine 15 MG tablet Commonly known as: REMERON Take 15 mg by mouth at bedtime.   multivitamin Tabs tablet Take 1 tablet by mouth at bedtime.   prednisoLONE acetate 1 % ophthalmic suspension Commonly known as: PRED FORTE SMARTSIG:In Eye(s)   sevelamer carbonate 800 MG tablet Commonly known as: RENVELA Take 800 mg by mouth 2 (two) times daily with a meal.   ursodiol 300 MG capsule Commonly known as: ACTIGALL Take by mouth.               Discharge Care Instructions  (From admission, onward)           Start     Ordered   02/24/22 0000  Discharge wound care:       Comments: Follow with HD   02/24/22 0851            Discharge Exam: Filed Weights   02/22/22 6314  02/22/22 1227 02/23/22 1729  Weight: 82.2 kg 81.5 kg 81.9 kg   General exam: Appears calm and comfortable  Respiratory system: Clear to auscultation. Respiratory effort normal. Cardiovascular system: S1 & S2 heard, RRR. No JVD, murmurs, rubs, gallops or clicks. No pedal edema. Gastrointestinal system: Abdomen is nondistended, soft and nontender. No organomegaly or masses felt. Normal bowel sounds heard. Central nervous system: Alert and oriented. No focal neurological deficits. Extremities: Symmetric 5 x 5 power. Skin: No rashes, lesions or ulcers Psychiatry: Judgement and insight appear normal. Mood & affect appropriate.    Condition at discharge: good  The results of significant diagnostics from this hospitalization (including imaging, microbiology, ancillary and laboratory) are listed below for reference.   Imaging Studies: PERIPHERAL VASCULAR CATHETERIZATION  Result Date: 02/23/2022 See surgical note for result.  [place] Dialysis  Access  Result Date: 02/20/2022 CLINICAL DATA:  46 year old male with a history dialysis circuit for hemodialysis EXAM: ULTRASOUND DIALYSIS ACCESS TECHNIQUE: Directed duplex performed of the dialysis circuit left upper extremity COMPARISON:  None Available. FINDINGS: Inflow artery velocity: 213 centimeter/second. Low resistance waveform without spectral broadening Arterial anastomosis velocity: 300 centimeter/second The venous outflow demonstrates a maximum velocity of 745 centimeter/second in the proximal venous outflow, which is greater than 3 times the velocity of the arterial inflow (ratio approaching 3.5). 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 Nadene Rubins, RPVI [facility] Electronically Signed   By: Corrie Mckusick D.O.   On: 02/20/2022 12:05   PERIPHERAL VASCULAR CATHETERIZATION  Result Date: 02/19/2022 See surgical note for result.  DG Chest 1 View  Result Date: 02/19/2022 CLINICAL DATA:  Shortness of breath, dizziness EXAM: CHEST  1 VIEW COMPARISON:  06/10/2020 FINDINGS: The heart size and mediastinal contours are within normal limits. Both lungs are clear. The visualized skeletal structures are unremarkable. IMPRESSION: No active disease. Electronically Signed   By: Elmer Picker M.D.   On: 02/19/2022 09:28    Microbiology: Results for orders placed or performed during the hospital encounter of 02/19/22  Resp Panel by RT-PCR (Flu A&B, Covid) Anterior Nasal Swab     Status: None   Collection Time: 02/19/22 10:21 AM   Specimen: Anterior Nasal Swab  Result Value Ref Range Status   SARS Coronavirus 2 by RT PCR NEGATIVE NEGATIVE Final    Comment: (NOTE) SARS-CoV-2 target  nucleic acids are NOT DETECTED.  The SARS-CoV-2 RNA is generally detectable in upper respiratory specimens during the acute phase of infection. The lowest concentration of SARS-CoV-2 viral copies this assay can detect is 138 copies/mL. A negative result does not preclude SARS-Cov-2 infection and should not be used as the sole basis for treatment or other patient management decisions. A negative result may occur with  improper specimen collection/handling, submission of specimen other than nasopharyngeal swab, presence of viral mutation(s) within the areas targeted by this assay, and inadequate number of viral copies(<138 copies/mL). A negative result must be combined with clinical observations, patient history, and epidemiological information. The expected result is Negative.  Fact Sheet for Patients:  EntrepreneurPulse.com.au  Fact Sheet for Healthcare Providers:  IncredibleEmployment.be  This test is no t yet approved or cleared by the [place] FDA and  has been authorized for detection and/or diagnosis of SARS-CoV-2 by FDA under an Emergency Use Authorization (EUA). This EUA will remain  in effect (meaning this test can be used) for the duration of the COVID-19 declaration under Section 564(b)(1) of the Act, 21 U.S.C.section 360bbb-3(b)(1), unless the authorization is terminated  or revoked sooner.       Influenza A by PCR NEGATIVE NEGATIVE Final   Influenza B by PCR NEGATIVE NEGATIVE Final    Comment: (NOTE) The Xpert Xpress SARS-CoV-2/FLU/RSV plus assay is intended as an aid in the diagnosis of influenza from Nasopharyngeal swab specimens and should not be used as a sole basis for treatment. Nasal washings and aspirates are unacceptable for Xpert Xpress SARS-CoV-2/FLU/RSV testing.  Fact Sheet for Patients: EntrepreneurPulse.com.au  Fact Sheet for Healthcare  Providers: IncredibleEmployment.be  This test is not yet approved or cleared by the [place] FDA and has been authorized for detection and/or diagnosis of SARS-CoV-2 by FDA under an Emergency Use Authorization (EUA). This EUA will remain in effect (meaning this test can be used) for the duration of the COVID-19 declaration under Section 564(b)(1) of the Act, 21 U.S.C. section 360bbb-3(b)(1), unless the authorization is terminated or revoked.  Performed at [facility], [address] [place], [place] [address]     Labs: CBC: Recent Labs  Lab 02/19/22 0844 02/20/22 1420 02/21/22 0426 02/24/22 0459  WBC 12.5* 11.1* 11.0* 10.2  NEUTROABS  --  7.2 6.4  --   HGB 8.0* 8.3* 9.0* 9.0*  HCT 24.5* 26.0* 27.9* 27.1*  MCV 90.7 90.9 90.6 90.3  PLT 283 282 292 004   Basic Metabolic Panel: Recent Labs  Lab 02/19/22 0844 02/20/22 1420 02/21/22 0426 02/24/22 0459  NA 139 140 142 137  K 4.5 4.3 4.0 4.2  CL 105 108 107 99  CO2 23 23 26 31   GLUCOSE 169* 138* 90 137*  BUN 80* 79* 62* 35*  CREATININE 8.20* 8.08* 6.42* 4.95*  CALCIUM 9.1 8.7* 8.9 9.2  PHOS  --  7.3* 5.7*  --    Liver Function Tests: Recent Labs  Lab 02/20/22 1420 02/21/22 0426  ALBUMIN 3.3* 3.5   CBG: Recent Labs  Lab 02/23/22 0851 02/23/22 1121 02/23/22 1341 02/23/22 1751 02/24/22 0819  GLUCAP 113* 110* 115* 137* 129*    Discharge time spent: less than 30 minutes.  Signed: Sharen Hones, MD [facility] 02/24/2022

## 2022-02-26 ENCOUNTER — Encounter: Payer: Self-pay | Admitting: Vascular Surgery

## 2022-03-01 ENCOUNTER — Other Ambulatory Visit: Payer: Self-pay | Admitting: *Deleted

## 2022-03-01 DIAGNOSIS — D631 Anemia in chronic kidney disease: Secondary | ICD-10-CM

## 2022-03-02 ENCOUNTER — Inpatient Hospital Stay: Payer: BC Managed Care – PPO | Attending: Oncology

## 2022-03-02 ENCOUNTER — Inpatient Hospital Stay: Payer: BC Managed Care – PPO

## 2022-03-23 ENCOUNTER — Inpatient Hospital Stay: Payer: BC Managed Care – PPO

## 2022-03-23 ENCOUNTER — Inpatient Hospital Stay: Payer: BC Managed Care – PPO | Attending: Oncology

## 2022-04-13 ENCOUNTER — Inpatient Hospital Stay: Payer: BC Managed Care – PPO

## 2022-04-17 NOTE — H&P (View-Only) (Signed)
Bradley Lopez                        MRN : [id number]  Bradley Lopez is a 46 y.o. (Oct 26, 1975) male who presents with chief complaint of check access.  History of Present Illness:   The patient returns to the office for followup of their dialysis access.   He is s/p left brachial cephalic arteriovenous fistula placement on 01/03/2022. He is s/p fistulagram 02/23/2022 which showed a widely patent cephalic vein no strictures.  The patient denies hand pain or other symptoms consistent with steal phenomena.  No significant arm swelling.  The patient denies any complaints from the dialysis center or their nephrologist.  The patient denies redness or swelling at the access site. The patient denies fever or chills at home or while on dialysis.  Duplex ultrasound of the AV access shows a patent access.  Flow volume today is 996 cc/min     No outpatient medications have been marked as taking for the 04/26/22 encounter (Appointment) with Delana Meyer, Dolores Lory, MD.    Past Medical History:  Diagnosis Date   Allergy    Anemia    Asthma    Chronic kidney disease    Depression    situational   Diabetes mellitus without complication ([place])    Hypertension    Pneumonia     Past Surgical History:  Procedure Laterality Date   A/V FISTULAGRAM N/A 02/23/2022   Procedure: A/V Fistulagram;  Surgeon: Katha Cabal, MD;  Location: [place];  Service: Cardiovascular;  Laterality: N/A;   AV FISTULA PLACEMENT Left 01/03/2022   Procedure: ARTERIOVENOUS (AV) FISTULA CREATION (BRACHIALCEPHALIC);  Surgeon: Katha Cabal, MD;  Location: [facility];  Service: Vascular;  Laterality: Left;   COLONOSCOPY N/A 09/22/2021   Procedure: COLONOSCOPY;  Surgeon: Annamaria Helling, DO;  Location: [facility];  Service: Gastroenterology;  Laterality: N/A;   DIALYSIS/PERMA CATHETER INSERTION N/A 02/23/2022   Procedure: DIALYSIS/PERMA CATHETER INSERTION;  Surgeon: Katha Cabal, MD;  Location:  [facility] [place] [facility];  Service: Cardiovascular;  Laterality: N/A;   EYE SURGERY Bilateral    detatch retina   TEMPORARY DIALYSIS CATHETER Right 02/19/2022   Procedure: TEMPORARY DIALYSIS CATHETER;  Surgeon: Algernon Huxley, MD;  Location: [place] [facility];  Service: Cardiovascular;  Laterality: Right;    Social History Social History   Tobacco Use   Smoking status: Never   Smokeless tobacco: Never  Vaping Use   Vaping Use: Never used  Substance Use Topics   Alcohol use: Never   Drug use: Never    Family History Family History  Problem Relation Age of Onset   Coronary artery disease Mother    Hypertension Father    Prostate cancer Father     Allergies  Allergen Reactions   Pine Hives and Itching     REVIEW OF SYSTEMS (Negative unless checked)  Constitutional: [] Weight loss  [] Fever  [] Chills Cardiac: [] Chest pain   [] Chest pressure   [] Palpitations   [] Shortness of breath when laying flat   [] Shortness of breath with exertion. Vascular:  [] Pain in legs with walking   [] Pain in legs at rest  [] History of DVT   [] Phlebitis   [] Swelling in legs   [] Varicose veins   [] Non-healing ulcers Pulmonary:   [] Uses home oxygen   [] Productive cough   [] Hemoptysis   [] Wheeze  [] COPD   [] Asthma Neurologic:  [] Dizziness   [] Seizures   [] History of stroke   []   History of TIA  [] Aphasia   [] Vissual changes   [] Weakness or numbness in arm   [] Weakness or numbness in leg Musculoskeletal:   [] Joint swelling   [] Joint pain   [] Low back pain Hematologic:  [] Easy bruising  [] Easy bleeding   [] Hypercoagulable state   [] Anemic Gastrointestinal:  [] Diarrhea   [] Vomiting  [] Gastroesophageal reflux/heartburn   [] Difficulty swallowing. Genitourinary:  [x] Chronic kidney disease   [] Difficult urination  [] Frequent urination   [] Blood in urine Skin:  [] Rashes   [] Ulcers  Psychological:  [] History of anxiety   []  History of major depression.  Physical Examination  There were no vitals filed for this  visit. There is no height or weight on file to calculate BMI. Gen: WD/WN, NAD Head: [name]/AT, No temporalis wasting.  Ear/Nose/Throat: Hearing grossly intact, nares w/o erythema or drainage Eyes: PER, EOMI, sclera nonicteric.  Neck: Supple, no gross masses or lesions.  No JVD.  Pulmonary:  Good air movement, no audible wheezing, no use of accessory muscles.  Cardiac: RRR, precordium non-hyperdynamic. Vascular:   good thrill good bruit Vessel Right Left  Radial Palpable Palpable  Brachial Palpable Palpable  Gastrointestinal: soft, non-distended. No guarding/no peritoneal signs.  Musculoskeletal: M/S 5/5 throughout.  No deformity.  Neurologic: CN 2-12 intact. Pain and light touch intact in extremities.  Symmetrical.  Speech is fluent. Motor exam as listed above. Psychiatric: Judgment intact, Mood & affect appropriate for pt's clinical situation. Dermatologic: No rashes or ulcers noted.  No changes consistent with cellulitis.   CBC Lab Results  Component Value Date   WBC 10.2 02/24/2022   HGB 9.0 (L) 02/24/2022   HCT 27.1 (L) 02/24/2022   MCV 90.3 02/24/2022   PLT 236 02/24/2022    BMET    Component Value Date/Time   NA 137 02/24/2022 0459   K 4.2 02/24/2022 0459   CL 99 02/24/2022 0459   CO2 31 02/24/2022 0459   GLUCOSE 137 (H) 02/24/2022 0459   BUN 35 (H) 02/24/2022 0459   CREATININE 4.95 (H) 02/24/2022 0459   CALCIUM 9.2 02/24/2022 0459   GFRNONAA 14 (L) 02/24/2022 0459   CrCl cannot be calculated (Patient's most recent lab result is older than the maximum 21 days allowed.).  COAG Lab Results  Component Value Date   INR 1.2 01/02/2022   INR 0.9 08/18/2021   INR 1.0 01/05/2021    Radiology No results found.   Assessment/Plan 1. ESRD (end stage renal disease) ([place]) Recommend:  OK to begin cannulation.  Once this is successful the catheter will be removed.  The patient is doing well and currently has adequate dialysis access. The patient's dialysis center is  not reporting any access issues. Flow pattern is stable.  The patient should have a duplex ultrasound of the dialysis access in 6 months. The patient will follow-up with me in the office after each ultrasound   - VAS [place] (AVF, AVG); Future  2. Essential hypertension Continue antihypertensive medications as already ordered, these medications have been reviewed and there are no changes at this time.  3. 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  Hgb A1C to be monitored as already arranged by primary service  4. Mixed hyperlipidemia Continue statin as ordered and reviewed, no changes at this time    Hortencia Pilar, MD  04/17/2022 4:28 PM

## 2022-04-17 NOTE — Progress Notes (Signed)
Bradley Lopez                        MRN : [id number]  Bradley Lopez is a 46 y.o. (Feb 07, 1976) male who presents with chief complaint of check access.  History of Present Illness:   The patient returns to the office for followup of their dialysis access.   He is s/p left brachial cephalic arteriovenous fistula placement on 01/03/2022. He is s/p fistulagram 02/23/2022 which showed a widely patent cephalic vein no strictures.  The patient denies hand pain or other symptoms consistent with steal phenomena.  No significant arm swelling.  The patient denies any complaints from the dialysis center or their nephrologist.  The patient denies redness or swelling at the access site. The patient denies fever or chills at home or while on dialysis.  Duplex ultrasound of the AV access shows a patent access.  Flow volume today is 996 cc/min     No outpatient medications have been marked as taking for the 04/26/22 encounter (Appointment) with Delana Meyer, Dolores Lory, MD.    Past Medical History:  Diagnosis Date   Allergy    Anemia    Asthma    Chronic kidney disease    Depression    situational   Diabetes mellitus without complication ([place])    Hypertension    Pneumonia     Past Surgical History:  Procedure Laterality Date   A/V FISTULAGRAM N/A 02/23/2022   Procedure: A/V Fistulagram;  Surgeon: Katha Cabal, MD;  Location: [place] [facility];  Service: Cardiovascular;  Laterality: N/A;   AV FISTULA PLACEMENT Left 01/03/2022   Procedure: ARTERIOVENOUS (AV) FISTULA CREATION (BRACHIALCEPHALIC);  Surgeon: Katha Cabal, MD;  Location: [facility];  Service: Vascular;  Laterality: Left;   COLONOSCOPY N/A 09/22/2021   Procedure: COLONOSCOPY;  Surgeon: Annamaria Helling, DO;  Location: [place];  Service: Gastroenterology;  Laterality: N/A;   DIALYSIS/PERMA CATHETER INSERTION N/A 02/23/2022   Procedure: DIALYSIS/PERMA CATHETER INSERTION;  Surgeon: Katha Cabal, MD;  Location:  [place] [facility];  Service: Cardiovascular;  Laterality: N/A;   EYE SURGERY Bilateral    detatch retina   TEMPORARY DIALYSIS CATHETER Right 02/19/2022   Procedure: TEMPORARY DIALYSIS CATHETER;  Surgeon: Algernon Huxley, MD;  Location: [place] [facility];  Service: Cardiovascular;  Laterality: Right;    Social History Social History   Tobacco Use   Smoking status: Never   Smokeless tobacco: Never  Vaping Use   Vaping Use: Never used  Substance Use Topics   Alcohol use: Never   Drug use: Never    Family History Family History  Problem Relation Age of Onset   Coronary artery disease Mother    Hypertension Father    Prostate cancer Father     Allergies  Allergen Reactions   Pine Hives and Itching     REVIEW OF SYSTEMS (Negative unless checked)  Constitutional: [] Weight loss  [] Fever  [] Chills Cardiac: [] Chest pain   [] Chest pressure   [] Palpitations   [] Shortness of breath when laying flat   [] Shortness of breath with exertion. Vascular:  [] Pain in legs with walking   [] Pain in legs at rest  [] History of DVT   [] Phlebitis   [] Swelling in legs   [] Varicose veins   [] Non-healing ulcers Pulmonary:   [] Uses home oxygen   [] Productive cough   [] Hemoptysis   [] Wheeze  [] COPD   [] Asthma Neurologic:  [] Dizziness   [] Seizures   [] History of stroke   []   History of TIA  [] Aphasia   [] Vissual changes   [] Weakness or numbness in arm   [] Weakness or numbness in leg Musculoskeletal:   [] Joint swelling   [] Joint pain   [] Low back pain Hematologic:  [] Easy bruising  [] Easy bleeding   [] Hypercoagulable state   [] Anemic Gastrointestinal:  [] Diarrhea   [] Vomiting  [] Gastroesophageal reflux/heartburn   [] Difficulty swallowing. Genitourinary:  [x] Chronic kidney disease   [] Difficult urination  [] Frequent urination   [] Blood in urine Skin:  [] Rashes   [] Ulcers  Psychological:  [] History of anxiety   []  History of major depression.  Physical Examination  There were no vitals filed for this  visit. There is no height or weight on file to calculate BMI. Gen: WD/WN, NAD Head: [place]/AT, No temporalis wasting.  Ear/Nose/Throat: Hearing grossly intact, nares w/o erythema or drainage Eyes: PER, EOMI, sclera nonicteric.  Neck: Supple, no gross masses or lesions.  No JVD.  Pulmonary:  Good air movement, no audible wheezing, no use of accessory muscles.  Cardiac: RRR, precordium non-hyperdynamic. Vascular:   good thrill good bruit Vessel Right Left  Radial Palpable Palpable  Brachial Palpable Palpable  Gastrointestinal: soft, non-distended. No guarding/no peritoneal signs.  Musculoskeletal: M/S 5/5 throughout.  No deformity.  Neurologic: CN 2-12 intact. Pain and light touch intact in extremities.  Symmetrical.  Speech is fluent. Motor exam as listed above. Psychiatric: Judgment intact, Mood & affect appropriate for pt's clinical situation. Dermatologic: No rashes or ulcers noted.  No changes consistent with cellulitis.   CBC Lab Results  Component Value Date   WBC 10.2 02/24/2022   HGB 9.0 (L) 02/24/2022   HCT 27.1 (L) 02/24/2022   MCV 90.3 02/24/2022   PLT 236 02/24/2022    BMET    Component Value Date/Time   NA 137 02/24/2022 0459   K 4.2 02/24/2022 0459   CL 99 02/24/2022 0459   CO2 31 02/24/2022 0459   GLUCOSE 137 (H) 02/24/2022 0459   BUN 35 (H) 02/24/2022 0459   CREATININE 4.95 (H) 02/24/2022 0459   CALCIUM 9.2 02/24/2022 0459   GFRNONAA 14 (L) 02/24/2022 0459   CrCl cannot be calculated (Patient's most recent lab result is older than the maximum 21 days allowed.).  COAG Lab Results  Component Value Date   INR 1.2 01/02/2022   INR 0.9 08/18/2021   INR 1.0 01/05/2021    Radiology No results found.   Assessment/Plan 1. ESRD (end stage renal disease) ([place]) Recommend:  OK to begin cannulation.  Once this is successful the catheter will be removed.  The patient is doing well and currently has adequate dialysis access. The patient's dialysis center is  not reporting any access issues. Flow pattern is stable.  The patient should have a duplex ultrasound of the dialysis access in 6 months. The patient will follow-up with me in the office after each ultrasound   - VAS [place] (AVF, AVG); Future  2. Essential hypertension Continue antihypertensive medications as already ordered, these medications have been reviewed and there are no changes at this time.  3. 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  Hgb A1C to be monitored as already arranged by primary service  4. Mixed hyperlipidemia Continue statin as ordered and reviewed, no changes at this time    Hortencia Pilar, MD  04/17/2022 4:28 PM

## 2022-04-20 ENCOUNTER — Other Ambulatory Visit (INDEPENDENT_AMBULATORY_CARE_PROVIDER_SITE_OTHER): Payer: Self-pay | Admitting: Vascular Surgery

## 2022-04-20 DIAGNOSIS — N186 End stage renal disease: Secondary | ICD-10-CM

## 2022-04-26 ENCOUNTER — Ambulatory Visit (INDEPENDENT_AMBULATORY_CARE_PROVIDER_SITE_OTHER): Payer: BC Managed Care – PPO

## 2022-04-26 ENCOUNTER — Encounter (INDEPENDENT_AMBULATORY_CARE_PROVIDER_SITE_OTHER): Payer: Self-pay | Admitting: Vascular Surgery

## 2022-04-26 ENCOUNTER — Ambulatory Visit (INDEPENDENT_AMBULATORY_CARE_PROVIDER_SITE_OTHER): Payer: BC Managed Care – PPO | Admitting: Vascular Surgery

## 2022-04-26 VITALS — BP 178/83 | HR 78 | Ht 75.0 in | Wt 183.2 lb

## 2022-04-26 DIAGNOSIS — I10 Essential (primary) hypertension: Secondary | ICD-10-CM | POA: Diagnosis not present

## 2022-04-26 DIAGNOSIS — N186 End stage renal disease: Secondary | ICD-10-CM | POA: Diagnosis not present

## 2022-04-26 DIAGNOSIS — E782 Mixed hyperlipidemia: Secondary | ICD-10-CM | POA: Diagnosis not present

## 2022-04-26 DIAGNOSIS — E1122 Type 2 diabetes mellitus with diabetic chronic kidney disease: Secondary | ICD-10-CM | POA: Diagnosis not present

## 2022-04-26 DIAGNOSIS — N185 Chronic kidney disease, stage 5: Secondary | ICD-10-CM

## 2022-05-04 ENCOUNTER — Inpatient Hospital Stay: Payer: BC Managed Care – PPO

## 2022-05-04 ENCOUNTER — Inpatient Hospital Stay: Payer: BC Managed Care – PPO | Attending: Oncology | Admitting: Nurse Practitioner

## 2022-05-15 ENCOUNTER — Telehealth (INDEPENDENT_AMBULATORY_CARE_PROVIDER_SITE_OTHER): Payer: Self-pay

## 2022-05-15 NOTE — Telephone Encounter (Signed)
Spoke with the patient and he is scheduled with Dr. Lucky Cowboy on 05/21/22 with a 1:00 pm [facility]. Pre-procedure instructions were discussed and will be mailed.

## 2022-05-21 ENCOUNTER — Encounter: Payer: Self-pay | Admitting: Vascular Surgery

## 2022-05-21 ENCOUNTER — Encounter
Admission: RE | Disposition: A | Discharge status: Home / Self Care | Payer: Self-pay | Source: Ambulatory Visit | Attending: Vascular Surgery

## 2022-05-21 ENCOUNTER — Ambulatory Visit
Admission: RE | Admit: 2022-05-21 | Discharge: 2022-05-21 | Disposition: A | Discharge status: Home / Self Care | Payer: BC Managed Care – PPO | Source: Ambulatory Visit | Attending: Vascular Surgery | Admitting: Vascular Surgery

## 2022-05-21 DIAGNOSIS — E782 Mixed hyperlipidemia: Secondary | ICD-10-CM | POA: Insufficient documentation

## 2022-05-21 DIAGNOSIS — E1122 Type 2 diabetes mellitus with diabetic chronic kidney disease: Secondary | ICD-10-CM | POA: Insufficient documentation

## 2022-05-21 DIAGNOSIS — I120 Hypertensive chronic kidney disease with stage 5 chronic kidney disease or end stage renal disease: Secondary | ICD-10-CM | POA: Diagnosis not present

## 2022-05-21 DIAGNOSIS — Z4901 Encounter for fitting and adjustment of extracorporeal dialysis catheter: Secondary | ICD-10-CM | POA: Diagnosis not present

## 2022-05-21 DIAGNOSIS — N186 End stage renal disease: Secondary | ICD-10-CM | POA: Diagnosis not present

## 2022-05-21 HISTORY — PX: DIALYSIS/PERMA CATHETER REMOVAL: CATH118289

## 2022-05-21 SURGERY — DIALYSIS/PERMA CATHETER REMOVAL
Anesthesia: LOCAL

## 2022-05-21 MED ORDER — LIDOCAINE-EPINEPHRINE (PF) 1 %-1:200000 IJ SOLN
INTRAMUSCULAR | Status: DC | PRN
  Administered 2022-05-21: 20 mL

## 2022-05-21 SURGICAL SUPPLY — 2 items
FORCEPS HALSTEAD CVD 5IN STRL (INSTRUMENTS) IMPLANT
TRAY LACERAT/PLASTIC (MISCELLANEOUS) IMPLANT

## 2022-05-21 NOTE — Interval H&P Note (Signed)
History and Physical Interval Note:  05/21/2022 1:48 PM  Bradley Lopez  has presented today for surgery, with the diagnosis of Perma Cath Removal   End Stage Renal.  The various methods of treatment have been discussed with the patient and family. After consideration of risks, benefits and other options for treatment, the patient has consented to  Procedure(s): DIALYSIS/PERMA CATHETER REMOVAL (N/A) as a surgical intervention.  The patient's history has been reviewed, patient examined, no change in status, stable for surgery.  I have reviewed the patient's chart and labs.  Questions were answered to the patient's satisfaction.     [name]

## 2022-05-21 NOTE — Discharge Instructions (Signed)
Tunneled Catheter Removal, Care After Refer to this sheet in the next few weeks. These instructions provide you with information about caring for yourself after your procedure. Your health care provider may also give you more specific instructions. Your treatment has been planned according to current medical practices, but problems sometimes occur. Call your health care provider if you have any problems or questions after your procedure. What can I expect after the procedure? After the procedure, it is common to have: Some mild redness, swelling, and pain around your catheter site.   Follow these instructions at home: Incision care  Check your removal site  every day for signs of infection. Check for: More redness, swelling, or pain. More fluid or blood. Warmth. Pus or a bad smell. Remove your dressing in 48hrs leave open to air  Activity  Return to your normal activities as told by your health care provider. Ask your health care provider what activities are safe for you. Do not lift anything that is heavier than 10 lb (4.5 kg) for 3 days  You may shower tomorrow  Contact a health care provider if: You have more fluid or blood coming from your removal site You have more redness, swelling, or pain at your incisions or around the area where your catheter was removed Your removal site feel warm to the touch. You feel unusually weak. You feel nauseous.. Get help right away if You have swelling in your arm, shoulder, neck, or face. You develop chest pain. You have difficulty breathing. You feel dizzy or light-headed. You have pus or a bad smell coming from your removal site You have a fever. You develop bleeding from your removal site, and your bleeding does not stop. This information is not intended to replace advice given to you by your health care provider. Make sure you discuss any questions you have with your health care provider. Document Released: 03/26/2012 Document Revised:  12/11/2015 Document Reviewed: 01/03/2015 Elsevier Interactive Patient Education  2017 [employer] Inc[employer] 

## 2022-05-21 NOTE — Op Note (Signed)
Operative Note     Preoperative diagnosis:   1. ESRD with functional permanent access  Postoperative diagnosis:  1. ESRD with functional permanent access  Procedure:  Removal of right jugular Permcath  Surgeon:  [name], MD  Anesthesia:  Local  EBL:  Minimal  Indication for the Procedure:  The patient has a functional permanent dialysis access and no longer needs their permcath.  This can be removed.  Risks and benefits are discussed and informed consent is obtained.  Description of the Procedure:  The patient's right neck, chest and existing catheter were sterilely prepped and draped. The area around the catheter was anesthetized copiously with 1% lidocaine. The catheter was dissected out with curved hemostats until the cuff was freed from the surrounding fibrous sheath. The fiber sheath was transected, and the catheter was then removed in its entirety using gentle traction. Pressure was held and sterile dressings were placed. The patient tolerated the procedure well and was taken to the recovery room in stable condition.     [name]  05/21/2022, 2:41 PM This note was created with Dragon Medical transcription system. Any errors in dictation are purely unintentional.

## 2022-07-13 ENCOUNTER — Other Ambulatory Visit: Payer: Self-pay

## 2022-07-13 ENCOUNTER — Emergency Department
Admission: EM | Admit: 2022-07-13 | Discharge: 2022-07-13 | Disposition: A | Discharge status: Home / Self Care | Payer: BC Managed Care – PPO | Attending: Emergency Medicine | Admitting: Emergency Medicine

## 2022-07-13 ENCOUNTER — Encounter
Admission: EM | Disposition: A | Discharge status: Home / Self Care | Payer: Self-pay | Source: Home / Self Care | Attending: Emergency Medicine

## 2022-07-13 DIAGNOSIS — T82898A Other specified complication of vascular prosthetic devices, implants and grafts, initial encounter: Secondary | ICD-10-CM

## 2022-07-13 DIAGNOSIS — T82590A Other mechanical complication of surgically created arteriovenous fistula, initial encounter: Secondary | ICD-10-CM | POA: Insufficient documentation

## 2022-07-13 DIAGNOSIS — L905 Scar conditions and fibrosis of skin: Secondary | ICD-10-CM | POA: Diagnosis not present

## 2022-07-13 DIAGNOSIS — Z992 Dependence on renal dialysis: Secondary | ICD-10-CM | POA: Insufficient documentation

## 2022-07-13 DIAGNOSIS — N186 End stage renal disease: Secondary | ICD-10-CM | POA: Insufficient documentation

## 2022-07-13 DIAGNOSIS — E1122 Type 2 diabetes mellitus with diabetic chronic kidney disease: Secondary | ICD-10-CM | POA: Insufficient documentation

## 2022-07-13 DIAGNOSIS — I120 Hypertensive chronic kidney disease with stage 5 chronic kidney disease or end stage renal disease: Secondary | ICD-10-CM | POA: Insufficient documentation

## 2022-07-13 DIAGNOSIS — Y841 Kidney dialysis as the cause of abnormal reaction of the patient, or of later complication, without mention of misadventure at the time of the procedure: Secondary | ICD-10-CM | POA: Insufficient documentation

## 2022-07-13 DIAGNOSIS — J45909 Unspecified asthma, uncomplicated: Secondary | ICD-10-CM | POA: Diagnosis not present

## 2022-07-13 DIAGNOSIS — Z789 Other specified health status: Secondary | ICD-10-CM

## 2022-07-13 HISTORY — PX: DIALYSIS/PERMA CATHETER INSERTION: CATH118288

## 2022-07-13 LAB — BASIC METABOLIC PANEL
Anion gap: 11 (ref 5–15)
BUN: 62 mg/dL — ABNORMAL HIGH (ref 6–20)
CO2: 25 mmol/L (ref 22–32)
Calcium: 8.8 mg/dL — ABNORMAL LOW (ref 8.9–10.3)
Chloride: 102 mmol/L (ref 98–111)
Creatinine, Ser: 6.8 mg/dL — ABNORMAL HIGH (ref 0.61–1.24)
GFR, Estimated: 9 mL/min — ABNORMAL LOW (ref >60)
Glucose, Bld: 109 mg/dL — ABNORMAL HIGH (ref 70–99)
Potassium: 4.2 mmol/L (ref 3.5–5.1)
Sodium: 138 mmol/L (ref 135–145)

## 2022-07-13 LAB — GLUCOSE, CAPILLARY: Glucose-Capillary: 82 mg/dL (ref 70–99)

## 2022-07-13 SURGERY — DIALYSIS/PERMA CATHETER INSERTION
Anesthesia: Moderate Sedation

## 2022-07-13 MED ORDER — CEFAZOLIN SODIUM-DEXTROSE 1-4 GM/50ML-% IV SOLN: 1.0000 g | INTRAVENOUS | Status: AC

## 2022-07-13 MED ORDER — LABETALOL HCL 5 MG/ML IV SOLN
INTRAVENOUS | Status: AC
  Filled 2022-07-13: qty 4

## 2022-07-13 MED ORDER — CLONIDINE HCL 0.1 MG PO TABS: 0.3000 mg | ORAL_TABLET | Freq: Once | ORAL | Status: AC

## 2022-07-13 MED ORDER — ONDANSETRON HCL 4 MG/2ML IJ SOLN: 4.0000 mg | Freq: Four times a day (QID) | INTRAMUSCULAR | Status: DC | PRN

## 2022-07-13 MED ORDER — MIDAZOLAM HCL 2 MG/2ML IJ SOLN
INTRAMUSCULAR | Status: AC
  Filled 2022-07-13: qty 2

## 2022-07-13 MED ORDER — MIDAZOLAM HCL 2 MG/ML PO SYRP: 8.0000 mg | ORAL_SOLUTION | Freq: Once | ORAL | Status: DC | PRN

## 2022-07-13 MED ORDER — MIDAZOLAM HCL 2 MG/2ML IJ SOLN
INTRAMUSCULAR | Status: DC | PRN
  Administered 2022-07-13: 2 mg via INTRAVENOUS

## 2022-07-13 MED ORDER — SODIUM CHLORIDE 0.9 % IV SOLN: INTRAVENOUS | Status: DC

## 2022-07-13 MED ORDER — LABETALOL HCL 5 MG/ML IV SOLN
INTRAVENOUS | Status: DC | PRN
  Administered 2022-07-13 (×2): 10 mg via INTRAVENOUS

## 2022-07-13 MED ORDER — HEPARIN SODIUM (PORCINE) 10000 UNIT/ML IJ SOLN
INTRAMUSCULAR | Status: AC
  Filled 2022-07-13: qty 1

## 2022-07-13 MED ORDER — FAMOTIDINE 20 MG PO TABS: 40.0000 mg | ORAL_TABLET | Freq: Once | ORAL | Status: DC | PRN

## 2022-07-13 MED ORDER — METHYLPREDNISOLONE SODIUM SUCC 125 MG IJ SOLR: 125.0000 mg | Freq: Once | INTRAMUSCULAR | Status: DC | PRN

## 2022-07-13 MED ORDER — DIPHENHYDRAMINE HCL 50 MG/ML IJ SOLN: 50.0000 mg | Freq: Once | INTRAMUSCULAR | Status: DC | PRN

## 2022-07-13 MED ORDER — CEFAZOLIN SODIUM-DEXTROSE 1-4 GM/50ML-% IV SOLN
INTRAVENOUS | Status: AC
  Administered 2022-07-13: 1 g via INTRAVENOUS
  Filled 2022-07-13: qty 50

## 2022-07-13 MED ORDER — CLONIDINE HCL 0.1 MG PO TABS
ORAL_TABLET | ORAL | Status: AC
  Administered 2022-07-13: 0.3 mg via ORAL
  Filled 2022-07-13: qty 3

## 2022-07-13 MED ORDER — HYDROMORPHONE HCL 1 MG/ML IJ SOLN: 1.0000 mg | Freq: Once | INTRAMUSCULAR | Status: DC | PRN

## 2022-07-13 SURGICAL SUPPLY — 7 items
ADH SKN CLS APL DERMABOND .7 (GAUZE/BANDAGES/DRESSINGS) ×1
CATH PALINDROME-P 19CM W/VT (CATHETERS) IMPLANT
DERMABOND ADVANCED .7 DNX12 (GAUZE/BANDAGES/DRESSINGS) IMPLANT
GUIDEWIRE SUPER STIFF .035X180 (WIRE) IMPLANT
PACK ANGIOGRAPHY (CUSTOM PROCEDURE TRAY) IMPLANT
SUT MNCRL AB 4-0 PS2 18 (SUTURE) IMPLANT
SUT PROLENE 0 CT 1 30 (SUTURE) IMPLANT

## 2022-07-13 NOTE — Op Note (Signed)
OPERATIVE NOTE    PRE-OPERATIVE DIAGNOSIS: 1. ESRD 2.  Poorly functioning AV fistula  POST-OPERATIVE DIAGNOSIS: same as above  PROCEDURE: Ultrasound guidance for vascular access to the right internal jugular vein Fluoroscopic guidance for placement of catheter Placement of a right cm tip to cuff tunneled hemodialysis catheter via the right internal jugular vein  SURGEON: [name], MD  ANESTHESIA:  Local with Moderate conscious sedation for approximately 26 minutes using 2 mg of Versed   ESTIMATED BLOOD LOSS: 15 cc  FLUORO TIME: less than one minute  CONTRAST: none  FINDING(S): 1.  Patent right internal jugular vein  SPECIMEN(S):  None  INDICATIONS:   Bradley Lopez is a 47 y.o.male who presents with renal failure and a fistula that is not able to be currently used for dialysis.  The patient needs long term dialysis access for their ESRD, and a Permcath is necessary.  Risks and benefits are discussed and informed consent is obtained.    DESCRIPTION: After obtaining full informed written consent, the patient was brought back to the vascular suited. The patient's right neck and chest were sterilely prepped and draped in a sterile surgical field was created. Moderate conscious sedation was administered during a face to face encounter with the patient throughout the procedure with my supervision of the RN administering medicines and monitoring the patient's vital signs, pulse oximetry, telemetry and mental status throughout from the start of the procedure until the patient was taken to the recovery room.  The right internal jugular vein was visualized with ultrasound and found to be patent. It was then accessed under direct ultrasound guidance and a permanent image was recorded. A wire was placed. After skin nick and dilatation, the peel-away sheath was placed over the wire.  The extensive scar tissue in the area, we actually had to exchange for an Amplatz Super Stiff wire to get the  dilator and peel-away sheath in. I then turned my attention to an area under the clavicle. Approximately 1-2 fingerbreadths below the clavicle a small counterincision was created and tunneled from the subclavicular incision to the access site. Using fluoroscopic guidance, a 19 centimeter tip to cuff tunneled hemodialysis catheter was selected, and tunneled from the subclavicular incision to the access site. It was then placed through the peel-away sheath and the peel-away sheath was removed. Using fluoroscopic guidance the catheter tips were parked in the right atrium. The appropriate distal connectors were placed. It withdrew blood well and flushed easily with heparinized saline and a concentrated heparin solution was then placed. It was secured to the chest wall with 2 Prolene sutures. The access incision was closed single 4-0 Monocryl. A 4-0 Monocryl pursestring suture was placed around the exit site. Sterile dressings were placed. The patient tolerated the procedure well and was taken to the recovery room in stable condition.  COMPLICATIONS: None  CONDITION: Stable  [name], MD 07/13/2022 5:38 PM   This note was created with Dragon Medical transcription system. Any errors in dictation are purely unintentional.

## 2022-07-13 NOTE — Consult Note (Signed)
[facility] Consult Note  MRN : [id number]  Bradley Lopez is a 47 y.o. ([birth year]) male who presents with chief complaint of  Chief Complaint  Patient presents with   Vascular Access Problem  .   Consulting Physician:Stafford, Doren Custard, MD Reason for consult: Nonfunctional dialysis access History of Present Illness: Bradley Lopez is a 47 year old male with a previous medical history of end-stage renal disease, diabetes, hypertension and asthma.  The patient presents to [facility] emergency department due to having issues with his dialysis vascular access.  He notes that he was last able to dialyze on Monday without issue.  He notes that on Wednesday he had difficulties where 1 needle was not able to function properly.  He notes that he when he went today he continued to have the same issues.  He denies any pain or swelling in the area.  He denies any extensive bleeding on the dialysis access is working.  He has a strong thrill today and it is slightly pulsatile.  He recently had his PermCath removed on 05/21/2022.  He currently is dialyzed via a left upper extremity brachiocephalic AV fistula.  Current Facility-Administered Medications  Medication Dose Route Frequency Provider Last Rate Last Admin   ceFAZolin (ANCEF) 1-4 GM/50ML-% IVPB             Past Medical History:  Diagnosis Date   Allergy    Anemia    Asthma    Chronic kidney disease    Depression    situational   Diabetes mellitus without complication ([place])    Hypertension    Pneumonia     Past Surgical History:  Procedure Laterality Date   A/V FISTULAGRAM N/A 02/23/2022   Procedure: A/V Fistulagram;  Surgeon: Katha Cabal, MD;  Location: [place] [facility];  Service: Cardiovascular;  Laterality: N/A;   AV FISTULA PLACEMENT Left 01/03/2022   Procedure: ARTERIOVENOUS (AV) FISTULA CREATION (BRACHIALCEPHALIC);  Surgeon: Katha Cabal, MD;  Location: [facility];   Service: Vascular;  Laterality: Left;   COLONOSCOPY N/A 09/22/2021   Procedure: COLONOSCOPY;  Surgeon: Annamaria Helling, DO;  Location: [place];  Service: Gastroenterology;  Laterality: N/A;   DIALYSIS/PERMA CATHETER INSERTION N/A 02/23/2022   Procedure: DIALYSIS/PERMA CATHETER INSERTION;  Surgeon: Katha Cabal, MD;  Location: [place] [facility];  Service: Cardiovascular;  Laterality: N/A;   DIALYSIS/PERMA CATHETER REMOVAL N/A 05/21/2022   Procedure: DIALYSIS/PERMA CATHETER REMOVAL;  Surgeon: Algernon Huxley, MD;  Location: [place] [facility];  Service: Cardiovascular;  Laterality: N/A;   EYE SURGERY Bilateral    detatch retina   TEMPORARY DIALYSIS CATHETER Right 02/19/2022   Procedure: TEMPORARY DIALYSIS CATHETER;  Surgeon: Algernon Huxley, MD;  Location: [place] [facility];  Service: Cardiovascular;  Laterality: Right;    Social History Social History   Tobacco Use   Smoking status: Never    Passive exposure: Never   Smokeless tobacco: Never  Vaping Use   Vaping Use: Never used  Substance Use Topics   Alcohol use: Never   Drug use: Never    Family History Family History  Problem Relation Age of Onset   Coronary artery disease Mother    Hypertension Father    Prostate cancer Father     Allergies  Allergen Reactions   Pine Hives and Itching     REVIEW OF SYSTEMS (Negative unless checked)  Constitutional: [] Weight loss  [] Fever  [] Chills Cardiac: [] Chest pain   [] Chest pressure   []   Palpitations   [] Shortness of breath when laying flat   [] Shortness of breath at rest   [] Shortness of breath with exertion. Vascular:  [] Pain in legs with walking   [] Pain in legs at rest   [] Pain in legs when laying flat   [] Claudication   [] Pain in feet when walking  [] Pain in feet at rest  [] Pain in feet when laying flat   [] History of DVT   [] Phlebitis   [] Swelling in legs   [] Varicose veins   [] Non-healing ulcers Pulmonary:   [] Uses home oxygen   [] Productive cough    [] Hemoptysis   [] Wheeze  [] COPD   [] Asthma Neurologic:  [] Dizziness  [] Blackouts   [] Seizures   [] History of stroke   [] History of TIA  [] Aphasia   [] Temporary blindness   [] Dysphagia   [] Weakness or numbness in arms   [] Weakness or numbness in legs Musculoskeletal:  [] Arthritis   [] Joint swelling   [] Joint pain   [] Low back pain Hematologic:  [] Easy bruising  [] Easy bleeding   [] Hypercoagulable state   [] Anemic  [] Hepatitis Gastrointestinal:  [] Blood in stool   [] Vomiting blood  [] Gastroesophageal reflux/heartburn   [] Difficulty swallowing. Genitourinary:  [x] Chronic kidney disease   [] Difficult urination  [] Frequent urination  [] Burning with urination   [] Blood in urine Skin:  [] Rashes   [] Ulcers   [] Wounds Psychological:  [] History of anxiety   []  History of major depression.  Physical Examination  Vitals:   07/13/22 1232 07/13/22 1451  BP: (!) 202/91 (!) 209/94  Pulse: (!) 57 60  Resp: 17 17  Temp: 98 F (36.7 C) 98.1 F (36.7 C)  TempSrc: Oral Oral  SpO2: 100% 100%   There is no height or weight on file to calculate BMI. Gen:  WD/WN, NAD Head: [place]/AT, No temporalis wasting. Prominent temp pulse not noted. Ear/Nose/Throat: Hearing grossly intact, nares w/o erythema or drainage, oropharynx w/o Erythema/Exudate Eyes: Sclera non-icteric, conjunctiva clear Neck: Trachea midline.  No JVD.  Pulmonary:  Good air movement, respirations not labored, equal bilaterally.  Cardiac: RRR, normal S1, S2. Vascular: Strong thrill in left brachial cephalic AV fistula Vessel Right Left  Radial Palpable Palpable   Gastrointestinal: soft, non-tender/non-distended. No guarding/reflex.  Musculoskeletal: M/S 5/5 throughout.  Extremities without ischemic changes.  No deformity or atrophy. No edema. Neurologic: Sensation grossly intact in extremities.  Symmetrical.  Speech is fluent. Motor exam as listed above. Psychiatric: Judgment intact, Mood & affect appropriate for pt's clinical  situation. Dermatologic: No rashes or ulcers noted.  No cellulitis or open wounds. Lymph : No Cervical, Axillary, or Inguinal lymphadenopathy.    CBC Lab Results  Component Value Date   WBC 10.2 02/24/2022   HGB 9.0 (L) 02/24/2022   HCT 27.1 (L) 02/24/2022   MCV 90.3 02/24/2022   PLT 236 02/24/2022    BMET    Component Value Date/Time   NA 138 07/13/2022 1527   K 4.2 07/13/2022 1527   CL 102 07/13/2022 1527   CO2 25 07/13/2022 1527   GLUCOSE 109 (H) 07/13/2022 1527   BUN 62 (H) 07/13/2022 1527   CREATININE 6.80 (H) 07/13/2022 1527   CALCIUM 8.8 (L) 07/13/2022 1527   GFRNONAA 9 (L) 07/13/2022 1527   CrCl cannot be calculated (Unknown ideal weight.).  COAG Lab Results  Component Value Date   INR 1.2 01/02/2022   INR 0.9 08/18/2021   INR 1.0 01/05/2021    Radiology No results found.    Assessment/Plan 1. End stage renal disease  Today the patient  has a strong thrill on evaluation.  I suspect that the patient has not completely occluded.  However due to the ongoing difficulties we will plan on a PermCath placement today, due to the patient having eaten earlier.  We will allow the patient to be discharged today because he has dialysis tomorrow as long as his potassium is within a safe level.  We will contact the patient in order to have him scheduled for a fistulogram on an outpatient basis next week.  I discussed this with the patient and he is in agreement with the plan.  2.  Diabetes  Continue hypoglycemic medications as already ordered, these medications have been reviewed and there are no changes at this time.  Hgb A1C to be monitored as already arranged by primary service  3.  Hypertension  Continue antihypertensive medications as already ordered, these medications have been reviewed and there are no changes at this time.  Plan of care discussed with Dr. Lucky Cowboy and he is in agreement with plan noted above.   Family Communication: None at bedside  Total  Time:55 mins  I spent 55 minutes in this encounter including personally reviewing extensive medical records, personally reviewing imaging studies and compared to prior scans, counseling the patient, placing orders, coordinating care and performing appropriate documentation  Thank you for allowing [place] to participate in the care of this patient.   Kris Hartmann, NP [facility] [phone] (Office Phone) [phone] (Office Fax) [phone] (Pager)  07/13/2022 4:13 PM  Staff may message me via secure chat in [place]  but this may not receive immediate response,  please page for urgent matters!  Dictation software was used to generate the above note. Typos may occur and escape review, as with typed/written notes. Any error is purely unintentional.  Please contact me directly for clarity if needed.

## 2022-07-13 NOTE — H&P (View-Only) (Signed)
[facility] Vascular Consult Note  MRN : [id number]  Bradley Lopez is a 47 y.o. (10/04/1975) male who presents with chief complaint of  Chief Complaint  Patient presents with   Vascular Access Problem  .   Consulting Physician:Stafford, Phillip, MD Reason for consult: Nonfunctional dialysis access History of Present Illness: Bradley Lopez is a 47-year-old male with a previous medical history of end-stage renal disease, diabetes, hypertension and asthma.  The patient presents to  [facility] emergency department due to having issues with his dialysis vascular access.  He notes that he was last able to dialyze on Monday without issue.  He notes that on Wednesday he had difficulties where 1 needle was not able to function properly.  He notes that he when he went today he continued to have the same issues.  He denies any pain or swelling in the area.  He denies any extensive bleeding on the dialysis access is working.  He has a strong thrill today and it is slightly pulsatile.  He recently had his PermCath removed on 05/21/2022.  He currently is dialyzed via a left upper extremity brachiocephalic AV fistula.  Current Facility-Administered Medications  Medication Dose Route Frequency Provider Last Rate Last Admin   ceFAZolin (ANCEF) 1-4 GM/50ML-% IVPB             Past Medical History:  Diagnosis Date   Allergy    Anemia    Asthma    Chronic kidney disease    Depression    situational   Diabetes mellitus without complication (HCC)    Hypertension    Pneumonia     Past Surgical History:  Procedure Laterality Date   A/V FISTULAGRAM N/A 02/23/2022   Procedure: A/V Fistulagram;  Surgeon: Schnier, Gregory G, MD;  Location: [facility] INVASIVE CV LAB;  Service: Cardiovascular;  Laterality: N/A;   AV FISTULA PLACEMENT Left 01/03/2022   Procedure: ARTERIOVENOUS (AV) FISTULA CREATION (BRACHIALCEPHALIC);  Surgeon: Schnier, Gregory G, MD;  Location: [facility] ORS;   Service: Vascular;  Laterality: Left;   COLONOSCOPY N/A 09/22/2021   Procedure: COLONOSCOPY;  Surgeon: Russo, Steven Michael, DO;  Location: [facility] ENDOSCOPY;  Service: Gastroenterology;  Laterality: N/A;   DIALYSIS/PERMA CATHETER INSERTION N/A 02/23/2022   Procedure: DIALYSIS/PERMA CATHETER INSERTION;  Surgeon: Schnier, Gregory G, MD;  Location: [facility] INVASIVE CV LAB;  Service: Cardiovascular;  Laterality: N/A;   DIALYSIS/PERMA CATHETER REMOVAL N/A 05/21/2022   Procedure: DIALYSIS/PERMA CATHETER REMOVAL;  Surgeon: Dew, Jason S, MD;  Location: [facility] INVASIVE CV LAB;  Service: Cardiovascular;  Laterality: N/A;   EYE SURGERY Bilateral    detatch retina   TEMPORARY DIALYSIS CATHETER Right 02/19/2022   Procedure: TEMPORARY DIALYSIS CATHETER;  Surgeon: Dew, Jason S, MD;  Location: [facility] INVASIVE CV LAB;  Service: Cardiovascular;  Laterality: Right;    Social History Social History   Tobacco Use   Smoking status: Never    Passive exposure: Never   Smokeless tobacco: Never  Vaping Use   Vaping Use: Never used  Substance Use Topics   Alcohol use: Never   Drug use: Never    Family History Family History  Problem Relation Age of Onset   Coronary artery disease Mother    Hypertension Father    Prostate cancer Father     Allergies  Allergen Reactions   Pine Hives and Itching     REVIEW OF SYSTEMS (Negative unless checked)  Constitutional: []Weight loss  []Fever  []Chills Cardiac: []Chest pain   []Chest pressure   []  Palpitations   []Shortness of breath when laying flat   []Shortness of breath at rest   []Shortness of breath with exertion. Vascular:  []Pain in legs with walking   []Pain in legs at rest   []Pain in legs when laying flat   []Claudication   []Pain in feet when walking  []Pain in feet at rest  []Pain in feet when laying flat   []History of DVT   []Phlebitis   []Swelling in legs   []Varicose veins   []Non-healing ulcers Pulmonary:   []Uses home oxygen   []Productive cough    []Hemoptysis   []Wheeze  []COPD   []Asthma Neurologic:  []Dizziness  []Blackouts   []Seizures   []History of stroke   []History of TIA  []Aphasia   []Temporary blindness   []Dysphagia   []Weakness or numbness in arms   []Weakness or numbness in legs Musculoskeletal:  []Arthritis   []Joint swelling   []Joint pain   []Low back pain Hematologic:  []Easy bruising  []Easy bleeding   []Hypercoagulable state   []Anemic  []Hepatitis Gastrointestinal:  []Blood in stool   []Vomiting blood  []Gastroesophageal reflux/heartburn   []Difficulty swallowing. Genitourinary:  [x]Chronic kidney disease   []Difficult urination  []Frequent urination  []Burning with urination   []Blood in urine Skin:  []Rashes   []Ulcers   []Wounds Psychological:  []History of anxiety   [] History of major depression.  Physical Examination  Vitals:   07/13/22 1232 07/13/22 1451  BP: (!) 202/91 (!) 209/94  Pulse: (!) 57 60  Resp: 17 17  Temp: 98 F (36.7 C) 98.1 F (36.7 C)  TempSrc: Oral Oral  SpO2: 100% 100%   There is no height or weight on file to calculate BMI. Gen:  WD/WN, NAD Head: [place]/AT, No temporalis wasting. Prominent temp pulse not noted. Ear/Nose/Throat: Hearing grossly intact, nares w/o erythema or drainage, oropharynx w/o Erythema/Exudate Eyes: Sclera non-icteric, conjunctiva clear Neck: Trachea midline.  No JVD.  Pulmonary:  Good air movement, respirations not labored, equal bilaterally.  Cardiac: RRR, normal S1, S2. Vascular: Strong thrill in left brachial cephalic AV fistula Vessel Right Left  Radial Palpable Palpable   Gastrointestinal: soft, non-tender/non-distended. No guarding/reflex.  Musculoskeletal: M/S 5/5 throughout.  Extremities without ischemic changes.  No deformity or atrophy. No edema. Neurologic: Sensation grossly intact in extremities.  Symmetrical.  Speech is fluent. Motor exam as listed above. Psychiatric: Judgment intact, Mood & affect appropriate for pt's clinical  situation. Dermatologic: No rashes or ulcers noted.  No cellulitis or open wounds. Lymph : No Cervical, Axillary, or Inguinal lymphadenopathy.    CBC Lab Results  Component Value Date   WBC 10.2 02/24/2022   HGB 9.0 (L) 02/24/2022   HCT 27.1 (L) 02/24/2022   MCV 90.3 02/24/2022   PLT 236 02/24/2022    BMET    Component Value Date/Time   NA 138 07/13/2022 1527   K 4.2 07/13/2022 1527   CL 102 07/13/2022 1527   CO2 25 07/13/2022 1527   GLUCOSE 109 (H) 07/13/2022 1527   BUN 62 (H) 07/13/2022 1527   CREATININE 6.80 (H) 07/13/2022 1527   CALCIUM 8.8 (L) 07/13/2022 1527   GFRNONAA 9 (L) 07/13/2022 1527   CrCl cannot be calculated (Unknown ideal weight.).  COAG Lab Results  Component Value Date   INR 1.2 01/02/2022   INR 0.9 08/18/2021   INR 1.0 01/05/2021    Radiology No results found.    Assessment/Plan 1. End stage renal disease  Today the patient   has a strong thrill on evaluation.  I suspect that the patient has not completely occluded.  However due to the ongoing difficulties we will plan on a PermCath placement today, due to the patient having eaten earlier.  We will allow the patient to be discharged today because he has dialysis tomorrow as long as his potassium is within a safe level.  We will contact the patient in order to have him scheduled for a fistulogram on an outpatient basis next week.  I discussed this with the patient and he is in agreement with the plan.  2.  Diabetes  Continue hypoglycemic medications as already ordered, these medications have been reviewed and there are no changes at this time.  Hgb A1C to be monitored as already arranged by primary service  3.  Hypertension  Continue antihypertensive medications as already ordered, these medications have been reviewed and there are no changes at this time.  Plan of care discussed with Dr. Dew and he is in agreement with plan noted above.   Family Communication: None at bedside  Total  Time:55 mins  I spent 55 minutes in this encounter including personally reviewing extensive medical records, personally reviewing imaging studies and compared to prior scans, counseling the patient, placing orders, coordinating care and performing appropriate documentation  Thank you for allowing us to participate in the care of this patient.   Essense Bousquet E Leray Garverick, NP [facility] [phone] (Office Phone) [phone] (Office Fax) [phone] (Pager)  07/13/2022 4:13 PM  Staff may message me via secure chat in Epic  but this may not receive immediate response,  please page for urgent matters!  Dictation software was used to generate the above note. Typos may occur and escape review, as with typed/written notes. Any error is purely unintentional.  Please contact me directly for clarity if needed.   

## 2022-07-13 NOTE — ED Triage Notes (Signed)
Pt presents to ED with c//o of dialysis vascular assess issue. Pt states he went to dialysis on Monday and had no issues and states Wednesday they were not able to access fistula due a clotting issue. Pt states he was sent here from dialysis today "get this fixed". Pt is A&Ox4.

## 2022-07-13 NOTE — Interval H&P Note (Signed)
History and Physical Interval Note:  07/13/2022 4:45 PM  Bradley Lopez  has presented today for surgery, with the diagnosis of ESRD.  The various methods of treatment have been discussed with the patient and family. After consideration of risks, benefits and other options for treatment, the patient has consented to  Procedure(s): DIALYSIS/PERMA CATHETER INSERTION (N/A) as a surgical intervention.  The patient's history has been reviewed, patient examined, no change in status, stable for surgery.  I have reviewed the patient's chart and labs.  Questions were answered to the patient's satisfaction.     [name]

## 2022-07-13 NOTE — ED Provider Notes (Signed)
[facility] Emergency Department Provider Note     Event Date/Time   First MD Initiated Contact with Patient 07/13/22 1257     (approximate)   History   Vascular Access Problem   HPI  Bradley Lopez is a 47 y.o. male patient with a history of ESRD on dialysis (M, W, F), presents to the ED from dialysis with reports of fistula malfunction.  Patient reports that the dialysis staff were unable to access the fistula due to clotting issues.  She denies any other injury or complaints at this time.  Patient last dialyzed on Monday without difficulty.    Physical Exam   Triage Vital Signs: ED Triage Vitals [07/13/22 1232]  Enc Vitals Group     BP (!) 202/91     Pulse Rate (!) 57     Resp 17     Temp 98 F (36.7 C)     Temp Source Oral     SpO2 100 %     Weight      Height      Head Circumference      Peak Flow      Pain Score 0     Pain Loc      Pain Edu?      Excl. in [place]?     Most recent vital signs: Vitals:   07/13/22 1451 07/13/22 1629  BP: (!) 209/94 (!) 247/103  Pulse: 60 71  Resp: 17 18  Temp: 98.1 F (36.7 C) 97.6 F (36.4 C)  SpO2: 100% 96%    General Awake, no distress. NAD HEENT NCAT. PERRL. EOMI. No rhinorrhea. Mucous membranes are moist.  CV:  Good peripheral perfusion. LUE with a palpable thrill over his brachiocephalic AV fistula. No active bleeding noted.  RESP:  Normal effort.  ABD:  No distention.   ED Results / Procedures / Treatments   Labs (all labs ordered are listed, but only abnormal results are displayed) Labs Reviewed  BASIC METABOLIC PANEL - Abnormal; Notable for the following components:      Result Value   Glucose, Bld 109 (*)    BUN 62 (*)    Creatinine, Ser 6.80 (*)    Calcium 8.8 (*)    GFR, Estimated 9 (*)    All other components within normal limits  GLUCOSE, CAPILLARY  CBC WITH DIFFERENTIAL/PLATELET  CBC WITH DIFFERENTIAL/PLATELET     EKG   RADIOLOGY  No results  found.   PROCEDURES:  Critical Care performed: No  Procedures   MEDICATIONS ORDERED IN ED: Medications  ceFAZolin (ANCEF) 1-4 GM/50ML-% IVPB (has no administration in time range)    IMPRESSION / MDM / ASSESSMENT AND PLAN / ED COURSE  I reviewed the triage vital signs and the nursing notes.                              Differential diagnosis includes, but is not limited to, AV fistula clot, AV fistula malfunction  Patient's presentation is most consistent with acute complicated illness / injury requiring diagnostic workup.  Patient's diagnosis is consistent with dysfunction of AV fistula.  Patient will be evaluated by the vascular service NP, and will have very permacath placed today for temporary dialysis.  Patient scheduled for dialysis tomorrow.  His potassium returned within normal limits today.  Patient is agreeable to the plan and will be scheduled for an outpatient AV fistula revision. Patient is to follow  up with his dialysis center tomorrow as scheduled, and vascular surgery next week as planned. Patient is given ED precautions to return to the ED for any worsening or new symptoms.  FINAL CLINICAL IMPRESSION(S) / ED DIAGNOSES   Final diagnoses:  Problem with vascular access     Rx / DC Orders   ED Discharge Orders     None        Note:  This document was prepared using Dragon voice recognition software and may include unintentional dictation errors.    Melvenia Needles, PA-C 07/13/22 1639    Carrie Mew, MD 07/13/22 2000

## 2022-07-13 NOTE — H&P (View-Only) (Signed)
[facility] [facility] Vascular Consult Note  MRN : [id number]  Bradley Lopez is a 47 y.o. ([birth year]) male who presents with chief complaint of  Chief Complaint  Patient presents with   Vascular Access Problem  .   Consulting Physician:Stafford, Doren Custard, MD Reason for consult: Nonfunctional dialysis access History of Present Illness: Bradley Lopez is a 47 year old male with a previous medical history of end-stage renal disease, diabetes, hypertension and asthma.  The patient presents to [facility] emergency department due to having issues with his dialysis vascular access.  He notes that he was last able to dialyze on Monday without issue.  He notes that on Wednesday he had difficulties where 1 needle was not able to function properly.  He notes that he when he went today he continued to have the same issues.  He denies any pain or swelling in the area.  He denies any extensive bleeding on the dialysis access is working.  He has a strong thrill today and it is slightly pulsatile.  He recently had his PermCath removed on 05/21/2022.  He currently is dialyzed via a left upper extremity brachiocephalic AV fistula.  Current Facility-Administered Medications  Medication Dose Route Frequency Provider Last Rate Last Admin   ceFAZolin (ANCEF) 1-4 GM/50ML-% IVPB             Past Medical History:  Diagnosis Date   Allergy    Anemia    Asthma    Chronic kidney disease    Depression    situational   Diabetes mellitus without complication ([place])    Hypertension    Pneumonia     Past Surgical History:  Procedure Laterality Date   A/V FISTULAGRAM N/A 02/23/2022   Procedure: A/V Fistulagram;  Surgeon: Katha Cabal, MD;  Location: [place] [facility];  Service: Cardiovascular;  Laterality: N/A;   AV FISTULA PLACEMENT Left 01/03/2022   Procedure: ARTERIOVENOUS (AV) FISTULA CREATION (BRACHIALCEPHALIC);  Surgeon: Katha Cabal, MD;  Location: [facility] ORS;   Service: Vascular;  Laterality: Left;   COLONOSCOPY N/A 09/22/2021   Procedure: COLONOSCOPY;  Surgeon: Annamaria Helling, DO;  Location: [place];  Service: Gastroenterology;  Laterality: N/A;   DIALYSIS/PERMA CATHETER INSERTION N/A 02/23/2022   Procedure: DIALYSIS/PERMA CATHETER INSERTION;  Surgeon: Katha Cabal, MD;  Location: [place] CV LAB;  Service: Cardiovascular;  Laterality: N/A;   DIALYSIS/PERMA CATHETER REMOVAL N/A 05/21/2022   Procedure: DIALYSIS/PERMA CATHETER REMOVAL;  Surgeon: Algernon Huxley, MD;  Location: [place] CV LAB;  Service: Cardiovascular;  Laterality: N/A;   EYE SURGERY Bilateral    detatch retina   TEMPORARY DIALYSIS CATHETER Right 02/19/2022   Procedure: TEMPORARY DIALYSIS CATHETER;  Surgeon: Algernon Huxley, MD;  Location: [place] CV LAB;  Service: Cardiovascular;  Laterality: Right;    Social History Social History   Tobacco Use   Smoking status: Never    Passive exposure: Never   Smokeless tobacco: Never  Vaping Use   Vaping Use: Never used  Substance Use Topics   Alcohol use: Never   Drug use: Never    Family History Family History  Problem Relation Age of Onset   Coronary artery disease Mother    Hypertension Father    Prostate cancer Father     Allergies  Allergen Reactions   Pine Hives and Itching     REVIEW OF SYSTEMS (Negative unless checked)  Constitutional: [] Weight loss  [] Fever  [] Chills Cardiac: [] Chest pain   [] Chest pressure   []   Palpitations   [] Shortness of breath when laying flat   [] Shortness of breath at rest   [] Shortness of breath with exertion. Vascular:  [] Pain in legs with walking   [] Pain in legs at rest   [] Pain in legs when laying flat   [] Claudication   [] Pain in feet when walking  [] Pain in feet at rest  [] Pain in feet when laying flat   [] History of DVT   [] Phlebitis   [] Swelling in legs   [] Varicose veins   [] Non-healing ulcers Pulmonary:   [] Uses home oxygen   [] Productive cough    [] Hemoptysis   [] Wheeze  [] COPD   [] Asthma Neurologic:  [] Dizziness  [] Blackouts   [] Seizures   [] History of stroke   [] History of TIA  [] Aphasia   [] Temporary blindness   [] Dysphagia   [] Weakness or numbness in arms   [] Weakness or numbness in legs Musculoskeletal:  [] Arthritis   [] Joint swelling   [] Joint pain   [] Low back pain Hematologic:  [] Easy bruising  [] Easy bleeding   [] Hypercoagulable state   [] Anemic  [] Hepatitis Gastrointestinal:  [] Blood in stool   [] Vomiting blood  [] Gastroesophageal reflux/heartburn   [] Difficulty swallowing. Genitourinary:  [x] Chronic kidney disease   [] Difficult urination  [] Frequent urination  [] Burning with urination   [] Blood in urine Skin:  [] Rashes   [] Ulcers   [] Wounds Psychological:  [] History of anxiety   []  History of major depression.  Physical Examination  Vitals:   07/13/22 1232 07/13/22 1451  BP: (!) 202/91 (!) 209/94  Pulse: (!) 57 60  Resp: 17 17  Temp: 98 F (36.7 C) 98.1 F (36.7 C)  TempSrc: Oral Oral  SpO2: 100% 100%   There is no height or weight on file to calculate BMI. Gen:  WD/WN, NAD Head: [place]/AT, No temporalis wasting. Prominent temp pulse not noted. Ear/Nose/Throat: Hearing grossly intact, nares w/o erythema or drainage, oropharynx w/o Erythema/Exudate Eyes: Sclera non-icteric, conjunctiva clear Neck: Trachea midline.  No JVD.  Pulmonary:  Good air movement, respirations not labored, equal bilaterally.  Cardiac: RRR, normal S1, S2. Vascular: Strong thrill in left brachial cephalic AV fistula Vessel Right Left  Radial Palpable Palpable   Gastrointestinal: soft, non-tender/non-distended. No guarding/reflex.  Musculoskeletal: M/S 5/5 throughout.  Extremities without ischemic changes.  No deformity or atrophy. No edema. Neurologic: Sensation grossly intact in extremities.  Symmetrical.  Speech is fluent. Motor exam as listed above. Psychiatric: Judgment intact, Mood & affect appropriate for pt's clinical  situation. Dermatologic: No rashes or ulcers noted.  No cellulitis or open wounds. Lymph : No Cervical, Axillary, or Inguinal lymphadenopathy.    CBC Lab Results  Component Value Date   WBC 10.2 02/24/2022   HGB 9.0 (L) 02/24/2022   HCT 27.1 (L) 02/24/2022   MCV 90.3 02/24/2022   PLT 236 02/24/2022    BMET    Component Value Date/Time   NA 138 07/13/2022 1527   K 4.2 07/13/2022 1527   CL 102 07/13/2022 1527   CO2 25 07/13/2022 1527   GLUCOSE 109 (H) 07/13/2022 1527   BUN 62 (H) 07/13/2022 1527   CREATININE 6.80 (H) 07/13/2022 1527   CALCIUM 8.8 (L) 07/13/2022 1527   GFRNONAA 9 (L) 07/13/2022 1527   CrCl cannot be calculated (Unknown ideal weight.).  COAG Lab Results  Component Value Date   INR 1.2 01/02/2022   INR 0.9 08/18/2021   INR 1.0 01/05/2021    Radiology No results found.    Assessment/Plan 1. End stage renal disease  Today the patient  has a strong thrill on evaluation.  I suspect that the patient has not completely occluded.  However due to the ongoing difficulties we will plan on a PermCath placement today, due to the patient having eaten earlier.  We will allow the patient to be discharged today because he has dialysis tomorrow as long as his potassium is within a safe level.  We will contact the patient in order to have him scheduled for a fistulogram on an outpatient basis next week.  I discussed this with the patient and he is in agreement with the plan.  2.  Diabetes  Continue hypoglycemic medications as already ordered, these medications have been reviewed and there are no changes at this time.  Hgb A1C to be monitored as already arranged by primary service  3.  Hypertension  Continue antihypertensive medications as already ordered, these medications have been reviewed and there are no changes at this time.  Plan of care discussed with Dr. Lucky Cowboy and he is in agreement with plan noted above.   Family Communication: None at bedside  Total  Time:55 mins  I spent 55 minutes in this encounter including personally reviewing extensive medical records, personally reviewing imaging studies and compared to prior scans, counseling the patient, placing orders, coordinating care and performing appropriate documentation  Thank you for allowing [place] to participate in the care of this patient.   Kris Hartmann, NP [facility] [phone] (Office Phone) [phone] (Office Fax) [phone] (Pager)  07/13/2022 4:13 PM  Staff may message me via secure chat in [place]  but this may not receive immediate response,  please page for urgent matters!  Dictation software was used to generate the above note. Typos may occur and escape review, as with typed/written notes. Any error is purely unintentional.  Please contact me directly for clarity if needed.

## 2022-07-14 ENCOUNTER — Other Ambulatory Visit: Payer: Self-pay

## 2022-07-14 ENCOUNTER — Emergency Department
Admission: EM | Admit: 2022-07-14 | Discharge: 2022-07-14 | Disposition: A | Discharge status: Home / Self Care | Payer: BC Managed Care – PPO | Attending: Emergency Medicine | Admitting: Emergency Medicine

## 2022-07-14 DIAGNOSIS — N186 End stage renal disease: Secondary | ICD-10-CM

## 2022-07-14 DIAGNOSIS — T82898A Other specified complication of vascular prosthetic devices, implants and grafts, initial encounter: Secondary | ICD-10-CM | POA: Diagnosis not present

## 2022-07-14 DIAGNOSIS — T82838A Hemorrhage of vascular prosthetic devices, implants and grafts, initial encounter: Secondary | ICD-10-CM

## 2022-07-14 DIAGNOSIS — L905 Scar conditions and fibrosis of skin: Secondary | ICD-10-CM | POA: Diagnosis not present

## 2022-07-14 DIAGNOSIS — Z992 Dependence on renal dialysis: Secondary | ICD-10-CM | POA: Diagnosis not present

## 2022-07-14 LAB — COMPREHENSIVE METABOLIC PANEL
ALT: 12 U/L (ref 0–44)
AST: 17 U/L (ref 15–41)
Albumin: 4 g/dL (ref 3.5–5.0)
Alkaline Phosphatase: 69 U/L (ref 38–126)
Anion gap: 10 (ref 5–15)
BUN: 41 mg/dL — ABNORMAL HIGH (ref 6–20)
CO2: 29 mmol/L (ref 22–32)
Calcium: 8.4 mg/dL — ABNORMAL LOW (ref 8.9–10.3)
Chloride: 99 mmol/L (ref 98–111)
Creatinine, Ser: 4.93 mg/dL — ABNORMAL HIGH (ref 0.61–1.24)
GFR, Estimated: 14 mL/min — ABNORMAL LOW (ref >60)
Glucose, Bld: 108 mg/dL — ABNORMAL HIGH (ref 70–99)
Potassium: 3.6 mmol/L (ref 3.5–5.1)
Sodium: 138 mmol/L (ref 135–145)
Total Bilirubin: 0.6 mg/dL (ref 0.3–1.2)
Total Protein: 7.9 g/dL (ref 6.5–8.1)

## 2022-07-14 LAB — CBC WITH DIFFERENTIAL/PLATELET
Abs Immature Granulocytes: 0.02 10*3/uL (ref 0.00–0.07)
Basophils Absolute: 0.1 10*3/uL (ref 0.0–0.1)
Basophils Relative: 1 %
Eosinophils Absolute: 0.6 10*3/uL — ABNORMAL HIGH (ref 0.0–0.5)
Eosinophils Relative: 6 %
HCT: 32.6 % — ABNORMAL LOW (ref 39.0–52.0)
Hemoglobin: 10.5 g/dL — ABNORMAL LOW (ref 13.0–17.0)
Immature Granulocytes: 0 %
Lymphocytes Relative: 17 %
Lymphs Abs: 1.7 10*3/uL (ref 0.7–4.0)
MCH: 28.5 pg (ref 26.0–34.0)
MCHC: 32.2 g/dL (ref 30.0–36.0)
MCV: 88.3 fL (ref 80.0–100.0)
Monocytes Absolute: 0.9 10*3/uL (ref 0.1–1.0)
Monocytes Relative: 9 %
Neutro Abs: 6.7 10*3/uL (ref 1.7–7.7)
Neutrophils Relative %: 67 %
Platelets: 267 10*3/uL (ref 150–400)
RBC: 3.69 MIL/uL — ABNORMAL LOW (ref 4.22–5.81)
RDW: 15 % (ref 11.5–15.5)
WBC: 9.9 10*3/uL (ref 4.0–10.5)
nRBC: 0 % (ref 0.0–0.2)

## 2022-07-14 LAB — PROTIME-INR
INR: 1.1 (ref 0.8–1.2)
Prothrombin Time: 13.9 seconds (ref 11.4–15.2)

## 2022-07-14 LAB — APTT: aPTT: 32 seconds (ref 24–36)

## 2022-07-14 NOTE — Discharge Instructions (Addendum)
We replaced  catheter dressing and there is no longer any active signs of bleeding therefore we can continue to monitor your symptoms at home  Return to the ER for continued bleeding fevers or any other concerns

## 2022-07-14 NOTE — ED Triage Notes (Signed)
Pt states his dialysis catheter that was placed yesterday is bleeding. Minimal pain with it but bleeding through the gauze.

## 2022-07-14 NOTE — ED Provider Notes (Signed)
[facility] Provider Note    Event Date/Time   First MD Initiated Contact with Patient 07/14/22 1228     (approximate)   History   catether issues   HPI  Bradley Lopez is a 47 y.o. male with ESRD on dialysis who comes in with concerns for bleeding from his dialysis catheter.  He reports having continued bleeding.  He does report a half of his dialysis session today.  He stated that his potassium was 4.2 yesterday I did review the notes and this was accurate.  His last CBC was reviewed and was 9 -2 months ago.     Physical Exam   Triage Vital Signs: ED Triage Vitals  Enc Vitals Group     BP 07/14/22 1224 (!) 202/90     Pulse Rate 07/14/22 1224 63     Resp 07/14/22 1224 18     Temp 07/14/22 1224 97.9 F (36.6 C)     Temp Source 07/14/22 1224 Oral     SpO2 07/14/22 1224 100 %     Weight --      Height --      Head Circumference --      Peak Flow --      Pain Score 07/14/22 1225 3     Pain Loc --      Pain Edu? --      Excl. in [place]? --     Most recent vital signs: Vitals:   07/14/22 1224  BP: (!) 202/90  Pulse: 63  Resp: 18  Temp: 97.9 F (36.6 C)  SpO2: 100%     General: Awake, no distress.  CV:  Good peripheral perfusion.  Resp:  Normal effort.  Abd:  No distention.  Other:  Patient's dialysis catheter noted to have gauze with blood soaked on it.  Right chest wall   ED Results / Procedures / Treatments   Labs (all labs ordered are listed, but only abnormal results are displayed) Labs Reviewed  CBC WITH DIFFERENTIAL/PLATELET  PROTIME-INR  COMPREHENSIVE METABOLIC PANEL  APTT     PROCEDURES:  Critical Care performed: No  Procedures   MEDICATIONS ORDERED IN ED: Medications - No data to display   IMPRESSION / MDM / [place] / ED COURSE  I reviewed the triage vital signs and the nursing notes.   Patient's presentation is most consistent with acute, uncomplicated illness.   Patient was dressing was  taken down and then removed some of the clots that were around the entrance of the catheter.  This was done sterilely.  There is no continued bleeding noted.  Chlorhexidine applied-therefore it was redressed.  Will get some basic to evaluate for any coagulopathies but at this time bleeding seems to have resolved.  Suspect discharge home Coags are normal hemoglobin is slightly low but higher than his baseline at 10.5.   Potassium on CMP is normal.  3:19 PM reevaluated patient continues not have any bleeding feels comfortable with discharge home.  Blood pressure elevated but he will follow-up for recheck with his primary care doctor  FINAL CLINICAL IMPRESSION(S) / ED DIAGNOSES   Final diagnoses:  ESRD (end stage renal disease) ([place])  Bleeding due to dialysis catheter placement, initial encounter [facility])     Rx / DC Orders   ED Discharge Orders     None        Note:  This document was prepared using Dragon voice recognition software and may include unintentional dictation errors.  Vanessa [place], MD 07/14/22 [phone]

## 2022-07-15 ENCOUNTER — Encounter: Payer: Self-pay | Admitting: Vascular Surgery

## 2022-07-16 ENCOUNTER — Telehealth (INDEPENDENT_AMBULATORY_CARE_PROVIDER_SITE_OTHER): Payer: Self-pay

## 2022-07-16 NOTE — Telephone Encounter (Signed)
Spoke with the patient and he is scheduled with Dr. Delana Meyer on 07/17/22 with a 3:00 pm arrival time to the [facility] for a left arm fistulagram. Pre-procedure instructions were discussed and patient stated he understood.

## 2022-07-17 DIAGNOSIS — N186 End stage renal disease: Secondary | ICD-10-CM

## 2022-07-17 NOTE — Telephone Encounter (Signed)
Patient needed to be rescheduled and has been rescheduled to 07/24/22 with a 11:30 am arrival  time to the [facility]. Spoke with the patient and gave him the arrival time, patient stated he understood.

## 2022-07-24 ENCOUNTER — Encounter
Admission: RE | Disposition: A | Discharge status: Home / Self Care | Payer: Self-pay | Source: Home / Self Care | Attending: Vascular Surgery

## 2022-07-24 ENCOUNTER — Other Ambulatory Visit: Payer: Self-pay

## 2022-07-24 ENCOUNTER — Ambulatory Visit
Admission: RE | Admit: 2022-07-24 | Discharge: 2022-07-24 | Disposition: A | Discharge status: Home / Self Care | Payer: BC Managed Care – PPO | Attending: Vascular Surgery | Admitting: Vascular Surgery

## 2022-07-24 ENCOUNTER — Encounter: Payer: Self-pay | Admitting: Vascular Surgery

## 2022-07-24 DIAGNOSIS — E1122 Type 2 diabetes mellitus with diabetic chronic kidney disease: Secondary | ICD-10-CM | POA: Insufficient documentation

## 2022-07-24 DIAGNOSIS — T82898A Other specified complication of vascular prosthetic devices, implants and grafts, initial encounter: Secondary | ICD-10-CM

## 2022-07-24 DIAGNOSIS — Z992 Dependence on renal dialysis: Secondary | ICD-10-CM | POA: Insufficient documentation

## 2022-07-24 DIAGNOSIS — N186 End stage renal disease: Secondary | ICD-10-CM | POA: Insufficient documentation

## 2022-07-24 DIAGNOSIS — T82510A Breakdown (mechanical) of surgically created arteriovenous fistula, initial encounter: Secondary | ICD-10-CM | POA: Diagnosis present

## 2022-07-24 DIAGNOSIS — I120 Hypertensive chronic kidney disease with stage 5 chronic kidney disease or end stage renal disease: Secondary | ICD-10-CM | POA: Diagnosis not present

## 2022-07-24 DIAGNOSIS — Y841 Kidney dialysis as the cause of abnormal reaction of the patient, or of later complication, without mention of misadventure at the time of the procedure: Secondary | ICD-10-CM | POA: Insufficient documentation

## 2022-07-24 DIAGNOSIS — J45909 Unspecified asthma, uncomplicated: Secondary | ICD-10-CM | POA: Diagnosis not present

## 2022-07-24 HISTORY — PX: A/V FISTULAGRAM: CATH118298

## 2022-07-24 LAB — POTASSIUM (ARMC VASCULAR LAB ONLY): Potassium (ARMC vascular lab): 4.4 mmol/L (ref 3.5–5.1)

## 2022-07-24 LAB — GLUCOSE, CAPILLARY
Glucose-Capillary: 63 mg/dL — ABNORMAL LOW (ref 70–99)
Glucose-Capillary: 90 mg/dL (ref 70–99)

## 2022-07-24 SURGERY — A/V FISTULAGRAM
Anesthesia: Moderate Sedation | Laterality: Left

## 2022-07-24 MED ORDER — DEXTROSE 50 % IV SOLN
12.5000 g | INTRAVENOUS | Status: AC
  Administered 2022-07-24: 12.5 g via INTRAVENOUS

## 2022-07-24 MED ORDER — METHYLPREDNISOLONE SODIUM SUCC 125 MG IJ SOLR: 125.0000 mg | Freq: Once | INTRAMUSCULAR | Status: DC | PRN

## 2022-07-24 MED ORDER — MIDAZOLAM HCL 2 MG/ML PO SYRP: 8.0000 mg | ORAL_SOLUTION | Freq: Once | ORAL | Status: DC | PRN

## 2022-07-24 MED ORDER — HYDROMORPHONE HCL 1 MG/ML IJ SOLN: 1.0000 mg | Freq: Once | INTRAMUSCULAR | Status: DC | PRN

## 2022-07-24 MED ORDER — FENTANYL CITRATE (PF) 100 MCG/2ML IJ SOLN
INTRAMUSCULAR | Status: AC
  Filled 2022-07-24: qty 2

## 2022-07-24 MED ORDER — MIDAZOLAM HCL 5 MG/5ML IJ SOLN
INTRAMUSCULAR | Status: AC
  Filled 2022-07-24: qty 5

## 2022-07-24 MED ORDER — FAMOTIDINE 20 MG PO TABS: 40.0000 mg | ORAL_TABLET | Freq: Once | ORAL | Status: DC | PRN

## 2022-07-24 MED ORDER — IODIXANOL 320 MG/ML IV SOLN
INTRAVENOUS | Status: DC | PRN
  Administered 2022-07-24: 20 mL

## 2022-07-24 MED ORDER — MIDAZOLAM HCL 2 MG/2ML IJ SOLN
INTRAMUSCULAR | Status: DC | PRN
  Administered 2022-07-24: 2 mg via INTRAVENOUS

## 2022-07-24 MED ORDER — DEXTROSE 50 % IV SOLN
INTRAVENOUS | Status: AC
  Filled 2022-07-24: qty 50

## 2022-07-24 MED ORDER — ONDANSETRON HCL 4 MG/2ML IJ SOLN: 4.0000 mg | Freq: Four times a day (QID) | INTRAMUSCULAR | Status: DC | PRN

## 2022-07-24 MED ORDER — SODIUM CHLORIDE 0.9 % IV SOLN: INTRAVENOUS | Status: DC

## 2022-07-24 MED ORDER — HEPARIN SODIUM (PORCINE) 1000 UNIT/ML IJ SOLN
INTRAMUSCULAR | Status: AC
  Filled 2022-07-24: qty 10

## 2022-07-24 MED ORDER — CEFAZOLIN SODIUM-DEXTROSE 1-4 GM/50ML-% IV SOLN
INTRAVENOUS | Status: AC
  Administered 2022-07-24: 1 g via INTRAVENOUS
  Filled 2022-07-24: qty 50

## 2022-07-24 MED ORDER — DIPHENHYDRAMINE HCL 50 MG/ML IJ SOLN: 50.0000 mg | Freq: Once | INTRAMUSCULAR | Status: DC | PRN

## 2022-07-24 MED ORDER — SODIUM CHLORIDE FLUSH 0.9 % IV SOLN
INTRAVENOUS | Status: AC
  Filled 2022-07-24: qty 60

## 2022-07-24 MED ORDER — CEFAZOLIN SODIUM-DEXTROSE 1-4 GM/50ML-% IV SOLN: 1.0000 g | INTRAVENOUS | Status: AC

## 2022-07-24 MED ORDER — FENTANYL CITRATE (PF) 100 MCG/2ML IJ SOLN
INTRAMUSCULAR | Status: DC | PRN
  Administered 2022-07-24: 50 ug via INTRAVENOUS

## 2022-07-24 SURGICAL SUPPLY — 11 items
DRAPE BRACHIAL (DRAPES) IMPLANT
GOWN STRL REUS W/ TWL LRG LVL3 (GOWN DISPOSABLE) ×1 IMPLANT
GOWN STRL REUS W/TWL LRG LVL3 (GOWN DISPOSABLE) ×1
NDL ENTRY 21GA 7CM ECHOTIP (NEEDLE) IMPLANT
NEEDLE ENTRY 21GA 7CM ECHOTIP (NEEDLE) ×1 IMPLANT
PACK ANGIOGRAPHY (CUSTOM PROCEDURE TRAY) ×1 IMPLANT
SET INTRO CAPELLA COAXIAL (SET/KITS/TRAYS/PACK) IMPLANT
SHEATH BRITE TIP 6FRX5.5 (SHEATH) IMPLANT
SUT MNCRL 4-0 (SUTURE) ×1
SUT MNCRL 4-0 27XMFL (SUTURE) ×1
SUTURE MNCRL 4-0 27XMF (SUTURE) IMPLANT

## 2022-07-24 NOTE — OR Nursing (Signed)
Patient Blood Sugar 63, per protocol ordered D50 and gave 1/2 amp per order, Dr Delana Meyer made aware and patient is asymptomatic.  Ortencia Kick, RN

## 2022-07-24 NOTE — Inpatient Diabetes Management (Signed)
Inpatient Diabetes Program Recommendations  AACE/ADA: New Consensus Statement on Inpatient Glycemic Control (2015)  Target Ranges:  Prepandial:   less than 140 mg/dL      Peak postprandial:   less than 180 mg/dL (1-2 hours)      Critically ill patients:  140 - 180 mg/dL   Lab Results  Component Value Date   GLUCAP 90 07/24/2022   HGBA1C 8.3 (H) 01/06/2021    Review of Glycemic Control  Latest Reference Range & Units 07/24/22 12:51 07/24/22 13:27  Glucose-Capillary 70 - 99 mg/dL 63 (L) 90  (L): Data is abnormally low Diabetes history: Type 2 DM Outpatient Diabetes medications: Glipizide 5 mg QD, Lantus 18 units QD (NT) Current orders for Inpatient glycemic control:none, Solumedrol 125 mg x 1 PRN for contrast allergy  Inpatient Diabetes Program Recommendations:    Noted hypoglycemia of 63 mg/dL and associated interventions. Consider adding CBGs TID.   Thanks, Bronson Curb, MSN, RNC-OB Diabetes Coordinator [phone] (8a-5p)

## 2022-07-24 NOTE — OR Nursing (Addendum)
Patient and wife ready to discharge, Dr Delana Meyer still in a procedure, he states he will review pictures with patient in postop appointment, states to uses dialysis cath for now, and patients BP was high but Dr Delana Meyer states ok to go home and follow up with PCP, patient does have dialysis tomorrow, patient was able to tolerated food and drink, patient discharged home with wife in stable condition with all belongings.  Ortencia Kick, RN

## 2022-07-24 NOTE — Discharge Instructions (Signed)
Fistulagram, Care After Refer to this sheet in the next few weeks. These instructions provide you with information on caring for yourself after your procedure. Your health care provider may also give you more specific instructions. Your treatment has been planned according to current medical practices, but problems sometimes occur. Call your health care provider if you have any problems or questions after your procedure. What can I expect after the procedure? After your procedure, it is typical to have the following:  A small amount of discomfort in the area where the catheters were placed.  A small amount of bruising around the fistula.  Sleepiness and fatigue.  Follow these instructions at home:  Rest at home for the day following your procedure.  Do not drive or operate heavy machinery while taking pain medicine.  Take medicines only as directed by your health care provider.  Do not take baths, swim, or use a hot tub until your health care provider approves. You may shower 24 hours after the procedure or as directed by your health care provider.  There are many different ways to close and cover an incision, including stitches, skin glue, and adhesive strips. Follow your health care provider's instructions on: ? Incision care. ? Bandage (dressing) changes and removal. ? Incision closure removal.  Monitor your dialysis fistula carefully. Contact a health care provider if:  You have drainage, redness, swelling, or pain at your catheter site.  You have a fever.  You have chills. Get help right away if:  You feel weak.  You have trouble balancing.  You have trouble moving your arms or legs.  You have problems with your speech or vision.  You can no longer feel a vibration or buzz when you put your fingers over your dialysis fistula.  The limb that was used for the procedure: ? Swells. ? Is painful. ? Is cold. ? Is discolored, such as blue or pale white. This  information is not intended to replace advice given to you by your health care provider. Make sure you discuss any questions you have with your health care provider. Document Released: 08/24/2013 Document Revised: 09/15/2015 Document Reviewed: 05/29/2013 Elsevier Interactive Patient Education  2018 [employer] 

## 2022-07-24 NOTE — Op Note (Signed)
OPERATIVE NOTE   PROCEDURE: Contrast injection left arm brachiocephalic fistula  PRE-OPERATIVE DIAGNOSIS: Complication of dialysis access                                                       End Stage Renal Disease  POST-OPERATIVE DIAGNOSIS: same as above   SURGEON: Katha Cabal, M.D.  ANESTHESIA: Conscious sedation was administered under my direct supervision by the interventional radiology RN.  IV Versed plus fentanyl were utilized. Continuous ECG, pulse oximetry and blood pressure was monitored throughout the entire procedure.  Conscious sedation was for a total of 24 minutes.  ESTIMATED BLOOD LOSS: minimal  FINDING(S): Cephalic vein is straight there were are very few side branches.  The vein itself measures 7 mm in diameter.  SPECIMEN(S):  None  CONTRAST: 20 cc  FLUOROSCOPY TIME: 0.4 minutes  INDICATIONS: Bradley Lopez is a 47 y.o. male who  presents with malfunctioning left arm AV access.  The patient is scheduled for angiography with possible intervention of the AV access to prevent loss of the permanent access.  The patient is aware the risks include but are not limited to: bleeding, infection, thrombosis of the cannulated access, and possible anaphylactic reaction to the contrast.  The patient acknowledges if the access can not be salvaged a tunneled catheter will be needed and will be placed during this procedure.  The patient is aware of the risks of the procedure and elects to proceed with the angiogram and intervention.  DESCRIPTION: After full informed written consent was obtained, the patient was brought back to the Special Procedure suite and placed supine position.  Appropriate cardiopulmonary monitors were placed.  The left arm was prepped and draped in the standard fashion.  Appropriate timeout is called.   The left brachial cephalic access was cannulated with a micropuncture needle under ultrasound guidence.  Ultrasound was used to evaluate the left arm  access.  It was echolucent and compressible indicating it is patent .  An ultrasound image was acquired for the permanent record.  A micropuncture needle was used to access the brachiocephalic fistula under direct ultrasound guidance.  The microwire was then advanced under fluoroscopic guidance without difficulty followed by the micro-sheath.  The J-wire was then advanced and a 6 Fr sheath inserted.  Hand injections were completed to image the access from the arterial anastomosis through the entire access.  The central venous structures were also imaged by hand injections.  Based on the images,  Cephalic vein is straight there were are very few side branches.  The vein itself measures 7 mm in diameter.  At the level of the arterial anastomosis the vein is smaller but still measures approximately 4 mm it does not appear to be sclerotic.  The central veins are all widely patent  A 4-0 Monocryl purse-string suture was sewn around the sheath.  The sheath was removed and light pressure was applied.  A sterile bandage was applied to the puncture site.    COMPLICATIONS: None  CONDITION: [name], M.D [facility] Office: [phone]  07/24/2022 2:37 PM

## 2022-07-24 NOTE — Interval H&P Note (Signed)
History and Physical Interval Note:  07/24/2022 12:22 PM  Bradley Lopez  has presented today for surgery, with the diagnosis of L Fistula   End stage renal.  The various methods of treatment have been discussed with the patient and family. After consideration of risks, benefits and other options for treatment, the patient has consented to  Procedure(s): A/V Fistulagram (Left) as a surgical intervention.  The patient's history has been reviewed, patient examined, no change in status, stable for surgery.  I have reviewed the patient's chart and labs.  Questions were answered to the patient's satisfaction.     Hortencia Pilar

## 2022-07-25 ENCOUNTER — Encounter: Payer: Self-pay | Admitting: Vascular Surgery

## 2022-08-16 ENCOUNTER — Ambulatory Visit (INDEPENDENT_AMBULATORY_CARE_PROVIDER_SITE_OTHER): Payer: BC Managed Care – PPO

## 2022-08-16 ENCOUNTER — Encounter (INDEPENDENT_AMBULATORY_CARE_PROVIDER_SITE_OTHER): Payer: Self-pay | Admitting: Nurse Practitioner

## 2022-08-16 ENCOUNTER — Ambulatory Visit (INDEPENDENT_AMBULATORY_CARE_PROVIDER_SITE_OTHER): Payer: BC Managed Care – PPO | Admitting: Nurse Practitioner

## 2022-08-16 VITALS — BP 117/66 | HR 57 | Resp 18 | Ht 75.0 in | Wt 192.4 lb

## 2022-08-16 DIAGNOSIS — I10 Essential (primary) hypertension: Secondary | ICD-10-CM

## 2022-08-16 DIAGNOSIS — N186 End stage renal disease: Secondary | ICD-10-CM

## 2022-08-16 DIAGNOSIS — E1122 Type 2 diabetes mellitus with diabetic chronic kidney disease: Secondary | ICD-10-CM | POA: Diagnosis not present

## 2022-08-27 ENCOUNTER — Encounter (INDEPENDENT_AMBULATORY_CARE_PROVIDER_SITE_OTHER): Payer: Self-pay | Admitting: Nurse Practitioner

## 2022-08-27 ENCOUNTER — Telehealth (INDEPENDENT_AMBULATORY_CARE_PROVIDER_SITE_OTHER): Payer: Self-pay

## 2022-08-27 NOTE — H&P (View-Only) (Signed)
Subjective:    Patient ID: Bradley Lopez, male    DOB: November 13, 1975, 47 y.o.   MRN: [id number] Chief Complaint  Patient presents with   Follow-up    fu after procedure with HDA in 2 weeks    The patient returns to the office for followup of their dialysis access.  The patient was sent to the emergency room due to having clots pulled from his fistula.  He underwent a fistulogram but there was no significant stenosis noted.  He had a PermCath placed that time and has been utilizing a PermCath since that time.   The patient denies hand pain or other symptoms consistent with steal phenomena.  No significant arm swelling.  The patient denies any complaints from the dialysis center or their nephrologist.  The patient denies redness or swelling at the access site. The patient denies fever or chills at home or while on dialysis.  No recent shortening of the patient's walking distance or new symptoms consistent with claudication.  No history of rest pain symptoms. No new ulcers or wounds of the lower extremities have occurred.  The patient denies amaurosis fugax or recent TIA symptoms. There are no recent neurological changes noted. There is no history of DVT, PE or superficial thrombophlebitis. No recent episodes of angina or shortness of breath documented.   Duplex ultrasound of the AV access shows a patent access.  The previously noted stenosis is not significantly changed compared to last study.  Flow volume today is 1062 cc/min (previous flow volume was 996 cc/min)      Review of Systems  All other systems reviewed and are negative.      Objective:   Physical Exam Vitals reviewed.  HENT:     Head: Normocephalic.  Cardiovascular:     Rate and Rhythm: Normal rate.     Pulses: Normal pulses.          Radial pulses are 2+ on the left side.     Arteriovenous access: Left arteriovenous access is present.    Comments: Good thrill and bruit Pulmonary:     Effort: Pulmonary effort is  normal.  Skin:    General: Skin is warm and dry.  Neurological:     Mental Status: He is alert and oriented to person, place, and time.  Psychiatric:        Mood and Affect: Mood normal.        Behavior: Behavior normal.        Thought Content: Thought content normal.        Judgment: Judgment normal.     BP 117/66 (BP Location: Right Arm)   Pulse (!) 57   Resp 18   Ht 6\' 3"  (1.905 m)   Wt 192 lb 6.4 oz (87.3 kg)   BMI 24.05 kg/m   Past Medical History:  Diagnosis Date   Allergy    Anemia    Asthma    Chronic kidney disease    Depression    situational   Diabetes mellitus without complication (HCC)    Hypertension    Pneumonia     Social History   Socioeconomic History   Marital status: Married    Spouse name: Elnita Maxwell   Number of children: Not on file   Years of education: Not on file   Highest education level: Not on file  Occupational History   Not on file  Tobacco Use   Smoking status: Never    Passive exposure: Never   Smokeless  tobacco: Never  Vaping Use   Vaping Use: Never used  Substance and Sexual Activity   Alcohol use: Never   Drug use: Never   Sexual activity: Yes  Other Topics Concern   Not on file  Social History Narrative   Not on file   Social Determinants of Health   Financial Resource Strain: Not on file  Food Insecurity: No Food Insecurity (02/20/2022)   Hunger Vital Sign    Worried About Running Out of Food in the Last Year: Never true    Ran Out of Food in the Last Year: Never true  Transportation Needs: No Transportation Needs (02/20/2022)   PRAPARE - Administrator, Civil Service (Medical): No    Lack of Transportation (Non-Medical): No  Physical Activity: Not on file  Stress: Not on file  Social Connections: Not on file  Intimate Partner Violence: Not At Risk (02/20/2022)   Humiliation, Afraid, Rape, and Kick questionnaire    Fear of Current or Ex-Partner: No    Emotionally Abused: No    Physically Abused:  No    Sexually Abused: No    Past Surgical History:  Procedure Laterality Date   A/V FISTULAGRAM N/A 02/23/2022   Procedure: A/V Fistulagram;  Surgeon: Renford Dills, MD;  Location: [facility] INVASIVE CV LAB;  Service: Cardiovascular;  Laterality: N/A;   A/V FISTULAGRAM Left 07/24/2022   Procedure: A/V Fistulagram;  Surgeon: Renford Dills, MD;  Location: [facility] INVASIVE CV LAB;  Service: Cardiovascular;  Laterality: Left;   AV FISTULA PLACEMENT Left 01/03/2022   Procedure: ARTERIOVENOUS (AV) FISTULA CREATION (BRACHIALCEPHALIC);  Surgeon: Renford Dills, MD;  Location: [facility] ORS;  Service: Vascular;  Laterality: Left;   COLONOSCOPY N/A 09/22/2021   Procedure: COLONOSCOPY;  Surgeon: Jaynie Collins, DO;  Location: [facility] ENDOSCOPY;  Service: Gastroenterology;  Laterality: N/A;   DIALYSIS/PERMA CATHETER INSERTION N/A 02/23/2022   Procedure: DIALYSIS/PERMA CATHETER INSERTION;  Surgeon: Renford Dills, MD;  Location: [facility] INVASIVE CV LAB;  Service: Cardiovascular;  Laterality: N/A;   DIALYSIS/PERMA CATHETER INSERTION N/A 07/13/2022   Procedure: DIALYSIS/PERMA CATHETER INSERTION;  Surgeon: Annice Needy, MD;  Location: [facility] INVASIVE CV LAB;  Service: Cardiovascular;  Laterality: N/A;   DIALYSIS/PERMA CATHETER REMOVAL N/A 05/21/2022   Procedure: DIALYSIS/PERMA CATHETER REMOVAL;  Surgeon: Annice Needy, MD;  Location: [facility] INVASIVE CV LAB;  Service: Cardiovascular;  Laterality: N/A;   EYE SURGERY Bilateral    detatch retina   TEMPORARY DIALYSIS CATHETER Right 02/19/2022   Procedure: TEMPORARY DIALYSIS CATHETER;  Surgeon: Annice Needy, MD;  Location: [facility] INVASIVE CV LAB;  Service: Cardiovascular;  Laterality: Right;    Family History  Problem Relation Age of Onset   Coronary artery disease Mother    Hypertension Father    Prostate cancer Father     Allergies  Allergen Reactions   Pine Hives and Itching       Latest Ref Rng & Units 07/14/2022   12:57 PM 02/24/2022    4:59 AM  02/21/2022    4:26 AM  CBC  WBC 4.0 - 10.5 K/uL 9.9  10.2  11.0   Hemoglobin 13.0 - 17.0 g/dL 16.1  9.0  9.0   Hematocrit 39.0 - 52.0 % 32.6  27.1  27.9   Platelets 150 - 400 K/uL 267  236  292       CMP     Component Value Date/Time   NA 138 07/14/2022 1257   K 3.6 07/14/2022 1257   CL 99  07/14/2022 1257   CO2 29 07/14/2022 1257   GLUCOSE 108 (H) 07/14/2022 1257   BUN 41 (H) 07/14/2022 1257   CREATININE 4.93 (H) 07/14/2022 1257   CALCIUM 8.4 (L) 07/14/2022 1257   PROT 7.9 07/14/2022 1257   ALBUMIN 4.0 07/14/2022 1257   AST 17 07/14/2022 1257   ALT 12 07/14/2022 1257   ALKPHOS 69 07/14/2022 1257   BILITOT 0.6 07/14/2022 1257   GFRNONAA 14 (L) 07/14/2022 1257     No results found.     Assessment & Plan:   1. ESRD (end stage renal disease) (HCC) Since the patient's last HD a he has not been utilizing his fistula they have been slowly utilizing his PermCath.  He has good flow volume with no areas of any significant stenosis.  Patient is advised to utilize his fistula but we will preemptively schedule a fistulogram.  I suspect if anything the patient may need a little assistance with some maturation of the access.  If the fistula is working well then we can cancel a fistulogram.  We discussed the risk, benefits alternatives and the patient is agreeable to proceed.  2. Essential hypertension Continue antihypertensive medications as already ordered, these medications have been reviewed and there are no changes at this time.  3. 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  Hgb A1C to be monitored as already arranged by primary service   Current Outpatient Medications on File Prior to Visit  Medication Sig Dispense Refill   albuterol (VENTOLIN HFA) 108 (90 Base) MCG/ACT inhaler Inhale 2 puffs into the lungs every 6 (six)  hours as needed.     aspirin EC 81 MG EC tablet Take 1 tablet (81 mg total) by mouth daily. Swallow whole. 30 tablet 11   atorvastatin (LIPITOR) 40 MG tablet Take 40 mg by mouth daily.     atorvastatin (LIPITOR) 80 MG tablet Take 1 tablet (80 mg total) by mouth daily. 30 tablet 1   B Complex-C-Folic Acid (RENA-VITE RX) 1 MG TABS Take 1 tablet by mouth daily.     calcitRIOL (ROCALTROL) 0.5 MCG capsule Take 0.5 mcg by mouth daily.     cloNIDine (CATAPRES) 0.3 MG tablet Take 1 tablet (0.3 mg total) by mouth 2 (two) times daily. 60 tablet 11   glipiZIDE (GLUCOTROL XL) 5 MG 24 hr tablet Take 5 mg by mouth daily.     insulin detemir (LEVEMIR) 100 UNIT/ML injection Inject 0.1 mLs (10 Units total) into the skin daily after breakfast. (Patient taking differently: Inject 20 Units into the skin daily after breakfast.) 10 mL 11   lidocaine-prilocaine (EMLA) cream Apply 1 Application topically daily.     metolazone (ZAROXOLYN) 5 MG tablet Take 5 mg by mouth daily.     metoprolol tartrate (LOPRESSOR) 100 MG tablet Take 100 mg by mouth 2 (two) times daily.     midodrine (PROAMATINE) 5 MG tablet Take 1 tablet (5 mg total) by mouth every Monday, Wednesday, and Friday with hemodialysis. 90 tablet 1   mirtazapine (REMERON) 15 MG tablet Take 15 mg by mouth at bedtime.     multivitamin (RENA-VIT) TABS tablet Take 1 tablet by mouth at bedtime. 30 tablet 1   sertraline (ZOLOFT) 50 MG tablet Take 50 mg by mouth daily.     sevelamer carbonate (RENVELA) 800 MG tablet Take 800 mg by mouth 2 (two) times  daily with a meal.     ursodiol (ACTIGALL) 300 MG capsule Take by mouth.     amLODipine (NORVASC) 10 MG tablet Take 1 tablet (10 mg total) by mouth daily. (Patient not taking: Reported on 07/13/2022) 30 tablet 1   carvedilol (COREG) 12.5 MG tablet Take 1 tablet (12.5 mg total) by mouth 2 (two) times daily. (Patient not taking: Reported on 07/13/2022) 60 tablet 1   furosemide (LASIX) 40 MG tablet Take 1 tablet (40 mg total) by  mouth 2 (two) times daily. (Patient not taking: Reported on 07/13/2022) 30 tablet 0   LANTUS SOLOSTAR 100 UNIT/ML Solostar Pen Inject 18 Units into the skin at bedtime. (Patient not taking: Reported on 07/24/2022)     LEVEMIR FLEXPEN 100 UNIT/ML FlexPen Inject 18 Units into the skin daily. (Patient not taking: Reported on 07/24/2022)     losartan (COZAAR) 100 MG tablet Take 1 tablet (100 mg total) by mouth daily. (Patient not taking: Reported on 07/13/2022) 30 tablet 1   prednisoLONE acetate (PRED FORTE) 1 % ophthalmic suspension SMARTSIG:In Eye(s) (Patient not taking: Reported on 07/13/2022)     No current facility-administered medications on file prior to visit.    There are no Patient Instructions on file for this visit. No follow-ups on file.   Georgiana Spinner, NP

## 2022-08-27 NOTE — Progress Notes (Signed)
 Subjective:    Patient ID: Bradley Lopez, male    DOB: 07/31/1975, 47 y.o.   MRN: [id number] Chief Complaint  Patient presents with   Follow-up    fu after procedure with HDA in 2 weeks    The patient returns to the office for followup of their dialysis access.  The patient was sent to the emergency room due to having clots pulled from his fistula.  He underwent a fistulogram but there was no significant stenosis noted.  He had a PermCath placed that time and has been utilizing a PermCath since that time.   The patient denies hand pain or other symptoms consistent with steal phenomena.  No significant arm swelling.  The patient denies any complaints from the dialysis center or their nephrologist.  The patient denies redness or swelling at the access site. The patient denies fever or chills at home or while on dialysis.  No recent shortening of the patient's walking distance or new symptoms consistent with claudication.  No history of rest pain symptoms. No new ulcers or wounds of the lower extremities have occurred.  The patient denies amaurosis fugax or recent TIA symptoms. There are no recent neurological changes noted. There is no history of DVT, PE or superficial thrombophlebitis. No recent episodes of angina or shortness of breath documented.   Duplex ultrasound of the AV access shows a patent access.  The previously noted stenosis is not significantly changed compared to last study.  Flow volume today is 1062 cc/min (previous flow volume was 996 cc/min)      Review of Systems  All other systems reviewed and are negative.      Objective:   Physical Exam Vitals reviewed.  HENT:     Head: Normocephalic.  Cardiovascular:     Rate and Rhythm: Normal rate.     Pulses: Normal pulses.          Radial pulses are 2+ on the left side.     Arteriovenous access: Left arteriovenous access is present.    Comments: Good thrill and bruit Pulmonary:     Effort: Pulmonary effort is  normal.  Skin:    General: Skin is warm and dry.  Neurological:     Mental Status: He is alert and oriented to person, place, and time.  Psychiatric:        Mood and Affect: Mood normal.        Behavior: Behavior normal.        Thought Content: Thought content normal.        Judgment: Judgment normal.     BP 117/66 (BP Location: Right Arm)   Pulse (!) 57   Resp 18   Ht 6' 3" (1.905 m)   Wt 192 lb 6.4 oz (87.3 kg)   BMI 24.05 kg/m   Past Medical History:  Diagnosis Date   Allergy    Anemia    Asthma    Chronic kidney disease    Depression    situational   Diabetes mellitus without complication (HCC)    Hypertension    Pneumonia     Social History   Socioeconomic History   Marital status: Married    Spouse name: Cheryl   Number of children: Not on file   Years of education: Not on file   Highest education level: Not on file  Occupational History   Not on file  Tobacco Use   Smoking status: Never    Passive exposure: Never   Smokeless   tobacco: Never  Vaping Use   Vaping Use: Never used  Substance and Sexual Activity   Alcohol use: Never   Drug use: Never   Sexual activity: Yes  Other Topics Concern   Not on file  Social History Narrative   Not on file   Social Determinants of Health   Financial Resource Strain: Not on file  Food Insecurity: No Food Insecurity (02/20/2022)   Hunger Vital Sign    Worried About Running Out of Food in the Last Year: Never true    Ran Out of Food in the Last Year: Never true  Transportation Needs: No Transportation Needs (02/20/2022)   PRAPARE - Transportation    Lack of Transportation (Medical): No    Lack of Transportation (Non-Medical): No  Physical Activity: Not on file  Stress: Not on file  Social Connections: Not on file  Intimate Partner Violence: Not At Risk (02/20/2022)   Humiliation, Afraid, Rape, and Kick questionnaire    Fear of Current or Ex-Partner: No    Emotionally Abused: No    Physically Abused:  No    Sexually Abused: No    Past Surgical History:  Procedure Laterality Date   A/V FISTULAGRAM N/A 02/23/2022   Procedure: A/V Fistulagram;  Surgeon: Schnier, Gregory G, MD;  Location: [facility];  Service: Cardiovascular;  Laterality: N/A;   A/V FISTULAGRAM Left 07/24/2022   Procedure: A/V Fistulagram;  Surgeon: Schnier, Gregory G, MD;  Location: [facility];  Service: Cardiovascular;  Laterality: Left;   AV FISTULA PLACEMENT Left 01/03/2022   Procedure: ARTERIOVENOUS (AV) FISTULA CREATION (BRACHIALCEPHALIC);  Surgeon: Schnier, Gregory G, MD;  Location: [facility];  Service: Vascular;  Laterality: Left;   COLONOSCOPY N/A 09/22/2021   Procedure: COLONOSCOPY;  Surgeon: Russo, Steven Michael, DO;  Location: [facility];  Service: Gastroenterology;  Laterality: N/A;   DIALYSIS/PERMA CATHETER INSERTION N/A 02/23/2022   Procedure: DIALYSIS/PERMA CATHETER INSERTION;  Surgeon: Schnier, Gregory G, MD;  Location: [facility];  Service: Cardiovascular;  Laterality: N/A;   DIALYSIS/PERMA CATHETER INSERTION N/A 07/13/2022   Procedure: DIALYSIS/PERMA CATHETER INSERTION;  Surgeon: Dew, Jason S, MD;  Location: [facility];  Service: Cardiovascular;  Laterality: N/A;   DIALYSIS/PERMA CATHETER REMOVAL N/A 05/21/2022   Procedure: DIALYSIS/PERMA CATHETER REMOVAL;  Surgeon: Dew, Jason S, MD;  Location: [facility];  Service: Cardiovascular;  Laterality: N/A;   EYE SURGERY Bilateral    detatch retina   TEMPORARY DIALYSIS CATHETER Right 02/19/2022   Procedure: TEMPORARY DIALYSIS CATHETER;  Surgeon: Dew, Jason S, MD;  Location: [facility];  Service: Cardiovascular;  Laterality: Right;    Family History  Problem Relation Age of Onset   Coronary artery disease Mother    Hypertension Father    Prostate cancer Father     Allergies  Allergen Reactions   Pine Hives and Itching       Latest Ref Rng & Units 07/14/2022   12:57 PM 02/24/2022    4:59 AM  02/21/2022    4:26 AM  CBC  WBC 4.0 - 10.5 K/uL 9.9  10.2  11.0   Hemoglobin 13.0 - 17.0 g/dL 10.5  9.0  9.0   Hematocrit 39.0 - 52.0 % 32.6  27.1  27.9   Platelets 150 - 400 K/uL 267  236  292       CMP     Component Value Date/Time   NA 138 07/14/2022 1257   K 3.6 07/14/2022 1257   CL 99   07/14/2022 1257   CO2 29 07/14/2022 1257   GLUCOSE 108 (H) 07/14/2022 1257   BUN 41 (H) 07/14/2022 1257   CREATININE 4.93 (H) 07/14/2022 1257   CALCIUM 8.4 (L) 07/14/2022 1257   PROT 7.9 07/14/2022 1257   ALBUMIN 4.0 07/14/2022 1257   AST 17 07/14/2022 1257   ALT 12 07/14/2022 1257   ALKPHOS 69 07/14/2022 1257   BILITOT 0.6 07/14/2022 1257   GFRNONAA 14 (L) 07/14/2022 1257     No results found.     Assessment & Plan:   1. ESRD (end stage renal disease) (HCC) Since the patient's last HD a he has not been utilizing his fistula they have been slowly utilizing his PermCath.  He has good flow volume with no areas of any significant stenosis.  Patient is advised to utilize his fistula but we will preemptively schedule a fistulogram.  I suspect if anything the patient may need a little assistance with some maturation of the access.  If the fistula is working well then we can cancel a fistulogram.  We discussed the risk, benefits alternatives and the patient is agreeable to proceed.  2. Essential hypertension Continue antihypertensive medications as already ordered, these medications have been reviewed and there are no changes at this time.  3. 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  Hgb A1C to be monitored as already arranged by primary service   Current Outpatient Medications on File Prior to Visit  Medication Sig Dispense Refill   albuterol (VENTOLIN HFA) 108 (90 Base) MCG/ACT inhaler Inhale 2 puffs into the lungs every 6 (six)  hours as needed.     aspirin EC 81 MG EC tablet Take 1 tablet (81 mg total) by mouth daily. Swallow whole. 30 tablet 11   atorvastatin (LIPITOR) 40 MG tablet Take 40 mg by mouth daily.     atorvastatin (LIPITOR) 80 MG tablet Take 1 tablet (80 mg total) by mouth daily. 30 tablet 1   B Complex-C-Folic Acid (RENA-VITE RX) 1 MG TABS Take 1 tablet by mouth daily.     calcitRIOL (ROCALTROL) 0.5 MCG capsule Take 0.5 mcg by mouth daily.     cloNIDine (CATAPRES) 0.3 MG tablet Take 1 tablet (0.3 mg total) by mouth 2 (two) times daily. 60 tablet 11   glipiZIDE (GLUCOTROL XL) 5 MG 24 hr tablet Take 5 mg by mouth daily.     insulin detemir (LEVEMIR) 100 UNIT/ML injection Inject 0.1 mLs (10 Units total) into the skin daily after breakfast. (Patient taking differently: Inject 20 Units into the skin daily after breakfast.) 10 mL 11   lidocaine-prilocaine (EMLA) cream Apply 1 Application topically daily.     metolazone (ZAROXOLYN) 5 MG tablet Take 5 mg by mouth daily.     metoprolol tartrate (LOPRESSOR) 100 MG tablet Take 100 mg by mouth 2 (two) times daily.     midodrine (PROAMATINE) 5 MG tablet Take 1 tablet (5 mg total) by mouth every Monday, Wednesday, and Friday with hemodialysis. 90 tablet 1   mirtazapine (REMERON) 15 MG tablet Take 15 mg by mouth at bedtime.     multivitamin (RENA-VIT) TABS tablet Take 1 tablet by mouth at bedtime. 30 tablet 1   sertraline (ZOLOFT) 50 MG tablet Take 50 mg by mouth daily.     sevelamer carbonate (RENVELA) 800 MG tablet Take 800 mg by mouth 2 (two) times   daily with a meal.     ursodiol (ACTIGALL) 300 MG capsule Take by mouth.     amLODipine (NORVASC) 10 MG tablet Take 1 tablet (10 mg total) by mouth daily. (Patient not taking: Reported on 07/13/2022) 30 tablet 1   carvedilol (COREG) 12.5 MG tablet Take 1 tablet (12.5 mg total) by mouth 2 (two) times daily. (Patient not taking: Reported on 07/13/2022) 60 tablet 1   furosemide (LASIX) 40 MG tablet Take 1 tablet (40 mg total) by  mouth 2 (two) times daily. (Patient not taking: Reported on 07/13/2022) 30 tablet 0   LANTUS SOLOSTAR 100 UNIT/ML Solostar Pen Inject 18 Units into the skin at bedtime. (Patient not taking: Reported on 07/24/2022)     LEVEMIR FLEXPEN 100 UNIT/ML FlexPen Inject 18 Units into the skin daily. (Patient not taking: Reported on 07/24/2022)     losartan (COZAAR) 100 MG tablet Take 1 tablet (100 mg total) by mouth daily. (Patient not taking: Reported on 07/13/2022) 30 tablet 1   prednisoLONE acetate (PRED FORTE) 1 % ophthalmic suspension SMARTSIG:In Eye(s) (Patient not taking: Reported on 07/13/2022)     No current facility-administered medications on file prior to visit.    There are no Patient Instructions on file for this visit. No follow-ups on file.   Seara Hinesley E Maral Lampe, NP   

## 2022-08-27 NOTE — Telephone Encounter (Signed)
Spoke with the patient and he is scheduled with Dr. Gilda Crease on 08/28/22 with a 10:30 am arrival time to the the [facility] for a left arm fistulagram. Pre-procedure instructions were discussed and patient stated he understood. Patient does not have Mychart.

## 2022-08-28 DIAGNOSIS — N186 End stage renal disease: Secondary | ICD-10-CM

## 2022-08-28 NOTE — Telephone Encounter (Signed)
Patient called back to be rescheduled from 08/28/22 to 09/04/22 due to transportation. Patient has been rescheduled with a 12:00 pm arrival time to the [facility].

## 2022-09-04 ENCOUNTER — Ambulatory Visit
Admission: RE | Admit: 2022-09-04 | Discharge: 2022-09-04 | Disposition: A | Discharge status: Home / Self Care | Payer: BC Managed Care – PPO | Attending: Vascular Surgery | Admitting: Vascular Surgery

## 2022-09-04 ENCOUNTER — Encounter
Admission: RE | Disposition: A | Discharge status: Home / Self Care | Payer: Self-pay | Source: Home / Self Care | Attending: Vascular Surgery

## 2022-09-04 DIAGNOSIS — T82898A Other specified complication of vascular prosthetic devices, implants and grafts, initial encounter: Secondary | ICD-10-CM

## 2022-09-04 DIAGNOSIS — Z8249 Family history of ischemic heart disease and other diseases of the circulatory system: Secondary | ICD-10-CM | POA: Diagnosis not present

## 2022-09-04 DIAGNOSIS — Y832 Surgical operation with anastomosis, bypass or graft as the cause of abnormal reaction of the patient, or of later complication, without mention of misadventure at the time of the procedure: Secondary | ICD-10-CM | POA: Diagnosis not present

## 2022-09-04 DIAGNOSIS — N186 End stage renal disease: Secondary | ICD-10-CM | POA: Diagnosis not present

## 2022-09-04 DIAGNOSIS — E1122 Type 2 diabetes mellitus with diabetic chronic kidney disease: Secondary | ICD-10-CM | POA: Diagnosis not present

## 2022-09-04 DIAGNOSIS — T829XXA Unspecified complication of cardiac and vascular prosthetic device, implant and graft, initial encounter: Secondary | ICD-10-CM | POA: Insufficient documentation

## 2022-09-04 DIAGNOSIS — Z992 Dependence on renal dialysis: Secondary | ICD-10-CM | POA: Diagnosis not present

## 2022-09-04 DIAGNOSIS — I120 Hypertensive chronic kidney disease with stage 5 chronic kidney disease or end stage renal disease: Secondary | ICD-10-CM | POA: Insufficient documentation

## 2022-09-04 DIAGNOSIS — Z7984 Long term (current) use of oral hypoglycemic drugs: Secondary | ICD-10-CM | POA: Insufficient documentation

## 2022-09-04 DIAGNOSIS — Z794 Long term (current) use of insulin: Secondary | ICD-10-CM | POA: Diagnosis not present

## 2022-09-04 HISTORY — PX: A/V FISTULAGRAM: CATH118298

## 2022-09-04 LAB — POTASSIUM (ARMC VASCULAR LAB ONLY): Potassium (ARMC vascular lab): 4.1 mmol/L (ref 3.5–5.1)

## 2022-09-04 LAB — GLUCOSE, CAPILLARY
Glucose-Capillary: 52 mg/dL — ABNORMAL LOW (ref 70–99)
Glucose-Capillary: 85 mg/dL (ref 70–99)

## 2022-09-04 SURGERY — A/V FISTULAGRAM
Anesthesia: Moderate Sedation | Laterality: Left

## 2022-09-04 MED ORDER — DIPHENHYDRAMINE HCL 50 MG/ML IJ SOLN: 50.0000 mg | Freq: Once | INTRAMUSCULAR | Status: DC | PRN

## 2022-09-04 MED ORDER — MIDAZOLAM HCL 5 MG/5ML IJ SOLN
INTRAMUSCULAR | Status: AC
  Filled 2022-09-04: qty 5

## 2022-09-04 MED ORDER — MIDAZOLAM HCL 2 MG/2ML IJ SOLN
INTRAMUSCULAR | Status: DC | PRN
  Administered 2022-09-04 (×2): 1 mg via INTRAVENOUS

## 2022-09-04 MED ORDER — METHYLPREDNISOLONE SODIUM SUCC 125 MG IJ SOLR: 125.0000 mg | Freq: Once | INTRAMUSCULAR | Status: DC | PRN

## 2022-09-04 MED ORDER — CEFAZOLIN SODIUM-DEXTROSE 1-4 GM/50ML-% IV SOLN
INTRAVENOUS | Status: AC
  Filled 2022-09-04: qty 50

## 2022-09-04 MED ORDER — DEXTROSE 50 % IV SOLN
25.0000 g | Freq: Once | INTRAVENOUS | Status: AC
  Administered 2022-09-04: 25 g via INTRAVENOUS

## 2022-09-04 MED ORDER — FENTANYL CITRATE (PF) 100 MCG/2ML IJ SOLN
INTRAMUSCULAR | Status: DC | PRN
  Administered 2022-09-04: 25 ug via INTRAVENOUS
  Administered 2022-09-04: 50 ug via INTRAVENOUS

## 2022-09-04 MED ORDER — HEPARIN SODIUM (PORCINE) 1000 UNIT/ML IJ SOLN
INTRAMUSCULAR | Status: AC
  Filled 2022-09-04: qty 10

## 2022-09-04 MED ORDER — FAMOTIDINE 20 MG PO TABS: 40.0000 mg | ORAL_TABLET | Freq: Once | ORAL | Status: DC | PRN

## 2022-09-04 MED ORDER — MIDAZOLAM HCL 2 MG/ML PO SYRP: 8.0000 mg | ORAL_SOLUTION | Freq: Once | ORAL | Status: DC | PRN

## 2022-09-04 MED ORDER — ONDANSETRON HCL 4 MG/2ML IJ SOLN: 4.0000 mg | Freq: Four times a day (QID) | INTRAMUSCULAR | Status: DC | PRN

## 2022-09-04 MED ORDER — FENTANYL CITRATE (PF) 100 MCG/2ML IJ SOLN
INTRAMUSCULAR | Status: AC
  Filled 2022-09-04: qty 2

## 2022-09-04 MED ORDER — SODIUM CHLORIDE 0.9 % IV SOLN
INTRAVENOUS | Status: DC
  Administered 2022-09-04: 1000 mL via INTRAVENOUS

## 2022-09-04 MED ORDER — HYDROMORPHONE HCL 1 MG/ML IJ SOLN: 1.0000 mg | Freq: Once | INTRAMUSCULAR | Status: DC | PRN

## 2022-09-04 MED ORDER — CEFAZOLIN SODIUM-DEXTROSE 1-4 GM/50ML-% IV SOLN
1.0000 g | INTRAVENOUS | Status: AC
  Administered 2022-09-04: 1 g via INTRAVENOUS

## 2022-09-04 MED ORDER — DEXTROSE 50 % IV SOLN
INTRAVENOUS | Status: AC
  Filled 2022-09-04: qty 50

## 2022-09-04 SURGICAL SUPPLY — 10 items
COVER PROBE ULTRASOUND 5X96 (MISCELLANEOUS) IMPLANT
DRAPE BRACHIAL (DRAPES) IMPLANT
GOWN STRL REUS W/ TWL LRG LVL3 (GOWN DISPOSABLE) ×1 IMPLANT
GOWN STRL REUS W/TWL LRG LVL3 (GOWN DISPOSABLE) ×1
NDL ENTRY 21GA 7CM ECHOTIP (NEEDLE) IMPLANT
NEEDLE ENTRY 21GA 7CM ECHOTIP (NEEDLE) ×1 IMPLANT
PACK ANGIOGRAPHY (CUSTOM PROCEDURE TRAY) ×1 IMPLANT
SET INTRO CAPELLA COAXIAL (SET/KITS/TRAYS/PACK) IMPLANT
SHEATH BRITE TIP 6FRX5.5 (SHEATH) IMPLANT
SUT MNCRL AB 4-0 PS2 18 (SUTURE) IMPLANT

## 2022-09-04 NOTE — Interval H&P Note (Signed)
History and Physical Interval Note:  09/04/2022 1:16 PM  Bradley Lopez  has presented today for surgery, with the diagnosis of L arm fistulagram   End Stage Renal.  The various methods of treatment have been discussed with the patient and family. After consideration of risks, benefits and other options for treatment, the patient has consented to  Procedure(s): A/V Fistulagram (Left) as a surgical intervention.  The patient's history has been reviewed, patient examined, no change in status, stable for surgery.  I have reviewed the patient's chart and labs.  Questions were answered to the patient's satisfaction.     Levora Dredge

## 2022-09-04 NOTE — Op Note (Signed)
OPERATIVE NOTE   PROCEDURE: Contrast injection left arm brachiocephalic fistula  PRE-OPERATIVE DIAGNOSIS: Complication of dialysis access                                                       End Stage Renal Disease  POST-OPERATIVE DIAGNOSIS: same as above   SURGEON: Renford Dills, M.D.  ANESTHESIA: Conscious sedation was administered under my direct supervision by the interventional radiology RN.  IV Versed plus fentanyl were utilized. Continuous ECG, pulse oximetry and blood pressure was monitored throughout the entire procedure.  Conscious sedation was for a total of 24 minutes 56 seconds.  ESTIMATED BLOOD LOSS: minimal  FINDING(S): No strictures or abnormalities noted  SPECIMEN(S):  None  CONTRAST: 15 cc  FLUOROSCOPY TIME: 0.5 minutes  INDICATIONS: Bradley Lopez is a 47 y.o. male who  presents with malfunctioning left arm brachiocephalic AV access.  The patient is scheduled for angiography with possible intervention of the AV access to prevent loss of the permanent access.  The patient is aware the risks include but are not limited to: bleeding, infection, thrombosis of the cannulated access, and possible anaphylactic reaction to the contrast.  The patient acknowledges if the access can not be salvaged a tunneled catheter will be needed and will be placed during this procedure.  The patient is aware of the risks of the procedure and elects to proceed with the angiogram and intervention.  DESCRIPTION: After full informed written consent was obtained, the patient was brought back to the Special Procedure suite and placed supine position.  Appropriate cardiopulmonary monitors were placed.  The left arm was prepped and draped in the standard fashion.  Appropriate timeout is called.   The left brachiocephalic fistula was cannulated with a micropuncture needle under ultrasound guidence.  Ultrasound was used to evaluate the left arm access.  It was echolucent and compressible  indicating it is patent .  An ultrasound image was acquired for the permanent record.  A micropuncture needle was used to access the left arm access under direct ultrasound guidance.  The microwire was then advanced under fluoroscopic guidance without difficulty followed by the micro-sheath.  The J-wire was then advanced and a 6 Fr sheath inserted.  Hand injections were completed to image the access from the arterial anastomosis through the entire access.  The central venous structures were also imaged by hand injections.  Based on the images, no intervention is necessary.  There are no strictures or other abnormalities identified in the peripheral or central segments.  A 4-0 Monocryl purse-string suture was sewn around the sheath.  The sheath was removed and light pressure was applied.  A sterile bandage was applied to the puncture site.    COMPLICATIONS: None  CONDITION: [name], M.D [facility] Office: [phone]  09/04/2022 5:38 PM

## 2022-09-05 ENCOUNTER — Encounter: Payer: Self-pay | Admitting: Vascular Surgery

## 2022-09-25 ENCOUNTER — Other Ambulatory Visit (INDEPENDENT_AMBULATORY_CARE_PROVIDER_SITE_OTHER): Payer: Self-pay | Admitting: Vascular Surgery

## 2022-09-25 DIAGNOSIS — N186 End stage renal disease: Secondary | ICD-10-CM

## 2022-09-27 ENCOUNTER — Ambulatory Visit (INDEPENDENT_AMBULATORY_CARE_PROVIDER_SITE_OTHER): Payer: BC Managed Care – PPO | Admitting: Nurse Practitioner

## 2022-09-27 ENCOUNTER — Encounter (INDEPENDENT_AMBULATORY_CARE_PROVIDER_SITE_OTHER): Payer: BC Managed Care – PPO

## 2022-10-19 ENCOUNTER — Ambulatory Visit (INDEPENDENT_AMBULATORY_CARE_PROVIDER_SITE_OTHER): Payer: BC Managed Care – PPO

## 2022-10-19 ENCOUNTER — Ambulatory Visit (INDEPENDENT_AMBULATORY_CARE_PROVIDER_SITE_OTHER): Payer: BC Managed Care – PPO | Admitting: Nurse Practitioner

## 2022-10-19 ENCOUNTER — Encounter (INDEPENDENT_AMBULATORY_CARE_PROVIDER_SITE_OTHER): Payer: Self-pay | Admitting: Nurse Practitioner

## 2022-10-19 ENCOUNTER — Telehealth (INDEPENDENT_AMBULATORY_CARE_PROVIDER_SITE_OTHER): Payer: Self-pay

## 2022-10-19 VITALS — BP 149/84 | HR 60 | Resp 18 | Ht 75.0 in | Wt 200.3 lb

## 2022-10-19 DIAGNOSIS — I10 Essential (primary) hypertension: Secondary | ICD-10-CM | POA: Diagnosis not present

## 2022-10-19 DIAGNOSIS — N186 End stage renal disease: Secondary | ICD-10-CM

## 2022-10-19 DIAGNOSIS — E1122 Type 2 diabetes mellitus with diabetic chronic kidney disease: Secondary | ICD-10-CM | POA: Diagnosis not present

## 2022-10-19 NOTE — Telephone Encounter (Signed)
Spoke with the patient and he is scheduled with Dr. Gilda Crease on 10/23/22 with a 11:30 am arrival time to the [facility] for a LUE fistulagram. Pre-procedure instructions were discussed and will be mailed.

## 2022-10-20 NOTE — H&P (View-Only) (Signed)
Subjective:    Patient ID: Bradley Lopez, male    DOB: 16-Apr-1976, 47 y.o.   MRN: [id number] Chief Complaint  Patient presents with   Follow-up    follow up after procedure with HDA    The patient returns to the office for followup status post intervention of the dialysis access in his left upper extremity, his brachiocephalic AV fistula.   Following the intervention the excess function has worsened per the patient.  The patient continues to be experiencing increased bleeding times following decannulation and increased recirculation with diminished efficiency of their dialysis.  He notes that 1 day following dialysis he continued to bleed for 5 hours post.  The patient denies an increase in arm swelling. At the present time the patient denies hand pain.  No recent shortening of the patient's walking distance or new symptoms consistent with claudication.  No history of rest pain symptoms. No new ulcers or wounds of the lower extremities have occurred.  The patient denies amaurosis fugax or recent TIA symptoms. There are no recent neurological changes noted. There is no history of DVT, PE or superficial thrombophlebitis. No recent episodes of angina or shortness of breath documented.   Duplex ultrasound of the AV access shows a patent access.   Flow volume today is 1084 cc/min (previous flow volume was 1062 cc/min).  Today it is noted that the patient has a hemodynamically significant velocity increase at the antecubital fossa outlet flow level that appears to be due to a decreased vessel diameter.    Review of Systems  Hematological:  Bruises/bleeds easily.  All other systems reviewed and are negative.      Objective:   Physical Exam Vitals reviewed.  HENT:     Head: Normocephalic.  Cardiovascular:     Rate and Rhythm: Normal rate.     Pulses:          Radial pulses are 2+ on the left side.  Pulmonary:     Effort: Pulmonary effort is normal.  Skin:    General: Skin is warm  and dry.  Neurological:     Mental Status: He is alert and oriented to person, place, and time.  Psychiatric:        Mood and Affect: Mood normal.        Behavior: Behavior normal.        Thought Content: Thought content normal.        Judgment: Judgment normal.     BP (!) 149/84 (BP Location: Right Arm)   Pulse 60   Resp 18   Ht 6\' 3"  (1.905 m)   Wt 200 lb 4.8 oz (90.9 kg)   BMI 25.04 kg/m   Past Medical History:  Diagnosis Date   Allergy    Anemia    Asthma    Chronic kidney disease    Depression    situational   Diabetes mellitus without complication (HCC)    Hypertension    Pneumonia     Social History   Socioeconomic History   Marital status: Married    Spouse name: Elnita Maxwell   Number of children: Not on file   Years of education: Not on file   Highest education level: Not on file  Occupational History   Not on file  Tobacco Use   Smoking status: Never    Passive exposure: Never   Smokeless tobacco: Never  Vaping Use   Vaping Use: Never used  Substance and Sexual Activity   Alcohol use: Never  Drug use: Never   Sexual activity: Yes  Other Topics Concern   Not on file  Social History Narrative   Not on file   Social Determinants of Health   Financial Resource Strain: Not on file  Food Insecurity: No Food Insecurity (02/20/2022)   Hunger Vital Sign    Worried About Running Out of Food in the Last Year: Never true    Ran Out of Food in the Last Year: Never true  Transportation Needs: No Transportation Needs (02/20/2022)   PRAPARE - Administrator, Civil Service (Medical): No    Lack of Transportation (Non-Medical): No  Physical Activity: Not on file  Stress: Not on file  Social Connections: Not on file  Intimate Partner Violence: Not At Risk (02/20/2022)   Humiliation, Afraid, Rape, and Kick questionnaire    Fear of Current or Ex-Partner: No    Emotionally Abused: No    Physically Abused: No    Sexually Abused: No    Past  Surgical History:  Procedure Laterality Date   A/V FISTULAGRAM N/A 02/23/2022   Procedure: A/V Fistulagram;  Surgeon: Renford Dills, MD;  Location: [facility];  Service: Cardiovascular;  Laterality: N/A;   A/V FISTULAGRAM Left 07/24/2022   Procedure: A/V Fistulagram;  Surgeon: Renford Dills, MD;  Location: [facility];  Service: Cardiovascular;  Laterality: Left;   A/V FISTULAGRAM Left 09/04/2022   Procedure: A/V Fistulagram;  Surgeon: Renford Dills, MD;  Location: [facility];  Service: Cardiovascular;  Laterality: Left;   AV FISTULA PLACEMENT Left 01/03/2022   Procedure: ARTERIOVENOUS (AV) FISTULA CREATION (BRACHIALCEPHALIC);  Surgeon: Renford Dills, MD;  Location: [facility];  Service: Vascular;  Laterality: Left;   COLONOSCOPY N/A 09/22/2021   Procedure: COLONOSCOPY;  Surgeon: Jaynie Collins, DO;  Location: [facility];  Service: Gastroenterology;  Laterality: N/A;   DIALYSIS/PERMA CATHETER INSERTION N/A 02/23/2022   Procedure: DIALYSIS/PERMA CATHETER INSERTION;  Surgeon: Renford Dills, MD;  Location: [facility];  Service: Cardiovascular;  Laterality: N/A;   DIALYSIS/PERMA CATHETER INSERTION N/A 07/13/2022   Procedure: DIALYSIS/PERMA CATHETER INSERTION;  Surgeon: Annice Needy, MD;  Location: [facility];  Service: Cardiovascular;  Laterality: N/A;   DIALYSIS/PERMA CATHETER REMOVAL N/A 05/21/2022   Procedure: DIALYSIS/PERMA CATHETER REMOVAL;  Surgeon: Annice Needy, MD;  Location: [facility];  Service: Cardiovascular;  Laterality: N/A;   EYE SURGERY Bilateral    detatch retina   TEMPORARY DIALYSIS CATHETER Right 02/19/2022   Procedure: TEMPORARY DIALYSIS CATHETER;  Surgeon: Annice Needy, MD;  Location: [facility];  Service: Cardiovascular;  Laterality: Right;    Family History  Problem Relation Age of Onset   Coronary artery disease Mother    Hypertension Father    Prostate cancer Father      Allergies  Allergen Reactions   Pine Hives and Itching       Latest Ref Rng & Units 07/14/2022   12:57 PM 02/24/2022    4:59 AM 02/21/2022    4:26 AM  CBC  WBC 4.0 - 10.5 K/uL 9.9  10.2  11.0   Hemoglobin 13.0 - 17.0 g/dL 16.1  9.0  9.0   Hematocrit 39.0 - 52.0 % 32.6  27.1  27.9   Platelets 150 - 400 K/uL 267  236  292       CMP     Component Value Date/Time   NA 138 07/14/2022 1257   K 3.6  07/14/2022 1257   CL 99 07/14/2022 1257   CO2 29 07/14/2022 1257   GLUCOSE 108 (H) 07/14/2022 1257   BUN 41 (H) 07/14/2022 1257   CREATININE 4.93 (H) 07/14/2022 1257   CALCIUM 8.4 (L) 07/14/2022 1257   PROT 7.9 07/14/2022 1257   ALBUMIN 4.0 07/14/2022 1257   AST 17 07/14/2022 1257   ALT 12 07/14/2022 1257   ALKPHOS 69 07/14/2022 1257   BILITOT 0.6 07/14/2022 1257   GFRNONAA 14 (L) 07/14/2022 1257     No results found.     Assessment & Plan:   1. ESRD (end stage renal disease) (HCC) Recommend:  The patient is experiencing increasing problems with their dialysis access.  Patient should have a fistulagram with the intention for intervention.  The intention for intervention is to restore appropriate flow and prevent thrombosis and possible loss of the access.  As well as improve the quality of dialysis therapy.  The risks, benefits and alternative therapies were reviewed in detail with the patient.  All questions were answered.  The patient agrees to proceed with angio/intervention.    The patient will follow up with me in the office after the procedure.   2. Essential hypertension Continue antihypertensive medications as already ordered, these medications have been reviewed and there are no changes at this time.  3. 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  Hgb A1C to be monitored as already arranged by primary  service   Current Outpatient Medications on File Prior to Visit  Medication Sig Dispense Refill   albuterol (VENTOLIN HFA) 108 (90 Base) MCG/ACT inhaler Inhale 2 puffs into the lungs every 6 (six) hours as needed.     aspirin EC 81 MG EC tablet Take 1 tablet (81 mg total) by mouth daily. Swallow whole. 30 tablet 11   atorvastatin (LIPITOR) 40 MG tablet Take 40 mg by mouth daily.     atorvastatin (LIPITOR) 80 MG tablet Take 1 tablet (80 mg total) by mouth daily. 30 tablet 1   B Complex-C-Folic Acid (RENA-VITE RX) 1 MG TABS Take 1 tablet by mouth daily.     calcitRIOL (ROCALTROL) 0.5 MCG capsule Take 0.5 mcg by mouth daily.     cloNIDine (CATAPRES) 0.3 MG tablet Take 1 tablet (0.3 mg total) by mouth 2 (two) times daily. 60 tablet 11   glipiZIDE (GLUCOTROL XL) 5 MG 24 hr tablet Take 5 mg by mouth daily.     insulin detemir (LEVEMIR) 100 UNIT/ML injection Inject 0.1 mLs (10 Units total) into the skin daily after breakfast. (Patient taking differently: Inject 20 Units into the skin daily after breakfast.) 10 mL 11   LEVEMIR FLEXPEN 100 UNIT/ML FlexPen Inject 18 Units into the skin daily.     lidocaine-prilocaine (EMLA) cream Apply 1 Application topically daily.     metolazone (ZAROXOLYN) 5 MG tablet Take 5 mg by mouth daily.     metoprolol tartrate (LOPRESSOR) 100 MG tablet Take 100 mg by mouth 2 (two) times daily.     midodrine (PROAMATINE) 5 MG tablet Take 1 tablet (5 mg total) by mouth every Monday, Wednesday, and Friday with hemodialysis. 90 tablet 1   mirtazapine (REMERON) 15 MG tablet Take 15 mg by mouth at bedtime.     multivitamin (RENA-VIT) TABS tablet Take 1 tablet by mouth at bedtime. 30 tablet 1   sertraline (ZOLOFT) 50 MG  tablet Take 50 mg by mouth daily.     sevelamer carbonate (RENVELA) 800 MG tablet Take 800 mg by mouth 2 (two) times daily with a meal.     ursodiol (ACTIGALL) 300 MG capsule Take by mouth.     amLODipine (NORVASC) 10 MG tablet Take 1 tablet (10 mg total) by mouth  daily. (Patient not taking: Reported on 07/13/2022) 30 tablet 1   carvedilol (COREG) 12.5 MG tablet Take 1 tablet (12.5 mg total) by mouth 2 (two) times daily. (Patient not taking: Reported on 07/13/2022) 60 tablet 1   furosemide (LASIX) 40 MG tablet Take 1 tablet (40 mg total) by mouth 2 (two) times daily. (Patient not taking: Reported on 07/13/2022) 30 tablet 0   LANTUS SOLOSTAR 100 UNIT/ML Solostar Pen Inject 18 Units into the skin at bedtime. (Patient not taking: Reported on 07/24/2022)     losartan (COZAAR) 100 MG tablet Take 1 tablet (100 mg total) by mouth daily. (Patient not taking: Reported on 07/13/2022) 30 tablet 1   prednisoLONE acetate (PRED FORTE) 1 % ophthalmic suspension SMARTSIG:In Eye(s) (Patient not taking: Reported on 07/13/2022)     No current facility-administered medications on file prior to visit.    There are no Patient Instructions on file for this visit. No follow-ups on file.   Georgiana Spinner, NP

## 2022-10-20 NOTE — Progress Notes (Signed)
 Subjective:    Patient ID: Bradley Lopez, male    DOB: 04/17/1976, 47 y.o.   MRN: [id number] Chief Complaint  Patient presents with   Follow-up    follow up after procedure with HDA    The patient returns to the office for followup status post intervention of the dialysis access in his left upper extremity, his brachiocephalic AV fistula.   Following the intervention the excess function has worsened per the patient.  The patient continues to be experiencing increased bleeding times following decannulation and increased recirculation with diminished efficiency of their dialysis.  He notes that 1 day following dialysis he continued to bleed for 5 hours post.  The patient denies an increase in arm swelling. At the present time the patient denies hand pain.  No recent shortening of the patient's walking distance or new symptoms consistent with claudication.  No history of rest pain symptoms. No new ulcers or wounds of the lower extremities have occurred.  The patient denies amaurosis fugax or recent TIA symptoms. There are no recent neurological changes noted. There is no history of DVT, PE or superficial thrombophlebitis. No recent episodes of angina or shortness of breath documented.   Duplex ultrasound of the AV access shows a patent access.   Flow volume today is 1084 cc/min (previous flow volume was 1062 cc/min).  Today it is noted that the patient has a hemodynamically significant velocity increase at the antecubital fossa outlet flow level that appears to be due to a decreased vessel diameter.    Review of Systems  Hematological:  Bruises/bleeds easily.  All other systems reviewed and are negative.      Objective:   Physical Exam Vitals reviewed.  HENT:     Head: Normocephalic.  Cardiovascular:     Rate and Rhythm: Normal rate.     Pulses:          Radial pulses are 2+ on the left side.  Pulmonary:     Effort: Pulmonary effort is normal.  Skin:    General: Skin is warm  and dry.  Neurological:     Mental Status: He is alert and oriented to person, place, and time.  Psychiatric:        Mood and Affect: Mood normal.        Behavior: Behavior normal.        Thought Content: Thought content normal.        Judgment: Judgment normal.     BP (!) 149/84 (BP Location: Right Arm)   Pulse 60   Resp 18   Ht 6' 3" (1.905 m)   Wt 200 lb 4.8 oz (90.9 kg)   BMI 25.04 kg/m   Past Medical History:  Diagnosis Date   Allergy    Anemia    Asthma    Chronic kidney disease    Depression    situational   Diabetes mellitus without complication (HCC)    Hypertension    Pneumonia     Social History   Socioeconomic History   Marital status: Married    Spouse name: Cheryl   Number of children: Not on file   Years of education: Not on file   Highest education level: Not on file  Occupational History   Not on file  Tobacco Use   Smoking status: Never    Passive exposure: Never   Smokeless tobacco: Never  Vaping Use   Vaping Use: Never used  Substance and Sexual Activity   Alcohol use: Never     Drug use: Never   Sexual activity: Yes  Other Topics Concern   Not on file  Social History Narrative   Not on file   Social Determinants of Health   Financial Resource Strain: Not on file  Food Insecurity: No Food Insecurity (02/20/2022)   Hunger Vital Sign    Worried About Running Out of Food in the Last Year: Never true    Ran Out of Food in the Last Year: Never true  Transportation Needs: No Transportation Needs (02/20/2022)   PRAPARE - Transportation    Lack of Transportation (Medical): No    Lack of Transportation (Non-Medical): No  Physical Activity: Not on file  Stress: Not on file  Social Connections: Not on file  Intimate Partner Violence: Not At Risk (02/20/2022)   Humiliation, Afraid, Rape, and Kick questionnaire    Fear of Current or Ex-Partner: No    Emotionally Abused: No    Physically Abused: No    Sexually Abused: No    Past  Surgical History:  Procedure Laterality Date   A/V FISTULAGRAM N/A 02/23/2022   Procedure: A/V Fistulagram;  Surgeon: Schnier, Gregory G, MD;  Location: [facility];  Service: Cardiovascular;  Laterality: N/A;   A/V FISTULAGRAM Left 07/24/2022   Procedure: A/V Fistulagram;  Surgeon: Schnier, Gregory G, MD;  Location: [facility];  Service: Cardiovascular;  Laterality: Left;   A/V FISTULAGRAM Left 09/04/2022   Procedure: A/V Fistulagram;  Surgeon: Schnier, Gregory G, MD;  Location: [facility];  Service: Cardiovascular;  Laterality: Left;   AV FISTULA PLACEMENT Left 01/03/2022   Procedure: ARTERIOVENOUS (AV) FISTULA CREATION (BRACHIALCEPHALIC);  Surgeon: Schnier, Gregory G, MD;  Location: [facility];  Service: Vascular;  Laterality: Left;   COLONOSCOPY N/A 09/22/2021   Procedure: COLONOSCOPY;  Surgeon: Russo, Steven Michael, DO;  Location: [facility];  Service: Gastroenterology;  Laterality: N/A;   DIALYSIS/PERMA CATHETER INSERTION N/A 02/23/2022   Procedure: DIALYSIS/PERMA CATHETER INSERTION;  Surgeon: Schnier, Gregory G, MD;  Location: [facility];  Service: Cardiovascular;  Laterality: N/A;   DIALYSIS/PERMA CATHETER INSERTION N/A 07/13/2022   Procedure: DIALYSIS/PERMA CATHETER INSERTION;  Surgeon: Dew, Jason S, MD;  Location: [facility];  Service: Cardiovascular;  Laterality: N/A;   DIALYSIS/PERMA CATHETER REMOVAL N/A 05/21/2022   Procedure: DIALYSIS/PERMA CATHETER REMOVAL;  Surgeon: Dew, Jason S, MD;  Location: [facility];  Service: Cardiovascular;  Laterality: N/A;   EYE SURGERY Bilateral    detatch retina   TEMPORARY DIALYSIS CATHETER Right 02/19/2022   Procedure: TEMPORARY DIALYSIS CATHETER;  Surgeon: Dew, Jason S, MD;  Location: [facility];  Service: Cardiovascular;  Laterality: Right;    Family History  Problem Relation Age of Onset   Coronary artery disease Mother    Hypertension Father    Prostate cancer Father      Allergies  Allergen Reactions   Pine Hives and Itching       Latest Ref Rng & Units 07/14/2022   12:57 PM 02/24/2022    4:59 AM 02/21/2022    4:26 AM  CBC  WBC 4.0 - 10.5 K/uL 9.9  10.2  11.0   Hemoglobin 13.0 - 17.0 g/dL 10.5  9.0  9.0   Hematocrit 39.0 - 52.0 % 32.6  27.1  27.9   Platelets 150 - 400 K/uL 267  236  292       CMP     Component Value Date/Time   NA 138 07/14/2022 1257   K 3.6   07/14/2022 1257   CL 99 07/14/2022 1257   CO2 29 07/14/2022 1257   GLUCOSE 108 (H) 07/14/2022 1257   BUN 41 (H) 07/14/2022 1257   CREATININE 4.93 (H) 07/14/2022 1257   CALCIUM 8.4 (L) 07/14/2022 1257   PROT 7.9 07/14/2022 1257   ALBUMIN 4.0 07/14/2022 1257   AST 17 07/14/2022 1257   ALT 12 07/14/2022 1257   ALKPHOS 69 07/14/2022 1257   BILITOT 0.6 07/14/2022 1257   GFRNONAA 14 (L) 07/14/2022 1257     No results found.     Assessment & Plan:   1. ESRD (end stage renal disease) (HCC) Recommend:  The patient is experiencing increasing problems with their dialysis access.  Patient should have a fistulagram with the intention for intervention.  The intention for intervention is to restore appropriate flow and prevent thrombosis and possible loss of the access.  As well as improve the quality of dialysis therapy.  The risks, benefits and alternative therapies were reviewed in detail with the patient.  All questions were answered.  The patient agrees to proceed with angio/intervention.    The patient will follow up with me in the office after the procedure.   2. Essential hypertension Continue antihypertensive medications as already ordered, these medications have been reviewed and there are no changes at this time.  3. 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  Hgb A1C to be monitored as already arranged by primary  service   Current Outpatient Medications on File Prior to Visit  Medication Sig Dispense Refill   albuterol (VENTOLIN HFA) 108 (90 Base) MCG/ACT inhaler Inhale 2 puffs into the lungs every 6 (six) hours as needed.     aspirin EC 81 MG EC tablet Take 1 tablet (81 mg total) by mouth daily. Swallow whole. 30 tablet 11   atorvastatin (LIPITOR) 40 MG tablet Take 40 mg by mouth daily.     atorvastatin (LIPITOR) 80 MG tablet Take 1 tablet (80 mg total) by mouth daily. 30 tablet 1   B Complex-C-Folic Acid (RENA-VITE RX) 1 MG TABS Take 1 tablet by mouth daily.     calcitRIOL (ROCALTROL) 0.5 MCG capsule Take 0.5 mcg by mouth daily.     cloNIDine (CATAPRES) 0.3 MG tablet Take 1 tablet (0.3 mg total) by mouth 2 (two) times daily. 60 tablet 11   glipiZIDE (GLUCOTROL XL) 5 MG 24 hr tablet Take 5 mg by mouth daily.     insulin detemir (LEVEMIR) 100 UNIT/ML injection Inject 0.1 mLs (10 Units total) into the skin daily after breakfast. (Patient taking differently: Inject 20 Units into the skin daily after breakfast.) 10 mL 11   LEVEMIR FLEXPEN 100 UNIT/ML FlexPen Inject 18 Units into the skin daily.     lidocaine-prilocaine (EMLA) cream Apply 1 Application topically daily.     metolazone (ZAROXOLYN) 5 MG tablet Take 5 mg by mouth daily.     metoprolol tartrate (LOPRESSOR) 100 MG tablet Take 100 mg by mouth 2 (two) times daily.     midodrine (PROAMATINE) 5 MG tablet Take 1 tablet (5 mg total) by mouth every Monday, Wednesday, and Friday with hemodialysis. 90 tablet 1   mirtazapine (REMERON) 15 MG tablet Take 15 mg by mouth at bedtime.     multivitamin (RENA-VIT) TABS tablet Take 1 tablet by mouth at bedtime. 30 tablet 1   sertraline (ZOLOFT) 50 MG   tablet Take 50 mg by mouth daily.     sevelamer carbonate (RENVELA) 800 MG tablet Take 800 mg by mouth 2 (two) times daily with a meal.     ursodiol (ACTIGALL) 300 MG capsule Take by mouth.     amLODipine (NORVASC) 10 MG tablet Take 1 tablet (10 mg total) by mouth  daily. (Patient not taking: Reported on 07/13/2022) 30 tablet 1   carvedilol (COREG) 12.5 MG tablet Take 1 tablet (12.5 mg total) by mouth 2 (two) times daily. (Patient not taking: Reported on 07/13/2022) 60 tablet 1   furosemide (LASIX) 40 MG tablet Take 1 tablet (40 mg total) by mouth 2 (two) times daily. (Patient not taking: Reported on 07/13/2022) 30 tablet 0   LANTUS SOLOSTAR 100 UNIT/ML Solostar Pen Inject 18 Units into the skin at bedtime. (Patient not taking: Reported on 07/24/2022)     losartan (COZAAR) 100 MG tablet Take 1 tablet (100 mg total) by mouth daily. (Patient not taking: Reported on 07/13/2022) 30 tablet 1   prednisoLONE acetate (PRED FORTE) 1 % ophthalmic suspension SMARTSIG:In Eye(s) (Patient not taking: Reported on 07/13/2022)     No current facility-administered medications on file prior to visit.    There are no Patient Instructions on file for this visit. No follow-ups on file.   Lasalle Abee E Perline Awe, NP   

## 2022-10-23 ENCOUNTER — Encounter: Payer: Self-pay | Admitting: Vascular Surgery

## 2022-10-23 ENCOUNTER — Encounter
Admission: RE | Disposition: A | Discharge status: Home / Self Care | Payer: Self-pay | Source: Home / Self Care | Attending: Vascular Surgery

## 2022-10-23 ENCOUNTER — Ambulatory Visit
Admission: RE | Admit: 2022-10-23 | Discharge: 2022-10-23 | Disposition: A | Discharge status: Home / Self Care | Payer: BC Managed Care – PPO | Attending: Vascular Surgery | Admitting: Vascular Surgery

## 2022-10-23 ENCOUNTER — Other Ambulatory Visit: Payer: Self-pay

## 2022-10-23 DIAGNOSIS — N186 End stage renal disease: Secondary | ICD-10-CM | POA: Insufficient documentation

## 2022-10-23 DIAGNOSIS — I120 Hypertensive chronic kidney disease with stage 5 chronic kidney disease or end stage renal disease: Secondary | ICD-10-CM | POA: Diagnosis not present

## 2022-10-23 DIAGNOSIS — E1122 Type 2 diabetes mellitus with diabetic chronic kidney disease: Secondary | ICD-10-CM | POA: Insufficient documentation

## 2022-10-23 DIAGNOSIS — Y841 Kidney dialysis as the cause of abnormal reaction of the patient, or of later complication, without mention of misadventure at the time of the procedure: Secondary | ICD-10-CM | POA: Insufficient documentation

## 2022-10-23 DIAGNOSIS — T82858A Stenosis of vascular prosthetic devices, implants and grafts, initial encounter: Secondary | ICD-10-CM | POA: Diagnosis not present

## 2022-10-23 DIAGNOSIS — T82898A Other specified complication of vascular prosthetic devices, implants and grafts, initial encounter: Secondary | ICD-10-CM | POA: Diagnosis present

## 2022-10-23 DIAGNOSIS — Z992 Dependence on renal dialysis: Secondary | ICD-10-CM | POA: Diagnosis not present

## 2022-10-23 DIAGNOSIS — Z794 Long term (current) use of insulin: Secondary | ICD-10-CM | POA: Diagnosis not present

## 2022-10-23 DIAGNOSIS — Z7984 Long term (current) use of oral hypoglycemic drugs: Secondary | ICD-10-CM | POA: Insufficient documentation

## 2022-10-23 HISTORY — PX: A/V FISTULAGRAM: CATH118298

## 2022-10-23 LAB — GLUCOSE, CAPILLARY
Glucose-Capillary: 104 mg/dL — ABNORMAL HIGH (ref 70–99)
Glucose-Capillary: 82 mg/dL (ref 70–99)

## 2022-10-23 LAB — POTASSIUM (ARMC VASCULAR LAB ONLY): Potassium (ARMC vascular lab): 4.4 mmol/L (ref 3.5–5.1)

## 2022-10-23 SURGERY — A/V FISTULAGRAM
Anesthesia: Moderate Sedation | Laterality: Left

## 2022-10-23 MED ORDER — SODIUM CHLORIDE 0.9 % IV SOLN: INTRAVENOUS | Status: DC

## 2022-10-23 MED ORDER — FENTANYL CITRATE PF 50 MCG/ML IJ SOSY
PREFILLED_SYRINGE | INTRAMUSCULAR | Status: AC
  Filled 2022-10-23: qty 1

## 2022-10-23 MED ORDER — HEPARIN (PORCINE) IN NACL 1000-0.9 UT/500ML-% IV SOLN
INTRAVENOUS | Status: DC | PRN
  Administered 2022-10-23: 1000 mL

## 2022-10-23 MED ORDER — MIDAZOLAM HCL 2 MG/ML PO SYRP: 8.0000 mg | ORAL_SOLUTION | Freq: Once | ORAL | Status: DC | PRN

## 2022-10-23 MED ORDER — METHYLPREDNISOLONE SODIUM SUCC 125 MG IJ SOLR: 125.0000 mg | Freq: Once | INTRAMUSCULAR | Status: DC | PRN

## 2022-10-23 MED ORDER — CEFAZOLIN SODIUM-DEXTROSE 1-4 GM/50ML-% IV SOLN: 1.0000 g | INTRAVENOUS | Status: DC

## 2022-10-23 MED ORDER — CEFAZOLIN SODIUM-DEXTROSE 1-4 GM/50ML-% IV SOLN
INTRAVENOUS | Status: DC | PRN
  Administered 2022-10-23: 1 g via INTRAVENOUS

## 2022-10-23 MED ORDER — MIDAZOLAM HCL 2 MG/2ML IJ SOLN
INTRAMUSCULAR | Status: DC | PRN
  Administered 2022-10-23: 2 mg via INTRAVENOUS
  Administered 2022-10-23 (×2): 1 mg via INTRAVENOUS

## 2022-10-23 MED ORDER — MIDAZOLAM HCL 2 MG/2ML IJ SOLN
INTRAMUSCULAR | Status: AC
  Filled 2022-10-23: qty 2

## 2022-10-23 MED ORDER — HYDROMORPHONE HCL 1 MG/ML IJ SOLN: 1.0000 mg | Freq: Once | INTRAMUSCULAR | Status: DC | PRN

## 2022-10-23 MED ORDER — IODIXANOL 320 MG/ML IV SOLN
INTRAVENOUS | Status: DC | PRN
  Administered 2022-10-23: 40 mL via INTRAVENOUS

## 2022-10-23 MED ORDER — ONDANSETRON HCL 4 MG/2ML IJ SOLN: 4.0000 mg | Freq: Four times a day (QID) | INTRAMUSCULAR | Status: DC | PRN

## 2022-10-23 MED ORDER — HEPARIN SODIUM (PORCINE) 1000 UNIT/ML IJ SOLN
INTRAMUSCULAR | Status: DC | PRN
  Administered 2022-10-23: 4000 [IU] via INTRAVENOUS

## 2022-10-23 MED ORDER — FAMOTIDINE 20 MG PO TABS: 40.0000 mg | ORAL_TABLET | Freq: Once | ORAL | Status: DC | PRN

## 2022-10-23 MED ORDER — CEFAZOLIN SODIUM-DEXTROSE 1-4 GM/50ML-% IV SOLN
INTRAVENOUS | Status: AC
  Filled 2022-10-23: qty 50

## 2022-10-23 MED ORDER — DIPHENHYDRAMINE HCL 50 MG/ML IJ SOLN: 50.0000 mg | Freq: Once | INTRAMUSCULAR | Status: DC | PRN

## 2022-10-23 MED ORDER — FENTANYL CITRATE (PF) 100 MCG/2ML IJ SOLN
INTRAMUSCULAR | Status: DC | PRN
  Administered 2022-10-23 (×2): 50 ug via INTRAVENOUS
  Administered 2022-10-23: 25 ug via INTRAVENOUS

## 2022-10-23 MED ORDER — HEPARIN SODIUM (PORCINE) 1000 UNIT/ML IJ SOLN
INTRAMUSCULAR | Status: AC
  Filled 2022-10-23: qty 10

## 2022-10-23 SURGICAL SUPPLY — 28 items
BALLN LUTONIX AV 8X40X75 (BALLOONS) ×1
BALLN LUTONIX DCB 5X60X130 (BALLOONS) ×1
BALLOON LUTONIX AV 8X40X75 (BALLOONS) IMPLANT
BALLOON LUTONIX DCB 5X60X130 (BALLOONS) IMPLANT
CATH BEACON 5 .035 40 KMP TP (CATHETERS) IMPLANT
CATH MICROCATH PRGRT 2.8F 110 (CATHETERS) IMPLANT
COIL 400 COMPLEX SOFT 4X6CM (Vascular Products) IMPLANT
COIL 400 COMPLEX STD 3X5CM (Vascular Products) IMPLANT
COIL 400 COMPLEX STD 4X10CM (Vascular Products) IMPLANT
COIL 400 COMPLEX STD 6X30CM (Vascular Products) IMPLANT
COVER PROBE ULTRASOUND 5X96 (MISCELLANEOUS) IMPLANT
DEVICE OCCLUSION PODJ5 (Embolic) IMPLANT
DRAPE BRACHIAL (DRAPES) IMPLANT
GLIDEWIRE ADV .035X180CM (WIRE) IMPLANT
GOWN STRL REUS W/ TWL LRG LVL3 (GOWN DISPOSABLE) ×1 IMPLANT
GOWN STRL REUS W/TWL LRG LVL3 (GOWN DISPOSABLE) ×1
HANDLE DETACHMENT COIL (MISCELLANEOUS) IMPLANT
KIT ENCORE 26 ADVANTAGE (KITS) IMPLANT
MICROCATH PROGREAT 2.8F 110 CM (CATHETERS) ×1
NDL ENTRY 21GA 7CM ECHOTIP (NEEDLE) IMPLANT
NEEDLE ENTRY 21GA 7CM ECHOTIP (NEEDLE) ×1 IMPLANT
OCCLUSION DEVICE PODJ5 (Embolic) ×1 IMPLANT
PACK ANGIOGRAPHY (CUSTOM PROCEDURE TRAY) ×1 IMPLANT
SET INTRO CAPELLA COAXIAL (SET/KITS/TRAYS/PACK) IMPLANT
SHEATH BRITE TIP 6FR X 23 (SHEATH) IMPLANT
SHEATH BRITE TIP 6FRX5.5 (SHEATH) IMPLANT
SUT MNCRL AB 4-0 PS2 18 (SUTURE) IMPLANT
WIRE GUIDERIGHT .035X150 (WIRE) IMPLANT

## 2022-10-23 NOTE — Interval H&P Note (Signed)
History and Physical Interval Note:  10/23/2022 1:48 PM  Bradley Lopez  has presented today for surgery, with the diagnosis of LUE Fistulagram   End Stage Renal.  The various methods of treatment have been discussed with the patient and family. After consideration of risks, benefits and other options for treatment, the patient has consented to  Procedure(s): A/V Fistulagram (Left) as a surgical intervention.  The patient's history has been reviewed, patient examined, no change in status, stable for surgery.  I have reviewed the patient's chart and labs.  Questions were answered to the patient's satisfaction.     Levora Dredge

## 2022-10-23 NOTE — Op Note (Signed)
OPERATIVE NOTE   PROCEDURE: Contrast injection left arm brachiocephalic AV access Percutaneous transluminal angioplasty at the arterial anastomosis left brachiocephalic, also in the mid portion of the fistula, both peripheral. Coil embolization of a large tributary from the midportion of the AV fistula  PRE-OPERATIVE DIAGNOSIS: Complication of dialysis access                                                       End Stage Renal Disease  POST-OPERATIVE DIAGNOSIS: same as above   SURGEON: Renford Dills, M.D.  ANESTHESIA: Conscious sedation was administered under my direct supervision by the interventional radiology RN. IV Versed plus fentanyl were utilized. Continuous ECG, pulse oximetry and blood pressure was monitored throughout the entire procedure.  Conscious sedation was for a total of 76 minutes.  ESTIMATED BLOOD LOSS: minimal  FINDING(S): Stricture of the AV graft  SPECIMEN(S):  None  CONTRAST: 40 cc  FLUOROSCOPY TIME: 14.2 minutes  INDICATIONS: Bradley Lopez is a 47 y.o. male who  presents with malfunctioning left arm AV access.  The patient is scheduled for angiography with possible intervention of the AV access.  The patient is aware the risks include but are not limited to: bleeding, infection, thrombosis of the cannulated access, and possible anaphylactic reaction to the contrast.  The patient acknowledges if the access can not be salvaged a tunneled catheter will be needed and will be placed during this procedure.  The patient is aware of the risks of the procedure and elects to proceed with the angiogram and intervention.  DESCRIPTION: After full informed written consent was obtained, the patient was brought back to the Special Procedure suite and placed supine position.  Appropriate cardiopulmonary monitors were placed.  The left arm was prepped and draped in the standard fashion.  Appropriate timeout is called. The left brachiocephalic fistula was cannulated with a  micropuncture needle at the level of the deltoid in a retrograde direction.  Cannulation was performed with ultrasound guidance. Ultrasound was placed in a sterile sleeve, the AV access was interrogated and noted to be echolucent and compressible indicating patency. Image was recorded for the permanent record. The puncture is performed under continuous ultrasound visualization.   The microwire was advanced and the needle was exchanged for  a microsheath.  The J-wire was then advanced and a 6 Fr sheath inserted.  Advantage wire and a Kumpe catheter were then negotiated into the brachial artery.  Hand injections were completed to image the access from the arterial anastomosis through the entire access.    Based on the images,  4000 units of heparin was given and a wire was negotiated through the stricture at the level of the arterial anastomosis.  A 5 mm x 60 mm Lutonix drug-eluting balloon was used.  Inflation was to 8 atm for 1 minute.  Follow-up imaging demonstrates complete resolution of the stricture with rapid flow of contrast through the fistula.  A retained valve was also noted in the midportion of the fistula.  Magnified imaging of this was performed and an 8 mm x 40 mm Lutonix drug-eluting balloon was inflated across this lesion.  Inflation was to 8 atm for 1 minute.  Follow-up imaging demonstrated total resolution of this retained valve.  The Kumpe catheter and advantage wire were then used to select a large tributary which  demonstrated significant retrograde flow down his arm and then back up through the deep system.  Once this vein to been selected a prograde catheter was introduced.  There were 3 separate branches involved with this tributary.  I selected the largest first and placed a 6 mm x 10 cm standard Ruby coil followed by two 4 x 6 cm soft coils and then a 5 cm packing coil.  A 3 x 5 cm standard Ruby coil was then placed in the smallest of the 3 tributaries and lastly a 6 mm x 30 cm standard  was placed and the final tributary or branch of this cluster.  The Kumpe catheter was then advanced toward the arterial anastomosis and final imaging was performed.  A 4-0 Monocryl purse-string suture was sewn around the sheath.  The sheath was removed and light pressure was applied.  A sterile bandage was applied to the puncture site.  Interpretation: Initial imaging demonstrated the previously noted irregularity at the arterial anastomosis.  Although this does not appear to be a string sign the dialysis center continues to report major difficulties with this fistula and inability to utilize it and therefore this appears to be a significant factor in that and I elected to treat it.  A 5 mm balloon was advanced across this area and following angioplasty there was a marked improvement in flow with less than 10% residual stenosis.  Also noted at this time was a retained valve this appears to have been obscured in the previous angiogram by the tributaries that we also treated during this angiogram.  This was dilated with a 8 mm balloon and the results were excellent with less than 10% residual stenosis and the leaflets of the frozen valve were now plastered against the wall minimizing any turbulence or obstruction.  Lastly the large tributary that was noted previously was treated with coils as described above with excellent result.  COMPLICATIONS: None  CONDITION: Bradley Lopez, M.D [facility] Office: [phone]  10/23/2022 3:16 PM

## 2022-10-24 ENCOUNTER — Encounter: Payer: Self-pay | Admitting: Vascular Surgery

## 2022-11-08 ENCOUNTER — Encounter (INDEPENDENT_AMBULATORY_CARE_PROVIDER_SITE_OTHER): Payer: BC Managed Care – PPO

## 2022-11-08 ENCOUNTER — Ambulatory Visit (INDEPENDENT_AMBULATORY_CARE_PROVIDER_SITE_OTHER): Payer: BC Managed Care – PPO | Admitting: Vascular Surgery

## 2022-11-28 ENCOUNTER — Other Ambulatory Visit (INDEPENDENT_AMBULATORY_CARE_PROVIDER_SITE_OTHER): Payer: Self-pay | Admitting: Vascular Surgery

## 2022-11-28 DIAGNOSIS — N186 End stage renal disease: Secondary | ICD-10-CM

## 2022-11-29 ENCOUNTER — Encounter (INDEPENDENT_AMBULATORY_CARE_PROVIDER_SITE_OTHER): Payer: BC Managed Care – PPO

## 2022-11-29 ENCOUNTER — Ambulatory Visit (INDEPENDENT_AMBULATORY_CARE_PROVIDER_SITE_OTHER): Payer: BC Managed Care – PPO | Admitting: Vascular Surgery

## 2022-11-30 DIAGNOSIS — J45909 Unspecified asthma, uncomplicated: Secondary | ICD-10-CM | POA: Diagnosis not present

## 2022-11-30 DIAGNOSIS — N186 End stage renal disease: Secondary | ICD-10-CM | POA: Diagnosis not present

## 2022-11-30 DIAGNOSIS — Z992 Dependence on renal dialysis: Secondary | ICD-10-CM | POA: Diagnosis not present

## 2022-11-30 DIAGNOSIS — I10 Essential (primary) hypertension: Secondary | ICD-10-CM | POA: Diagnosis not present

## 2022-11-30 DIAGNOSIS — F411 Generalized anxiety disorder: Secondary | ICD-10-CM | POA: Diagnosis not present

## 2022-11-30 DIAGNOSIS — E119 Type 2 diabetes mellitus without complications: Secondary | ICD-10-CM | POA: Diagnosis not present

## 2022-12-12 DIAGNOSIS — Z794 Long term (current) use of insulin: Secondary | ICD-10-CM | POA: Diagnosis not present

## 2022-12-12 DIAGNOSIS — E1140 Type 2 diabetes mellitus with diabetic neuropathy, unspecified: Secondary | ICD-10-CM | POA: Diagnosis not present

## 2022-12-12 DIAGNOSIS — L97521 Non-pressure chronic ulcer of other part of left foot limited to breakdown of skin: Secondary | ICD-10-CM | POA: Diagnosis not present

## 2023-01-28 DIAGNOSIS — H3343 Traction detachment of retina, bilateral: Secondary | ICD-10-CM | POA: Diagnosis not present

## 2023-03-25 DIAGNOSIS — E119 Type 2 diabetes mellitus without complications: Secondary | ICD-10-CM | POA: Diagnosis not present

## 2023-04-01 DIAGNOSIS — E119 Type 2 diabetes mellitus without complications: Secondary | ICD-10-CM | POA: Diagnosis not present

## 2023-04-01 DIAGNOSIS — Z992 Dependence on renal dialysis: Secondary | ICD-10-CM | POA: Diagnosis not present

## 2023-04-01 DIAGNOSIS — N186 End stage renal disease: Secondary | ICD-10-CM | POA: Diagnosis not present

## 2023-04-01 DIAGNOSIS — J45909 Unspecified asthma, uncomplicated: Secondary | ICD-10-CM | POA: Diagnosis not present

## 2023-04-01 DIAGNOSIS — N2581 Secondary hyperparathyroidism of renal origin: Secondary | ICD-10-CM | POA: Diagnosis not present

## 2023-04-01 DIAGNOSIS — F411 Generalized anxiety disorder: Secondary | ICD-10-CM | POA: Diagnosis not present

## 2023-04-01 DIAGNOSIS — I10 Essential (primary) hypertension: Secondary | ICD-10-CM | POA: Diagnosis not present

## 2023-04-22 ENCOUNTER — Encounter: Payer: Self-pay | Admitting: Oncology

## 2023-08-22 ENCOUNTER — Encounter: Payer: Self-pay | Admitting: Oncology

## 2023-08-22 ENCOUNTER — Other Ambulatory Visit: Payer: Self-pay | Admitting: Internal Medicine

## 2023-08-22 DIAGNOSIS — R079 Chest pain, unspecified: Secondary | ICD-10-CM

## 2023-08-23 ENCOUNTER — Encounter: Payer: Self-pay | Admitting: Oncology

## 2023-09-03 ENCOUNTER — Encounter: Payer: Self-pay | Admitting: Oncology

## 2023-09-04 ENCOUNTER — Telehealth (HOSPITAL_COMMUNITY): Payer: Self-pay | Admitting: *Deleted

## 2023-09-04 NOTE — Telephone Encounter (Signed)
 Reaching out to patient to offer assistance regarding upcoming cardiac imaging study; pt verbalizes understanding of appt date/time, parking situation and where to check in, pre-test NPO status and medications ordered, and verified current allergies; name and call back number provided for further questions should they arise Johney Frame RN Navigator Cardiac Imaging [facility] [phone] office [phone] cell

## 2023-09-05 ENCOUNTER — Ambulatory Visit
Admission: RE | Admit: 2023-09-05 | Discharge: 2023-09-05 | Disposition: A | Discharge status: Home / Self Care | Source: Ambulatory Visit | Attending: Internal Medicine | Admitting: Internal Medicine

## 2023-09-05 DIAGNOSIS — R079 Chest pain, unspecified: Secondary | ICD-10-CM | POA: Diagnosis present

## 2023-09-05 MED ORDER — NITROGLYCERIN 0.4 MG SL SUBL
0.8000 mg | SUBLINGUAL_TABLET | Freq: Once | SUBLINGUAL | Status: AC
  Administered 2023-09-05: 0.8 mg via SUBLINGUAL

## 2023-09-05 MED ORDER — IOHEXOL 350 MG/ML SOLN
80.0000 mL | Freq: Once | INTRAVENOUS | Status: AC | PRN
  Administered 2023-09-05: 80 mL via INTRAVENOUS

## 2023-09-05 MED ORDER — ONDANSETRON HCL 4 MG/2ML IJ SOLN
4.0000 mg | Freq: Once | INTRAMUSCULAR | Status: DC
  Filled 2023-09-05: qty 2

## 2023-09-05 MED ORDER — NITROGLYCERIN 0.4 MG SL SUBL
SUBLINGUAL_TABLET | SUBLINGUAL | Status: AC
  Filled 2023-09-05: qty 2

## 2023-09-05 MED ORDER — ONDANSETRON HCL 4 MG/2ML IJ SOLN
INTRAMUSCULAR | Status: AC
  Filled 2023-09-05: qty 2

## 2023-09-05 NOTE — Progress Notes (Signed)
 Tolerated Ct well after did become nauseated emesis x1. Stated feels better after emesis did not want Zofran . Drank ginger ale denies nausea.  Vitals stable ambulatory steady gait.

## 2023-09-12 ENCOUNTER — Emergency Department

## 2023-09-12 ENCOUNTER — Observation Stay
Admission: EM | Admit: 2023-09-12 | Discharge: 2023-09-14 | Disposition: A | Discharge status: Home / Self Care | Attending: Internal Medicine | Admitting: Internal Medicine

## 2023-09-12 ENCOUNTER — Encounter: Payer: Self-pay | Admitting: Internal Medicine

## 2023-09-12 ENCOUNTER — Other Ambulatory Visit: Payer: Self-pay

## 2023-09-12 ENCOUNTER — Inpatient Hospital Stay

## 2023-09-12 DIAGNOSIS — I6381 Other cerebral infarction due to occlusion or stenosis of small artery: Secondary | ICD-10-CM | POA: Diagnosis not present

## 2023-09-12 DIAGNOSIS — R531 Weakness: Secondary | ICD-10-CM | POA: Diagnosis not present

## 2023-09-12 DIAGNOSIS — N186 End stage renal disease: Secondary | ICD-10-CM | POA: Diagnosis not present

## 2023-09-12 DIAGNOSIS — Z794 Long term (current) use of insulin: Secondary | ICD-10-CM | POA: Diagnosis not present

## 2023-09-12 DIAGNOSIS — Z7984 Long term (current) use of oral hypoglycemic drugs: Secondary | ICD-10-CM | POA: Diagnosis not present

## 2023-09-12 DIAGNOSIS — Z7982 Long term (current) use of aspirin: Secondary | ICD-10-CM | POA: Insufficient documentation

## 2023-09-12 DIAGNOSIS — I120 Hypertensive chronic kidney disease with stage 5 chronic kidney disease or end stage renal disease: Secondary | ICD-10-CM | POA: Diagnosis not present

## 2023-09-12 DIAGNOSIS — Z992 Dependence on renal dialysis: Secondary | ICD-10-CM | POA: Diagnosis not present

## 2023-09-12 DIAGNOSIS — Z79899 Other long term (current) drug therapy: Secondary | ICD-10-CM | POA: Insufficient documentation

## 2023-09-12 DIAGNOSIS — D649 Anemia, unspecified: Secondary | ICD-10-CM | POA: Diagnosis not present

## 2023-09-12 DIAGNOSIS — J45909 Unspecified asthma, uncomplicated: Secondary | ICD-10-CM | POA: Diagnosis not present

## 2023-09-12 DIAGNOSIS — R200 Anesthesia of skin: Secondary | ICD-10-CM | POA: Diagnosis present

## 2023-09-12 DIAGNOSIS — E1122 Type 2 diabetes mellitus with diabetic chronic kidney disease: Secondary | ICD-10-CM | POA: Insufficient documentation

## 2023-09-12 DIAGNOSIS — I959 Hypotension, unspecified: Secondary | ICD-10-CM | POA: Diagnosis not present

## 2023-09-12 DIAGNOSIS — I639 Cerebral infarction, unspecified: Principal | ICD-10-CM

## 2023-09-12 LAB — COMPREHENSIVE METABOLIC PANEL WITH GFR
ALT: 15 U/L (ref 0–44)
AST: 15 U/L (ref 15–41)
Albumin: 4.5 g/dL (ref 3.5–5.0)
Alkaline Phosphatase: 73 U/L (ref 38–126)
Anion gap: 13 (ref 5–15)
BUN: 33 mg/dL — ABNORMAL HIGH (ref 6–20)
CO2: 30 mmol/L (ref 22–32)
Calcium: 9 mg/dL (ref 8.9–10.3)
Chloride: 98 mmol/L (ref 98–111)
Creatinine, Ser: 8.56 mg/dL — ABNORMAL HIGH (ref 0.61–1.24)
GFR, Estimated: 7 mL/min — ABNORMAL LOW (ref >60)
Glucose, Bld: 123 mg/dL — ABNORMAL HIGH (ref 70–99)
Potassium: 4.3 mmol/L (ref 3.5–5.1)
Sodium: 141 mmol/L (ref 135–145)
Total Bilirubin: 0.8 mg/dL (ref 0.0–1.2)
Total Protein: 8.3 g/dL — ABNORMAL HIGH (ref 6.5–8.1)

## 2023-09-12 LAB — DIFFERENTIAL
Abs Immature Granulocytes: 0.01 10*3/uL (ref 0.00–0.07)
Basophils Absolute: 0.1 10*3/uL (ref 0.0–0.1)
Basophils Relative: 1 %
Eosinophils Absolute: 0.6 10*3/uL — ABNORMAL HIGH (ref 0.0–0.5)
Eosinophils Relative: 10 %
Immature Granulocytes: 0 %
Lymphocytes Relative: 23 %
Lymphs Abs: 1.6 10*3/uL (ref 0.7–4.0)
Monocytes Absolute: 0.8 10*3/uL (ref 0.1–1.0)
Monocytes Relative: 11 %
Neutro Abs: 3.6 10*3/uL (ref 1.7–7.7)
Neutrophils Relative %: 55 %

## 2023-09-12 LAB — PROTIME-INR
INR: 1 (ref 0.8–1.2)
Prothrombin Time: 13.8 s (ref 11.4–15.2)

## 2023-09-12 LAB — HEMOGLOBIN A1C
Hgb A1c MFr Bld: 6.1 % — ABNORMAL HIGH (ref 4.8–5.6)
Mean Plasma Glucose: 128.37 mg/dL

## 2023-09-12 LAB — CBC
HCT: 38.2 % — ABNORMAL LOW (ref 39.0–52.0)
Hemoglobin: 12.4 g/dL — ABNORMAL LOW (ref 13.0–17.0)
MCH: 30.5 pg (ref 26.0–34.0)
MCHC: 32.5 g/dL (ref 30.0–36.0)
MCV: 93.9 fL (ref 80.0–100.0)
Platelets: 240 10*3/uL (ref 150–400)
RBC: 4.07 MIL/uL — ABNORMAL LOW (ref 4.22–5.81)
RDW: 13.7 % (ref 11.5–15.5)
WBC: 6.6 10*3/uL (ref 4.0–10.5)
nRBC: 0 % (ref 0.0–0.2)

## 2023-09-12 LAB — GLUCOSE, CAPILLARY: Glucose-Capillary: 112 mg/dL — ABNORMAL HIGH (ref 70–99)

## 2023-09-12 LAB — CBG MONITORING, ED: Glucose-Capillary: 124 mg/dL — ABNORMAL HIGH (ref 70–99)

## 2023-09-12 LAB — ETHANOL: Alcohol, Ethyl (B): <15 mg/dL (ref <15)

## 2023-09-12 LAB — APTT: aPTT: 31 s (ref 24–36)

## 2023-09-12 MED ORDER — SENNOSIDES-DOCUSATE SODIUM 8.6-50 MG PO TABS: 1.0000 | ORAL_TABLET | Freq: Every evening | ORAL | Status: DC | PRN

## 2023-09-12 MED ORDER — FERRIC CITRATE 1 GM 210 MG(FE) PO TABS
210.0000 mg | ORAL_TABLET | Freq: Three times a day (TID) | ORAL | Status: DC
  Administered 2023-09-13 – 2023-09-14 (×5): 210 mg via ORAL
  Filled 2023-09-12 (×6): qty 1

## 2023-09-12 MED ORDER — ACETAMINOPHEN 325 MG PO TABS
650.0000 mg | ORAL_TABLET | ORAL | Status: DC | PRN
  Administered 2023-09-13: 650 mg via ORAL

## 2023-09-12 MED ORDER — ACETAMINOPHEN 160 MG/5ML PO SOLN: 650.0000 mg | ORAL | Status: DC | PRN

## 2023-09-12 MED ORDER — ALBUTEROL SULFATE HFA 108 (90 BASE) MCG/ACT IN AERS: 2.0000 | INHALATION_SPRAY | Freq: Four times a day (QID) | RESPIRATORY_TRACT | Status: DC | PRN

## 2023-09-12 MED ORDER — STROKE: EARLY STAGES OF RECOVERY BOOK: Freq: Once | Status: AC

## 2023-09-12 MED ORDER — CLOPIDOGREL BISULFATE 75 MG PO TABS
75.0000 mg | ORAL_TABLET | Freq: Every day | ORAL | Status: DC
  Administered 2023-09-12 – 2023-09-14 (×3): 75 mg via ORAL
  Filled 2023-09-12 (×3): qty 1

## 2023-09-12 MED ORDER — ACETAMINOPHEN 650 MG RE SUPP: 650.0000 mg | RECTAL | Status: DC | PRN

## 2023-09-12 MED ORDER — ASPIRIN 81 MG PO TBEC
81.0000 mg | DELAYED_RELEASE_TABLET | Freq: Every day | ORAL | Status: DC
  Administered 2023-09-13 – 2023-09-14 (×2): 81 mg via ORAL
  Filled 2023-09-12 (×2): qty 1

## 2023-09-12 MED ORDER — ALBUTEROL SULFATE (2.5 MG/3ML) 0.083% IN NEBU: 2.5000 mg | INHALATION_SOLUTION | Freq: Four times a day (QID) | RESPIRATORY_TRACT | Status: DC | PRN

## 2023-09-12 MED ORDER — LABETALOL HCL 5 MG/ML IV SOLN: 10.0000 mg | INTRAVENOUS | Status: DC | PRN

## 2023-09-12 MED ORDER — CLOPIDOGREL BISULFATE 75 MG PO TABS: 75.0000 mg | ORAL_TABLET | Freq: Every day | ORAL | Status: DC

## 2023-09-12 MED ORDER — URSODIOL 300 MG PO CAPS
300.0000 mg | ORAL_CAPSULE | Freq: Three times a day (TID) | ORAL | Status: DC
  Administered 2023-09-12 – 2023-09-14 (×5): 300 mg via ORAL
  Filled 2023-09-12 (×7): qty 1

## 2023-09-12 MED ORDER — ATORVASTATIN CALCIUM 20 MG PO TABS
40.0000 mg | ORAL_TABLET | Freq: Every day | ORAL | Status: DC
  Administered 2023-09-13 – 2023-09-14 (×2): 40 mg via ORAL
  Filled 2023-09-12 (×2): qty 2

## 2023-09-12 MED ORDER — RENA-VITE PO TABS
1.0000 | ORAL_TABLET | Freq: Every day | ORAL | Status: DC
  Administered 2023-09-12 – 2023-09-13 (×2): 1 via ORAL
  Filled 2023-09-12 (×3): qty 1

## 2023-09-12 MED ORDER — INSULIN GLARGINE-YFGN 100 UNIT/ML ~~LOC~~ SOLN
16.0000 [IU] | Freq: Every day | SUBCUTANEOUS | Status: DC
  Administered 2023-09-12 – 2023-09-14 (×3): 16 [IU] via SUBCUTANEOUS
  Filled 2023-09-12 (×4): qty 0.16

## 2023-09-12 MED ORDER — INSULIN ASPART 100 UNIT/ML IJ SOLN
0.0000 [IU] | Freq: Three times a day (TID) | INTRAMUSCULAR | Status: DC
Start: 2023-09-13 — End: 2023-09-14
  Administered 2023-09-13 – 2023-09-14 (×2): 1 [IU] via SUBCUTANEOUS
  Filled 2023-09-12 (×2): qty 1

## 2023-09-12 MED ORDER — SERTRALINE HCL 50 MG PO TABS
50.0000 mg | ORAL_TABLET | Freq: Every day | ORAL | Status: DC
  Administered 2023-09-12 – 2023-09-14 (×3): 50 mg via ORAL
  Filled 2023-09-12 (×3): qty 1

## 2023-09-12 MED ORDER — MIDODRINE HCL 5 MG PO TABS: 2.5000 mg | ORAL_TABLET | Freq: Three times a day (TID) | ORAL | Status: DC | PRN

## 2023-09-12 MED ORDER — SODIUM CHLORIDE 0.9% FLUSH: 3.0000 mL | Freq: Once | INTRAVENOUS | Status: DC

## 2023-09-12 NOTE — ED Triage Notes (Signed)
 Pt via POV from home. Pt c/o sudden onset of R sided facial, arm, and leg numbness. States it started 4:00pm yesterday after his dialysis treatment. Denies pain. States he felt some weakness in the R arm because he was having trouble taking the tape off his dialysis port. No facial droop. Bilateral hand grip equal. Minimal drift to the R arm but no visual changes, aphasia, or neglect. VAN negative. Dialysis MWF has not missed any treatments and completed full treatment yesterday. Pt is A&Ox4 and NAD

## 2023-09-12 NOTE — ED Notes (Signed)
 Notified provider  Andy Bannister, MD, of pt request to speak with a provider regarding MRI results. Per pt "she can call my cell phone". Phone number given to provider via secure chat.

## 2023-09-12 NOTE — H&P (Addendum)
 History and Physical    Bradley Lopez GNF:[id number] DOB: 09-05-1975 DOA: 09/12/2023  PCP: Little Riff, MD  Patient coming from: home  I have personally briefly reviewed patient's old medical records in [facility]  Chief Complaint: right sided numbness  HPI: Bradley Lopez is a 48 y.o. male with medical history significant of  ESRD on HD MWF,  DMII, Anemia, Asthma , HTN  who presents to ED with complaint of right sided numbness. Notes numbness affects right lower face and right arm and leg.  Patient states symptoms started 4 pm yesterday. He states got concerned as he noted persistent symptoms this am and feels weaker on right side as well.  He notes confusion, vision changes,no speech difficulties, or difficulty swallowing. He denies fever/chills/ chest  pain or sob. He however dose note episode of n/v.  ED Course:  Vitals:afeb b 90/56 hr 58, rr 18, sat 94%  Labs: Wbc 6.6, hgb 12.4, plt 240 Na 141, K 4.3, CL 98, glu 123, cr 8.56 Snr 58  CTH:neg  Review of Systems: As per HPI otherwise 10 point review of systems negative.   Past Medical History:  Diagnosis Date   Allergy    Anemia    Asthma    Chronic kidney disease    Depression    situational   Diabetes mellitus without complication (HCC)    Hypertension    Pneumonia     Past Surgical History:  Procedure Laterality Date   A/V FISTULAGRAM N/A 02/23/2022   Procedure: A/V Fistulagram;  Surgeon: Jackquelyn Mass, MD;  Location: [facility] INVASIVE CV LAB;  Service: Cardiovascular;  Laterality: N/A;   A/V FISTULAGRAM Left 07/24/2022   Procedure: A/V Fistulagram;  Surgeon: Jackquelyn Mass, MD;  Location: [facility] INVASIVE CV LAB;  Service: Cardiovascular;  Laterality: Left;   A/V FISTULAGRAM Left 09/04/2022   Procedure: A/V Fistulagram;  Surgeon: Jackquelyn Mass, MD;  Location: [facility] INVASIVE CV LAB;  Service: Cardiovascular;  Laterality: Left;   A/V FISTULAGRAM Left 10/23/2022   Procedure: A/V Fistulagram;  Surgeon:  Jackquelyn Mass, MD;  Location: [facility] INVASIVE CV LAB;  Service: Cardiovascular;  Laterality: Left;   AV FISTULA PLACEMENT Left 01/03/2022   Procedure: ARTERIOVENOUS (AV) FISTULA CREATION (BRACHIALCEPHALIC);  Surgeon: Jackquelyn Mass, MD;  Location: [facility] ORS;  Service: Vascular;  Laterality: Left;   COLONOSCOPY N/A 09/22/2021   Procedure: COLONOSCOPY;  Surgeon: Quintin Buckle, DO;  Location: [facility] ENDOSCOPY;  Service: Gastroenterology;  Laterality: N/A;   DIALYSIS/PERMA CATHETER INSERTION N/A 02/23/2022   Procedure: DIALYSIS/PERMA CATHETER INSERTION;  Surgeon: Jackquelyn Mass, MD;  Location: [facility] INVASIVE CV LAB;  Service: Cardiovascular;  Laterality: N/A;   DIALYSIS/PERMA CATHETER INSERTION N/A 07/13/2022   Procedure: DIALYSIS/PERMA CATHETER INSERTION;  Surgeon: Celso College, MD;  Location: [facility] INVASIVE CV LAB;  Service: Cardiovascular;  Laterality: N/A;   DIALYSIS/PERMA CATHETER REMOVAL N/A 05/21/2022   Procedure: DIALYSIS/PERMA CATHETER REMOVAL;  Surgeon: Celso College, MD;  Location: [facility] INVASIVE CV LAB;  Service: Cardiovascular;  Laterality: N/A;   EYE SURGERY Bilateral    detatch retina   TEMPORARY DIALYSIS CATHETER Right 02/19/2022   Procedure: TEMPORARY DIALYSIS CATHETER;  Surgeon: Celso College, MD;  Location: [facility] INVASIVE CV LAB;  Service: Cardiovascular;  Laterality: Right;     reports that he has never smoked. He has never been exposed to tobacco smoke. He has never used smokeless tobacco. He reports that he does not drink alcohol and does not use drugs.  Allergies  Allergen Reactions   Pine Hives and Itching    Family History  Problem Relation Age of Onset   Coronary artery disease Mother    Hypertension Father    Prostate cancer Father     Prior to Admission medications   Medication Sig Start Date End Date Taking? Authorizing Provider  albuterol (VENTOLIN HFA) 108 (90 Base) MCG/ACT inhaler Inhale 2 puffs into the lungs every 6 (six) hours as needed. 12/15/21  12/15/22  [provider]  amLODipine  (NORVASC ) 10 MG tablet Take 1 tablet (10 mg total) by mouth daily. Patient not taking: Reported on 07/13/2022 01/07/21   Magdalene School, MD  aspirin  EC 81 MG EC tablet Take 1 tablet (81 mg total) by mouth daily. Swallow whole. 01/08/21   Magdalene School, MD  atorvastatin  (LIPITOR) 40 MG tablet Take 40 mg by mouth daily.    [provider]  atorvastatin  (LIPITOR) 80 MG tablet Take 1 tablet (80 mg total) by mouth daily. 01/08/21   Magdalene School, MD  B Complex-C-Folic Acid  (RENA-VITE RX) 1 MG TABS Take 1 tablet by mouth daily.    [provider]  calcitRIOL  (ROCALTROL ) 0.5 MCG capsule Take 0.5 mcg by mouth daily. 01/10/22   [provider]  carvedilol  (COREG ) 12.5 MG tablet Take 1 tablet (12.5 mg total) by mouth 2 (two) times daily. Patient not taking: Reported on 07/13/2022 02/23/22   Krishnan, Sendil K, MD  cloNIDine  (CATAPRES ) 0.3 MG tablet Take 1 tablet (0.3 mg total) by mouth 2 (two) times daily. 02/23/22   Krishnan, Sendil K, MD  furosemide  (LASIX ) 40 MG tablet Take 1 tablet (40 mg total) by mouth 2 (two) times daily. Patient not taking: Reported on 07/13/2022 06/17/20   Donaciano Frizzle, MD  glipiZIDE (GLUCOTROL XL) 5 MG 24 hr tablet Take 5 mg by mouth daily. 08/20/20   [provider]  insulin  detemir (LEVEMIR ) 100 UNIT/ML injection Inject 0.1 mLs (10 Units total) into the skin daily after breakfast. Patient taking differently: Inject 20 Units into the skin daily after breakfast. 02/23/22   Krishnan, Sendil K, MD  LEVEMIR  FLEXPEN 100 UNIT/ML FlexPen Inject 18 Units into the skin daily. 09/04/21   [provider]  lidocaine -prilocaine  (EMLA ) cream Apply 1 Application topically daily.    [provider]  losartan  (COZAAR ) 100 MG tablet Take 1 tablet (100 mg total) by mouth daily. Patient not taking: Reported on 07/13/2022 02/24/22   Krishnan, Sendil K, MD  metolazone  (ZAROXOLYN ) 5 MG tablet Take 5 mg by mouth  daily.    [provider]  metoprolol  tartrate (LOPRESSOR ) 100 MG tablet Take 100 mg by mouth 2 (two) times daily.    [provider]  midodrine  (PROAMATINE ) 5 MG tablet Take 1 tablet (5 mg total) by mouth every Monday, Wednesday, and Friday with hemodialysis. 02/26/22   Krishnan, Sendil K, MD  mirtazapine  (REMERON ) 15 MG tablet Take 15 mg by mouth at bedtime.    [provider]  multivitamin (RENA-VIT) TABS tablet Take 1 tablet by mouth at bedtime. 02/23/22   Krishnan, Sendil K, MD  prednisoLONE acetate (PRED FORTE) 1 % ophthalmic suspension SMARTSIG:In Eye(s) Patient not taking: Reported on 07/13/2022 01/31/22   [provider]  sertraline (ZOLOFT) 50 MG tablet Take 50 mg by mouth daily.    [provider]  sevelamer carbonate (RENVELA) 800 MG tablet Take 800 mg by mouth 2 (two) times daily with a meal. 01/19/22   [provider]  ursodiol  (ACTIGALL ) 300 MG capsule Take by  mouth. 01/17/22   [provider]    Physical Exam: Vitals:   09/12/23 0847 09/12/23 0853 09/12/23 1030 09/12/23 1053  BP:  (!) 90/56 104/62   Pulse:  (!) 58 (!) 53   Resp:  18 11   Temp:    98.1 F (36.7 C)  TempSrc:  Oral  Oral  SpO2:  94% 98%   Weight: 93 kg     Height: 6\' 3"  (1.905 m)       Constitutional: NAD, calm, comfortable Vitals:   09/12/23 0847 09/12/23 0853 09/12/23 1030 09/12/23 1053  BP:  (!) 90/56 104/62   Pulse:  (!) 58 (!) 53   Resp:  18 11   Temp:    98.1 F (36.7 C)  TempSrc:  Oral  Oral  SpO2:  94% 98%   Weight: 93 kg     Height: 6\' 3"  (1.905 m)      Eyes: right eye less reactive than left,  lids and conjunctivae normal ENMT: Mucous membranes are moist. Posterior pharynx clear of any exudate or lesions.Normal dentition.  Neck: normal, supple, no masses, no thyromegaly Respiratory: clear to auscultation bilaterally, no wheezing, no crackles. Normal respiratory effort. No accessory muscle use.  Cardiovascular: Regular rate and  rhythm, no murmurs / rubs / gallops. No extremity edema. 2+ pedal pulses.  Abdomen: no tenderness, no masses palpated. No hepatosplenomegaly. Bowel sounds positive.  Musculoskeletal: no clubbing / cyanosis. No joint deformity upper and lower extremities. Good ROM, no contractures. Normal muscle tone.  Skin: no rashes, lesions, ulcers. No induration Neurologic: CN 3-12 grossly intact. Sensation intact, . Strength 5/5 on left 4+/5 on right    Psychiatric: Normal judgment and insight. Alert and oriented x 3. Normal mood.    Labs on Admission: I have personally reviewed following labs and imaging studies  CBC: Recent Labs  Lab 09/12/23 0848  WBC 6.6  NEUTROABS 3.6  HGB 12.4*  HCT 38.2*  MCV 93.9  PLT 240   Basic Metabolic Panel: Recent Labs  Lab 09/12/23 0848  NA 141  K 4.3  CL 98  CO2 30  GLUCOSE 123*  BUN 33*  CREATININE 8.56*  CALCIUM  9.0   GFR: Estimated Creatinine Clearance: 12.9 mL/min (A) (by C-G formula based on SCr of 8.56 mg/dL (H)). Liver Function Tests: Recent Labs  Lab 09/12/23 0848  AST 15  ALT 15  ALKPHOS 73  BILITOT 0.8  PROT 8.3*  ALBUMIN 4.5   No results for input(s): "LIPASE", "AMYLASE" in the last 168 hours. No results for input(s): "AMMONIA" in the last 168 hours. Coagulation Profile: Recent Labs  Lab 09/12/23 0848  INR 1.0   Cardiac Enzymes: No results for input(s): "CKTOTAL", "CKMB", "CKMBINDEX", "TROPONINI" in the last 168 hours. BNP (last 3 results) No results for input(s): "PROBNP" in the last 8760 hours. HbA1C: No results for input(s): "HGBA1C" in the last 72 hours. CBG: Recent Labs  Lab 09/12/23 0853  GLUCAP 124*   Lipid Profile: No results for input(s): "CHOL", "HDL", "LDLCALC", "TRIG", "CHOLHDL", "LDLDIRECT" in the last 72 hours. Thyroid  Function Tests: No results for input(s): "TSH", "T4TOTAL", "FREET4", "T3FREE", "THYROIDAB" in the last 72 hours. Anemia Panel: No results for input(s): "VITAMINB12", "FOLATE",  "FERRITIN", "TIBC", "IRON ", "RETICCTPCT" in the last 72 hours. Urine analysis:    Component Value Date/Time   COLORURINE STRAW (A) 02/20/2022 2040   APPEARANCEUR CLEAR (A) 02/20/2022 2040   LABSPEC 1.007 02/20/2022 2040   PHURINE 5.0 02/20/2022 2040   GLUCOSEU 50 (A) 02/20/2022  2040   HGBUR MODERATE (A) 02/20/2022 2040   BILIRUBINUR NEGATIVE 02/20/2022 2040   KETONESUR NEGATIVE 02/20/2022 2040   PROTEINUR >=300 (A) 02/20/2022 2040   NITRITE NEGATIVE 02/20/2022 2040   LEUKOCYTESUR NEGATIVE 02/20/2022 2040    Radiological Exams on Admission: CT HEAD WO CONTRAST Result Date: 09/12/2023 CLINICAL DATA:  Provided history: Neuro deficit, acute, stroke suspected. Right-sided numbness. Weakness. EXAM: CT HEAD WITHOUT CONTRAST TECHNIQUE: Contiguous axial images were obtained from the base of the skull through the vertex without intravenous contrast. RADIATION DOSE REDUCTION: This exam was performed according to the departmental dose-optimization program which includes automated exposure control, adjustment of the mA and/or kV according to patient size and/or use of iterative reconstruction technique. COMPARISON:  Brain MRI 01/05/2021. MRA head 01/05/2021. FINDINGS: Brain: No age-advanced or lobar predominant cerebral atrophy. There is no acute intracranial hemorrhage. No demarcated cortical infarct. No extra-axial fluid collection. No evidence of an intracranial mass. No midline shift. Vascular: No hyperdense vessel.  Atherosclerotic calcifications. Skull: No calvarial fracture or aggressive osseous lesion. Sinuses/Orbits: No mass or acute finding within the imaged orbits. Partial opacification of a posterior left ethmoid air cell. IMPRESSION: 1. No evidence of an acute intracranial abnormality. 2. Mild left ethmoid sinus disease. Electronically Signed   By: Bascom Lily D.O.   On: 09/12/2023 09:59    EKG: Independently reviewed.   Assessment/Plan Acute infarct within the left thalamus. 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 Several nonspecific punctate chronic microhemorrhages within the supratentorial brain, new from the prior MRI. -left sided numbness - CTH-negative  -admit tia/cva r/o  -continue asa, add plavix   -await neuro recs re further treatment  -neuro checks , SLP, PT/OT /echo   ESRD - on HD MWF -renal consult if patient not discharged in 48 hours   DMII -iss/fs  -resume lantus    Anemia -stable    Asthma  -no acute exacerbation  -continue on chronic inhalers    HTN -hypotension  -trial of midodrine    DVT prophylaxis: heparin  Code Status: full/ as discussed per patient wishes in event of cardiac arrest  Family Communication:  Disposition Plan: patient  expected to be admitted greater than 2 midnights  Consults called: neurology Dr Doretta Gant  Admission status: progressive care   Sabas Cradle MD [facility]   If 7PM-7AM, please contact night-coverage [email or web address] Password [email or web address]  09/12/2023, 1:36 PM

## 2023-09-12 NOTE — ED Provider Notes (Signed)
 [facility] Provider Note    Event Date/Time   First MD Initiated Contact with Patient 09/12/23 [phone]     (approximate)   History   Numbness   HPI  Bradley Lopez is a 48 y.o. male with a history of chronic kidney disease on dialysis, diabetes, hypertension who presents with complaints of right-sided numbness.  Patient reports yesterday he noticed mild numbness in his face, this has progressed since then to include his arm and his right leg.  He feels that his right leg is not as strong as his left.  No history of similar symptoms.  No trauma, no blood thinners.  He is visually impaired at baseline, no change reported     Physical Exam   Triage Vital Signs: ED Triage Vitals  Encounter Vitals Group     BP 09/12/23 0853 (!) 90/56     Systolic BP Percentile --      Diastolic BP Percentile --      Pulse Rate 09/12/23 0853 (!) 58     Resp 09/12/23 0853 18     Temp --      Temp Source 09/12/23 0853 Oral     SpO2 09/12/23 0853 94 %     Weight 09/12/23 0847 93 kg (205 lb)     Height 09/12/23 0847 1.905 m (6\' 3" )     Head Circumference --      Peak Flow --      Pain Score 09/12/23 0847 0     Pain Loc --      Pain Education --      Exclude from Growth Chart --     Most recent vital signs: Vitals:   09/12/23 1030 09/12/23 1053  BP: 104/62   Pulse: (!) 53   Resp: 11   Temp:  98.1 F (36.7 C)  SpO2: 98%      General: Awake, no distress.  CV:  Good peripheral perfusion.  Resp:  Normal effort.  Abd:  No distention.  Other:  Right facial droop noted, forehead spared.  Strength appears normal the upper extremities, right-sided lower extremity mildly weaker than left   ED Results / Procedures / Treatments   Labs (all labs ordered are listed, but only abnormal results are displayed) Labs Reviewed  CBC - Abnormal; Notable for the following components:      Result Value   RBC 4.07 (*)    Hemoglobin 12.4 (*)    HCT 38.2 (*)    All other  components within normal limits  DIFFERENTIAL - Abnormal; Notable for the following components:   Eosinophils Absolute 0.6 (*)    All other components within normal limits  COMPREHENSIVE METABOLIC PANEL WITH GFR - Abnormal; Notable for the following components:   Glucose, Bld 123 (*)    BUN 33 (*)    Creatinine, Ser 8.56 (*)    Total Protein 8.3 (*)    GFR, Estimated 7 (*)    All other components within normal limits  CBG MONITORING, ED - Abnormal; Notable for the following components:   Glucose-Capillary 124 (*)    All other components within normal limits  PROTIME-INR  APTT  ETHANOL     EKG  ED ECG REPORT I, Bryson Carbine, the attending physician, personally viewed and interpreted this ECG.  Date: 09/12/2023  Rhythm: Sinus bradycardia QRS Axis: normal Intervals: normal ST/T Wave abnormalities: normal Narrative Interpretation: no evidence of acute ischemia    RADIOLOGY CT head pending  PROCEDURES:  Critical Care performed:   Procedures   MEDICATIONS ORDERED IN ED: Medications  sodium chloride  flush (NS) 0.9 % injection 3 mL ( Intravenous Canceled Entry 09/12/23 0902)     IMPRESSION / MDM / ASSESSMENT AND PLAN / ED COURSE  I reviewed the triage vital signs and the nursing notes. Patient's presentation is most consistent with acute presentation with potential threat to life or bodily function.  Patient presents with right-sided numbness as detailed above, symptoms started yesterday around 4:30 PM.  Concerning for CVA.  He is outside of the window for tPA, not consistent with large vessel occlusion  Will send for CT head, obtain labs, consider MRI.  CT scan is reassuring, lab work unremarkable  I have ordered MRI, will consult the hospitalist for admission given strong suspicions for CVA      FINAL CLINICAL IMPRESSION(S) / ED DIAGNOSES   Final diagnoses:  Cerebrovascular accident (CVA), unspecified mechanism (HCC)     Rx / DC Orders   ED  Discharge Orders     None        Note:  This document was prepared using Dragon voice recognition software and may include unintentional dictation errors.   Bryson Carbine, MD 09/12/23 [phone]

## 2023-09-13 ENCOUNTER — Observation Stay

## 2023-09-13 ENCOUNTER — Inpatient Hospital Stay
Admit: 2023-09-13 | Discharge: 2023-09-13 | Disposition: A | Discharge status: Home / Self Care | Attending: Internal Medicine | Admitting: Internal Medicine

## 2023-09-13 DIAGNOSIS — I6381 Other cerebral infarction due to occlusion or stenosis of small artery: Secondary | ICD-10-CM | POA: Diagnosis not present

## 2023-09-13 DIAGNOSIS — I639 Cerebral infarction, unspecified: Secondary | ICD-10-CM | POA: Diagnosis not present

## 2023-09-13 LAB — LIPID PANEL
Cholesterol: 135 mg/dL (ref 0–200)
HDL: 34 mg/dL — ABNORMAL LOW (ref >40)
LDL Cholesterol: 73 mg/dL (ref 0–99)
Total CHOL/HDL Ratio: 4 ratio
Triglycerides: 140 mg/dL (ref <150)
VLDL: 28 mg/dL (ref 0–40)

## 2023-09-13 LAB — ECHOCARDIOGRAM COMPLETE
AR max vel: 2.29 cm2
AV Area VTI: 3.03 cm2
AV Area mean vel: 2.69 cm2
AV Mean grad: 5 mmHg
AV Peak grad: 10.1 mmHg
Ao pk vel: 1.59 m/s
Area-P 1/2: 7.16 cm2
Height: 75 in
MV VTI: 3.32 cm2
S' Lateral: 2.8 cm
Weight: 3280 [oz_av]

## 2023-09-13 LAB — GLUCOSE, CAPILLARY
Glucose-Capillary: 90 mg/dL (ref 70–99)
Glucose-Capillary: 183 mg/dL — ABNORMAL HIGH (ref 70–99)
Glucose-Capillary: 75 mg/dL (ref 70–99)
Glucose-Capillary: 162 mg/dL — ABNORMAL HIGH (ref 70–99)

## 2023-09-13 LAB — MRSA NEXT GEN BY PCR, NASAL: MRSA by PCR Next Gen: NOT DETECTED

## 2023-09-13 LAB — HIV ANTIBODY (ROUTINE TESTING W REFLEX): HIV Screen 4th Generation wRfx: NONREACTIVE

## 2023-09-13 MED ORDER — PENTAFLUOROPROP-TETRAFLUOROETH EX AERO: 1.0000 | INHALATION_SPRAY | CUTANEOUS | Status: DC | PRN

## 2023-09-13 MED ORDER — PENTAFLUOROPROP-TETRAFLUOROETH EX AERO
INHALATION_SPRAY | CUTANEOUS | Status: AC
  Filled 2023-09-13: qty 30

## 2023-09-13 MED ORDER — LIDOCAINE-PRILOCAINE 2.5-2.5 % EX CREA: 1.0000 | TOPICAL_CREAM | CUTANEOUS | Status: DC | PRN

## 2023-09-13 MED ORDER — HEPARIN SODIUM (PORCINE) 1000 UNIT/ML DIALYSIS: 1000.0000 [IU] | INTRAMUSCULAR | Status: DC | PRN

## 2023-09-13 MED ORDER — CHLORHEXIDINE GLUCONATE CLOTH 2 % EX PADS
6.0000 | MEDICATED_PAD | Freq: Every day | CUTANEOUS | Status: DC
  Administered 2023-09-13: 6 via TOPICAL

## 2023-09-13 MED ORDER — ACETAMINOPHEN 325 MG PO TABS
ORAL_TABLET | ORAL | Status: AC
  Filled 2023-09-13: qty 2

## 2023-09-13 NOTE — Progress Notes (Signed)
*  PRELIMINARY RESULTS* Echocardiogram 2D Echocardiogram has been performed.  Bradley Lopez 09/13/2023, 2:21 PM

## 2023-09-13 NOTE — Evaluation (Signed)
 Occupational Therapy Evaluation Patient Details Name: Bradley Lopez MRN: [id number] DOB: 04/28/1975 Today's Date: 09/13/2023   History of Present Illness   Pt is a 48 year old male presented to the ED with c/o R sided numbness in R lower face, RUE/RLE; Imaging showing Acute infarct within the left thalamus.  Chronic lacunar infarcts within the posterior limb of right internal capsule and right thalamus, left cerebellar hemisphere new from the prior MRI of 01/05/2021. Several nonspecific punctate chronic microhemorrhages within the  supratentorial brain, new from the prior MRI.      PMH significant for ESRD on HD MWF,  DMII, Anemia, Asthma , HTN     Clinical Impressions Chart reviewed, pt greeted in room, alert and oriented x4, agreeable to OT evaluation. PTA pt is MODI-I in ADL, has assist for transportation and med management due to baseline vision issues. Pt reports continued R sided sensation changes and a ?mild R hand FMC deficits but pt is able to use R hand functionally throughout package/container management and ADL tasks. Pt is performing ADL/functional mobility close to current baseline, discussed recommendations with pt with no further OT follow up recommended. OT will sign off. Please re consult if there is a change in functional status.      If plan is discharge home, recommend the following:   Direct supervision/assist for medications management;Assistance with cooking/housework;Assist for transportation     Functional Status Assessment   Patient has had a recent decline in their functional status and demonstrates the ability to make significant improvements in function in a reasonable and predictable amount of time.     Equipment Recommendations   None recommended by OT     Recommendations for Other Services         Precautions/Restrictions   Precautions Precautions: Fall Recall of Precautions/Restrictions: Intact Restrictions Weight Bearing Restrictions  Per Provider Order: No     Mobility Bed Mobility Overal bed mobility: Independent                  Transfers Overall transfer level: Independent                        Balance Overall balance assessment: Needs assistance Sitting-balance support: Feet supported Sitting balance-Leahy Scale: Normal     Standing balance support: No upper extremity supported Standing balance-Leahy Scale: Good                             ADL either performed or assessed with clinical judgement   ADL Overall ADL's : Modified independent;At baseline                                       General ADL Comments: supervision- MOD I for toilet transfer, toileting, amb approx 100' with no AD     Vision Baseline Vision/History: 2 Legally blind Patient Visual Report: No change from baseline Vision Assessment?: Yes Additional Comments: pt is able to track items when they are in his close visual field; he accesses his environment in the room safely; he reports his vision is unchanged from baseline     Perception Perception: Within Functional Limits       Praxis Praxis: [facility]       Pertinent Vitals/Pain Pain Assessment Pain Assessment: No/denies pain     Extremity/Trunk Assessment Upper Extremity Assessment Upper Extremity  Assessment: Right hand dominant;RUE deficits/detail RUE Deficits / Details: RUE strength grossly 5/5, grip WFL; diminished sensation distal to elbow; ?mild R hand FMC deficits but pt uses R hand functionally RUE Sensation: decreased light touch RUE Coordination: WNL   Lower Extremity Assessment Lower Extremity Assessment: Defer to PT evaluation RLE Deficits / Details: RLE strength grossly 4+/5 with exception of 3+/5 hip flex strength RLE Sensation: WNL RLE Coordination: WNL LLE Deficits / Details: LLE strength 5/5 LLE Sensation: WNL LLE Coordination: WNL       Communication Communication Communication: No apparent  difficulties   Cognition Arousal: Alert Behavior During Therapy: WFL for tasks assessed/performed Cognition: No apparent impairments                               Following commands: Intact       Cueing  General Comments   Cueing Techniques: Verbal cues      Exercises Other Exercises Other Exercises: edu re: role of OT, role of rehab, discharge recommendations   Shoulder Instructions      Home Living Family/patient expects to be discharged to:: Private residence Living Arrangements: Spouse/significant other Available Help at Discharge: Family;Available 24 hours/day Type of Home: House Home Access: Stairs to enter [employer] of Steps: 1 small threshold Entrance Stairs-Rails: None Home Layout: Two level;Able to live on main level with bedroom/bathroom Alternate Level Stairs-Number of Steps: 14 Alternate Level Stairs-Rails: Left Bathroom Shower/Tub: Producer, television/film/video: Standard     Home Equipment: None   Additional Comments: Spouse available intermittently but father lives close by and available 24/7 as needed      Prior Functioning/Environment Prior Level of Function : Independent/Modified Independent             Mobility Comments: Ind amb without an AD community distances, no fall history ADLs Comments: does not drive due to vision, wife helps w meds, Ind with ADLs    OT Problem List: Decreased activity tolerance   OT Treatment/Interventions:        OT Goals(Current goals can be found in the care plan section)       OT Frequency:       Co-evaluation              AM-PAC OT "6 Clicks" Daily Activity     Outcome Measure Help from another person eating meals?: None Help from another person taking care of personal grooming?: None Help from another person toileting, which includes using toliet, bedpan, or urinal?: None Help from another person bathing (including washing, rinsing, drying)?: None Help from  another person to put on and taking off regular upper body clothing?: None Help from another person to put on and taking off regular lower body clothing?: None 6 Click Score: 24   End of Session Nurse Communication: Mobility status  Activity Tolerance: Patient tolerated treatment well Patient left: in chair;with call bell/phone within reach                   Time: 1610-9604 OT Time Calculation (min): 25 min Charges:  OT General Charges $OT Visit: 1 Visit OT Evaluation $OT Eval Low Complexity: 1 Low  Gerre Kraft, OTD OTR/L  09/13/23, 11:15 AM

## 2023-09-13 NOTE — Plan of Care (Signed)
  Problem: Education: Goal: Knowledge of disease or condition will improve Outcome: Progressing Goal: Knowledge of secondary prevention will improve (MUST DOCUMENT ALL) Outcome: Progressing   Problem: Ischemic Stroke/TIA Tissue Perfusion: Goal: Complications of ischemic stroke/TIA will be minimized Outcome: Progressing   

## 2023-09-13 NOTE — TOC Progression Note (Signed)
 Transition of Care [facility]) - Progression Note    Patient Details  Name: Bradley Lopez MRN: [id number] Date of Birth: 12/02/1975  [facility] of [facility] CM/SW Contact  Arminda Landmark, RN Phone Number: 09/13/2023, 3:42 PM  Clinical Narrative:    Patient needs a code 1. Went to his room to get it signed but he's off the unit in dialysis. TOC to try again later.         Expected Discharge Plan and Services                                               Social Determinants of Health (SDOH) Interventions SDOH Screenings   Food Insecurity: No Food Insecurity (09/12/2023)  Housing: Low Risk  (09/12/2023)  Transportation Needs: No Transportation Needs (09/12/2023)  Utilities: Not At Risk (09/12/2023)  Physical Activity: Sufficiently Active (01/22/2018)   Received from [facility], [facility]  Stress: Stress Concern Present (01/22/2018)   Received from [facility], [facility]  Tobacco Use: Low Risk  (09/12/2023)    Readmission Risk Interventions     No data to display

## 2023-09-13 NOTE — Evaluation (Signed)
 Physical Therapy Evaluation Patient Details Name: IRVAN TIEDT MRN: [id number] DOB: 1975-07-12 Today's Date: 09/13/2023  History of Present Illness  Pt is a 48 y.o. male with medical history significant of ESRD on HD MWF,  DMII, anemia, asthma, and HTN who presents to ED with complaint of right sided numbness of the lower face and right arm and leg.  MD assessment includes: acute infarct within the left thalamus, chronic lacunar infarcts within the posterior limb of right internal capsule and right thalamus, left cerebellar hemisphere new from the prior MRI on 01/05/21, several nonspecific punctate chronic microhemorrhages within the supratentorial brain   Clinical Impression  Pt was pleasant and motivated to participate during the session and put forth good effort throughout. Pt required no physical assistance during the session and was generally steady with transfers and dynamic gait.  Of note pt did present with instability with feet together and during SLS as well as with two instances of mild R knee buckling during gait but was able to self-correct throughout without assist.  Pt also presented with intact but mildly diminished sensation to light touch in both the RUE and RLE with proprioception intact.  Pt's RLE strength was 4+/5 throughout except for 3+/5 R hip flex strength with LLE strength 5/5 throughout.  Pt reported feeling close to his baseline functionally and declined any DME at discharge but was agreeable to OPPT to address mild balance and strength deficits.  Pt will benefit from continued PT services upon discharge to safely address deficits listed in patient problem list for decreased caregiver assistance and eventual return to PLOF.           If plan is discharge home, recommend the following: A little help with walking and/or transfers;Assistance with cooking/housework;Assist for transportation;Help with stairs or ramp for entrance   Can travel by private vehicle         Equipment Recommendations Other (comment) (Pt declined DME)  Recommendations for Other Services       Functional Status Assessment Patient has had a recent decline in their functional status and demonstrates the ability to make significant improvements in function in a reasonable and predictable amount of time.     Precautions / Restrictions Precautions Precautions: Fall Restrictions Weight Bearing Restrictions Per Provider Order: No      Mobility  Bed Mobility               General bed mobility comments: NT    Transfers Overall transfer level: Independent                 General transfer comment: Good eccentric and concentric control and stability from multiple height surfaces    Ambulation/Gait Ambulation/Gait assistance: Supervision Gait Distance (Feet): 100 Feet Assistive device: None Gait Pattern/deviations: Step-through pattern, Decreased step length - right, Decreased step length - left Gait velocity: decreased     General Gait Details: Mildly reduced cadence with two instances of mild R knee buckling that the pt easily self-corrected; no overt LOB  Stairs            Wheelchair Mobility     Tilt Bed    Modified Rankin (Stroke Patients Only)       Balance Overall balance assessment: Needs assistance   Sitting balance-Leahy Scale: Normal     Standing balance support: No upper extremity supported Standing balance-Leahy Scale: Good   Single Leg Stance - Right Leg: 2 Single Leg Stance - Left Leg: 1  High Level Balance Comments: Good stability with start/stops and 180 deg turns, mild instability with feet together with pt able to self-correct             Pertinent Vitals/Pain Pain Assessment Pain Assessment: No/denies pain    Home Living Family/patient expects to be discharged to:: Private residence Living Arrangements: Spouse/significant other Available Help at Discharge: Family;Available 24 hours/day Type  of Home: House Home Access: Stairs to enter Entrance Stairs-Rails: None Entrance Stairs-Number of Steps: 1 small threshold Alternate Level Stairs-Number of Steps: 14 Home Layout: Two level;Able to live on main level with bedroom/bathroom Home Equipment: None Additional Comments: Spouse available intermittently but father lives close by and available 24/7 as needed    Prior Function Prior Level of Function : Independent/Modified Independent             Mobility Comments: Ind amb without an AD community distances, no fall history ADLs Comments: does not drive due to vision, wife helps w meds, Ind with ADLs     Extremity/Trunk Assessment   Upper Extremity Assessment Upper Extremity Assessment: Defer to OT evaluation    Lower Extremity Assessment Lower Extremity Assessment: RLE deficits/detail;LLE deficits/detail RLE Deficits / Details: RLE strength grossly 4+/5 with exception of 3+/5 hip flex strength RLE Sensation: WNL RLE Coordination: WNL LLE Deficits / Details: LLE strength 5/5 LLE Sensation: WNL LLE Coordination: WNL       Communication   Communication Communication: No apparent difficulties    Cognition Arousal: Alert Behavior During Therapy: WFL for tasks assessed/performed   PT - Cognitive impairments: No apparent impairments                         Following commands: Intact       Cueing Cueing Techniques: Verbal cues     General Comments      Exercises     Assessment/Plan    PT Assessment Patient needs continued PT services  PT Problem List Decreased strength;Decreased balance       PT Treatment Interventions Gait training;Stair training;Functional mobility training;Therapeutic activities;Therapeutic exercise;Balance training;Patient/family education    PT Goals (Current goals can be found in the Care Plan section)  Acute Rehab PT Goals Patient Stated Goal: To return home PT Goal Formulation: With patient Time For Goal  Achievement: 09/26/23 Potential to Achieve Goals: Good    Frequency Min 1X/week     Co-evaluation               AM-PAC PT "6 Clicks" Mobility  Outcome Measure Help needed turning from your back to your side while in a flat bed without using bedrails?: None Help needed moving from lying on your back to sitting on the side of a flat bed without using bedrails?: None Help needed moving to and from a bed to a chair (including a wheelchair)?: None Help needed standing up from a chair using your arms (e.g., wheelchair or bedside chair)?: A Little Help needed to walk in hospital room?: A Little Help needed climbing 3-5 steps with a railing? : A Little 6 Click Score: 21    End of Session Equipment Utilized During Treatment: Gait belt Activity Tolerance: Patient tolerated treatment well Patient left: in chair;with call bell/phone within reach Nurse Communication: Mobility status PT Visit Diagnosis: Unsteadiness on feet (R26.81);Muscle weakness (generalized) (M62.81)    Time: 2440-1027 PT Time Calculation (min) (ACUTE ONLY): 21 min   Charges:   PT Evaluation $PT Eval Moderate Complexity: 1 Mod  PT General Charges $$ ACUTE PT VISIT: 1 Visit    D. Scott Lalania Haseman PT, DPT 09/13/23, 11:02 AM

## 2023-09-13 NOTE — Progress Notes (Signed)
  Received patient in bed to unit.   Informed consent signed and in chart.    TX duration 3:15     Transported back to floor  Hand-off given to patient's nurse. Pt c/o 4/10 headache   no acute distress noted    Access used: L AVF Access issues: none   Total UF removed: 0 Medication(s) given: tylenol  Post HD VS: hypertensive Post HD weight: 89.5kg     Bettye Bruins LPN [facility]

## 2023-09-13 NOTE — Progress Notes (Signed)
 [facility]  ROUNDING NOTE   Subjective:   Bradley Lopez is a 48 year old male with past medical conditions including anemia, hypertension, depression, diabetes, right retinal detachment with nuclear sclerotic cataract and end-stage renal disease on hemodialysis.  Patient presents to the emergency department with strokelike symptoms has been admitted under observation for TIA (transient ischemic attack) [G45.9] Cerebrovascular accident (CVA), unspecified mechanism (HCC) [I63.9]  Patient is known our practice and receives outpatient dialysis treatments at [facility] on a MWF schedule, supervised by Dr. Rhesa Celeste.  Last treatment received on Wednesday.  Patient states he began having symptoms a few hours prior to ED arrival.  Reports facial droop with arm and leg numbness on the right side.  Reports the symptoms began after his scheduled dialysis treatment.  Labs on ED arrival unremarkable for renal patient.  CT head negative for acute findings however brain MRI does show 4 mm acute infarct at thalamus.  We have been consulted to manage dialysis needs.    Objective:  Vital signs in last 24 hours:  Temp:  [97.5 F (36.4 C)-99.1 F (37.3 C)] 98.2 F (36.8 C) (05/23 1437) Pulse Rate:  [66-94] 88 (05/23 1500) Resp:  [11-18] 12 (05/23 1500) BP: (135-188)/(70-100) 151/82 (05/23 1500) SpO2:  [96 %-100 %] 100 % (05/23 1500) Weight:  [89.5 kg] 89.5 kg (05/23 1450)  Weight change:  Filed Weights   09/12/23 0847 09/13/23 1450  Weight: 93 kg 89.5 kg    Intake/Output: No intake/output data recorded.   Intake/Output this shift:  Total I/O In: 360 [P.O.:360] Out: -   Physical Exam: General: NAD, seated in chair  Head: Normocephalic, atraumatic. Moist oral mucosal membranes  Eyes: Anicteric  Lungs:  Clear to auscultation, normal effort  Heart: Regular rate and rhythm  Abdomen:  Soft, nontender  Extremities:  No peripheral edema.  Neurologic: Nonfocal, moving all four  extremities  Skin: No lesions  Access: Lt AVF    Basic Metabolic Panel: Recent Labs  Lab 09/12/23 0848  NA 141  K 4.3  CL 98  CO2 30  GLUCOSE 123*  BUN 33*  CREATININE 8.56*  CALCIUM  9.0    Liver Function Tests: Recent Labs  Lab 09/12/23 0848  AST 15  ALT 15  ALKPHOS 73  BILITOT 0.8  PROT 8.3*  ALBUMIN 4.5   No results for input(s): "LIPASE", "AMYLASE" in the last 168 hours. No results for input(s): "AMMONIA" in the last 168 hours.  CBC: Recent Labs  Lab 09/12/23 0848  WBC 6.6  NEUTROABS 3.6  HGB 12.4*  HCT 38.2*  MCV 93.9  PLT 240    Cardiac Enzymes: No results for input(s): "CKTOTAL", "CKMB", "CKMBINDEX", "TROPONINI" in the last 168 hours.  BNP: Invalid input(s): "POCBNP"  CBG: Recent Labs  Lab 09/12/23 0853 09/12/23 2151 09/13/23 0726 09/13/23 1142  GLUCAP 124* 112* 90 183*    Microbiology: Results for orders placed or performed during the hospital encounter of 09/12/23  MRSA Next Gen by PCR, Nasal     Status: None   Collection Time: 09/12/23 11:10 PM   Specimen: Nasal Mucosa; Nasal Swab  Result Value Ref Range Status   MRSA by PCR Next Gen NOT DETECTED NOT DETECTED Final    Comment: (NOTE) The GeneXpert MRSA Assay (FDA approved for NASAL specimens only), is one component of a comprehensive MRSA colonization surveillance program. It is not intended to diagnose MRSA infection nor to guide or monitor treatment for MRSA infections. Test performance is not FDA approved in patients  less than 39 years old. Performed at [facility], [address] [place], [place] [address]     Coagulation Studies: Recent Labs    09/12/23 0848  LABPROT 13.8  INR 1.0    Urinalysis: No results for input(s): "COLORURINE", "LABSPEC", "PHURINE", "GLUCOSEU", "HGBUR", "BILIRUBINUR", "KETONESUR", "PROTEINUR", "UROBILINOGEN", "NITRITE", "LEUKOCYTESUR" in the last 72 hours.  Invalid input(s): "APPERANCEUR"    Imaging: ECHOCARDIOGRAM  COMPLETE Result Date: 09/13/2023    ECHOCARDIOGRAM REPORT   Patient Name:   Bradley Lopez Date of Exam: 09/13/2023 Medical Rec #:  [id number]     Height:       75.0 in Accession #:    [id number]    Weight:       205.0 lb Date of Birth:  [birth year]     BSA:          2.218 m Patient Age:    47 years      BP:           183/97 mmHg Patient Gender: M             HR:           93 bpm. Exam Location:  [facility] Procedure: 2D Echo, Cardiac Doppler and Color Doppler (Both Spectral and Color            Flow Doppler were utilized during procedure). Indications:     TIA G45.9  History:         Patient has prior history of Echocardiogram examinations. Risk                  Factors:Hypertension.  Sonographer:     Kathaleen Pale Roar Referring Phys:  [id number] Wiliam Harder A THOMAS Diagnosing Phys: Antonette Batters MD IMPRESSIONS  1. Left ventricular ejection fraction, by estimation, is 60 to 65%. The left ventricle has normal function. The left ventricle has no regional wall motion abnormalities. There is mild concentric left ventricular hypertrophy. Left ventricular diastolic parameters are consistent with Grade I diastolic dysfunction (impaired relaxation).  2. Right ventricular systolic function is normal. The right ventricular size is normal.  3. The mitral valve is grossly normal. Trivial mitral valve regurgitation.  4. The aortic valve is normal in structure. Aortic valve regurgitation is not visualized. Aortic valve sclerosis/calcification is present, without any evidence of aortic stenosis. FINDINGS  Left Ventricle: Left ventricular ejection fraction, by estimation, is 60 to 65%. The left ventricle has normal function. The left ventricle has no regional wall motion abnormalities. Strain was performed and the global longitudinal strain is indeterminate. Global longitudinal strain performed but not reported based on interpreter judgement due to suboptimal tracking. The left ventricular internal cavity size was normal in size. There is mild  concentric left ventricular hypertrophy. Left ventricular diastolic parameters are consistent with Grade I diastolic dysfunction (impaired relaxation). Right Ventricle: The right ventricular size is normal. No increase in right ventricular wall thickness. Right ventricular systolic function is normal. Left Atrium: Left atrial size was normal in size. Right Atrium: Right atrial size was normal in size. Pericardium: There is no evidence of pericardial effusion. Mitral Valve: The mitral valve is grossly normal. There is mild thickening of the mitral valve leaflet(s). There is mild calcification of the mitral valve leaflet(s). Normal mobility of the mitral valve leaflets. Trivial mitral valve regurgitation. MV peak gradient, 9.1 mmHg. The mean mitral valve gradient is 4.0 mmHg. Tricuspid Valve: The tricuspid valve is normal in structure. Tricuspid valve regurgitation is mild. Aortic Valve: The aortic valve  is normal in structure. Aortic valve regurgitation is not visualized. Aortic valve sclerosis/calcification is present, without any evidence of aortic stenosis. Aortic valve mean gradient measures 5.0 mmHg. Aortic valve peak  gradient measures 10.1 mmHg. Aortic valve area, by VTI measures 3.03 cm. Pulmonic Valve: The pulmonic valve was normal in structure. Pulmonic valve regurgitation is not visualized. Aorta: The ascending aorta was not well visualized. IAS/Shunts: No atrial level shunt detected by color flow Doppler. Additional Comments: 3D was performed not requiring image post processing on an independent workstation and was indeterminate.  LEFT VENTRICLE PLAX 2D LVIDd:         4.10 cm   Diastology LVIDs:         2.80 cm   LV e' medial:    7.62 cm/s LV PW:         1.30 cm   LV E/e' medial:  12.7 LV IVS:        1.40 cm   LV e' lateral:   10.80 cm/s LVOT diam:     2.00 cm   LV E/e' lateral: 9.0 LV SV:         80 LV SV Index:   36 LVOT Area:     3.14 cm  RIGHT VENTRICLE RV Basal diam:  3.30 cm RV Mid diam:    2.80  cm RV S prime:     14.10 cm/s TAPSE (M-mode): 3.3 cm LEFT ATRIUM             Index        RIGHT ATRIUM           Index LA diam:        3.80 cm 1.71 cm/m   RA Area:     17.70 cm LA Vol (A2C):   61.7 ml 27.82 ml/m  RA Volume:   47.40 ml  21.38 ml/m LA Vol (A4C):   60.1 ml 27.10 ml/m LA Biplane Vol: 63.0 ml 28.41 ml/m  AORTIC VALVE                     PULMONIC VALVE AV Area (Vmax):    2.29 cm      PV Vmax:        1.35 m/s AV Area (Vmean):   2.69 cm      PV Peak grad:   7.3 mmHg AV Area (VTI):     3.03 cm      RVOT Peak grad: 5 mmHg AV Vmax:           159.00 cm/s AV Vmean:          106.000 cm/s AV VTI:            0.263 m AV Peak Grad:      10.1 mmHg AV Mean Grad:      5.0 mmHg LVOT Vmax:         116.00 cm/s LVOT Vmean:        90.600 cm/s LVOT VTI:          0.254 m LVOT/AV VTI ratio: 0.97  AORTA Ao Root diam: 2.60 cm Ao Asc diam:  3.00 cm MITRAL VALVE MV Area (PHT): 7.16 cm     SHUNTS MV Area VTI:   3.32 cm     Systemic VTI:  0.25 m MV Peak grad:  9.1 mmHg     Systemic Diam: 2.00 cm MV Mean grad:  4.0 mmHg MV Vmax:       1.51 m/s MV Vmean:  90.4 cm/s MV Decel Time: 106 msec MV E velocity: 96.90 cm/s MV A velocity: 118.00 cm/s MV E/A ratio:  0.82 MV A Prime:    15.6 cm/s Antonette Batters MD Electronically signed by Antonette Batters MD Signature Date/Time: 09/13/2023/2:44:32 PM    Final    MR BRAIN WO CONTRAST Result Date: 09/12/2023 CLINICAL DATA:  Provided history: Neuro deficit, acute, stroke suspected. EXAM: MRI HEAD WITHOUT CONTRAST TECHNIQUE: Multiplanar, multiecho pulse sequences of the brain and surrounding structures were obtained without intravenous contrast. COMPARISON:  Head CT 09/12/2023. MRI brain and MRA head 01/05/2021. FINDINGS: Brain: No age-advanced or lobar predominant cerebral atrophy. 4 mm acute infarct within the left thalamus. Chronic lacunar infarcts within the posterior limb of right internal capsule and right thalamus, new from the prior MRI of 01/05/2021. Several nonspecific  punctate chronic microhemorrhages scattered within the supratentorial brain, new from the prior MRI. Tiny chronic infarct within the left cerebellar hemisphere, new from the prior MRI (series 10, image 6). No evidence of an intracranial mass. No extra-axial fluid collection. No midline shift. Vascular: Maintained flow voids within the proximal large arterial vessels. Skull and upper cervical spine: No focal worrisome marrow lesion. Sinuses/Orbits: No mass or acute finding within the imaged orbits. Prior bilateral ocular lens replacement. No significant paranasal sinus disease. IMPRESSION: 1. 4 mm acute infarct within the left thalamus. 2. Chronic lacunar infarcts within the posterior limb of right internal capsule and right thalamus, new from the prior MRI of 01/05/2021. 3. Several nonspecific punctate chronic microhemorrhages within the supratentorial brain, new from the prior MRI. 4. Tiny chronic infarct within the left cerebellar hemisphere, also new from the prior MRI. Electronically Signed   By: Bascom Lily D.O.   On: 09/12/2023 16:41   CT HEAD WO CONTRAST Result Date: 09/12/2023 CLINICAL DATA:  Provided history: Neuro deficit, acute, stroke suspected. Right-sided numbness. Weakness. EXAM: CT HEAD WITHOUT CONTRAST TECHNIQUE: Contiguous axial images were obtained from the base of the skull through the vertex without intravenous contrast. RADIATION DOSE REDUCTION: This exam was performed according to the departmental dose-optimization program which includes automated exposure control, adjustment of the mA and/or kV according to patient size and/or use of iterative reconstruction technique. COMPARISON:  Brain MRI 01/05/2021. MRA head 01/05/2021. FINDINGS: Brain: No age-advanced or lobar predominant cerebral atrophy. There is no acute intracranial hemorrhage. No demarcated cortical infarct. No extra-axial fluid collection. No evidence of an intracranial mass. No midline shift. Vascular: No hyperdense vessel.   Atherosclerotic calcifications. Skull: No calvarial fracture or aggressive osseous lesion. Sinuses/Orbits: No mass or acute finding within the imaged orbits. Partial opacification of a posterior left ethmoid air cell. IMPRESSION: 1. No evidence of an acute intracranial abnormality. 2. Mild left ethmoid sinus disease. Electronically Signed   By: Bascom Lily D.O.   On: 09/12/2023 09:59     Medications:     aspirin  EC  81 mg Oral Daily   atorvastatin   40 mg Oral Daily   Chlorhexidine  Gluconate Cloth  6 each Topical Q0600   clopidogrel   75 mg Oral Daily   ferric citrate  210 mg Oral TID WC   insulin  aspart  0-6 Units Subcutaneous TID WC   insulin  glargine-yfgn  16 Units Subcutaneous Daily   multivitamin  1 tablet Oral QHS   sertraline  50 mg Oral Daily   sodium chloride  flush  3 mL Intravenous Once   ursodiol   300 mg Oral TID   acetaminophen  **OR** acetaminophen  (TYLENOL ) oral liquid 160 mg/5 mL **  OR** acetaminophen , albuterol, labetalol , senna-docusate  Assessment/ Plan:  Mr. Bradley Lopez is a 48 y.o.  male with past medical conditions including anemia, hypertension, depression, diabetes, right retinal detachment with nuclear sclerotic cataract and end-stage renal disease.  Patient presents with strokelike symptoms and has been admitted under observation for TIA (transient ischemic attack) [G45.9] Cerebrovascular accident (CVA), unspecified mechanism (HCC) [I63.9]  [facility]/MWF/left aVF  TIA, suspected.  Strokelike symptoms.  Brain MRI shows a 4 mm acute infarct within the left thalamus and evidence of chronic infarcts.Primary team will continue aspirin , Plavix  and Lipitor.  2.  End-stage renal disease on hemodialysis.  Last treatment received on Wednesday.  Will schedule treatment for later today.  3. Anemia of chronic kidney disease Lab Results  Component Value Date   HGB 12.4 (L) 09/12/2023    Hgb stable for this patient.   4. Secondary Hyperparathyroidism: with  outpatient labs: None available  Lab Results  Component Value Date   PTH 54 06/11/2020   CALCIUM  9.0 09/12/2023   CAION 1.14 (L) 01/03/2022   PHOS 5.7 (H) 02/21/2022    Calcium  within optimal range.  Will continue to monitor bone minerals.   LOS: 1 Latosha Gaylord 5/23/20253:33 PM

## 2023-09-13 NOTE — Progress Notes (Signed)
 SLP Cancellation Note  Patient Details Name: Bradley Lopez MRN: [id number] DOB: 1975-10-07   Cancelled treatment:       Reason Eval/Treat Not Completed: SLP screened, no needs identified, will sign off (chart reviewed; consulted NSG and met w/ pt in room.)  Pt admitted to the ED with complaint of right sided numbness affecting his right lower face(min) and right arm and leg, and weaker on right side as well. He endorsed vision changes currently but no speech difficulties, nor difficulty swallowing.  Pt denied any difficulty swallowing and is currently on a regular diet; tolerates swallowing pills w/ water per NSG. Pt conversed in conversation w/out expressive/receptive deficits noted; pt denied any speech-language deficits this morning. No confusion- he answered basic questions re: self and needs/wants this morning. Speech clear, intelligible. He endorsed vision change; noted a magnifying glass on tray table. No acute skilled ST services indicated as pt appears at baseline w/ speech-swallowing abilities. Pt agreed. NSG to reconsult if any change in status while admitted.      Darla Edward, MS, CCC-SLP Speech Language Pathologist Rehab Services; [facility] [phone] (ascom) Seanpaul Preece 09/13/2023, 8:49 AM

## 2023-09-13 NOTE — Progress Notes (Addendum)
 Progress Note    RAYSEAN GRAUMANN  ZOX:[id number] DOB: Aug 08, 1975  DOA: 09/12/2023 PCP: Little Riff, MD      Brief Narrative:    Medical records reviewed and are as summarized below:  Bradley STOGSDILL is a 48 y.o. male with medical history significant for ESRD on hemodialysis on Mondays Wednesdays and Fridays schedule, type II DM, anemia of chronic disease, asthma, hypertension, legal blindness, who presented to the hospital with numbness affecting the right side of his lower face, right arm and right leg and weakness in the right arm and right leg.  MRI brain showed 4 mm acute infarct within the left thalamus. He was admitted to the hospital for acute stroke.       Assessment/Plan:   Principal Problem:   CVA (cerebral vascular accident) (HCC) Active Problems:   ESRD (end stage renal disease) (HCC)   Diabetes mellitus with stage 5 chronic kidney disease GFR <15 (HCC)   Body mass index is 24.66 kg/m.   Acute stroke: MRI brain showed 4 mm acute infarct within the left thalamus.  There is also evidence of chronic lacunar infarcts The right internal capsule and right thalamus and left cerebellar hemisphere.  Several nonspecific punctate chronic microhemorrhages within the supratentorial parenchyma. Patient said he was not aware of any previous stroke or chronic infarcts. 2D echo showed EF estimated at 66 5%, mild concentric LVH, grade 1 diastolic dysfunction. MRI head and neck have been ordered for further evaluation. PT recommended outpatient PT. Continue aspirin , Plavix  and Lipitor Follow-up with neurologist for further recommendations.   ESRD: Consulted Dr. Rhesa Celeste, nephrologist, for hemodialysis today.   Hypertension: BP was held earlier to allow for permissive hypertension.  Resume antihypertensives tomorrow.   Type II DM, insulin -dependent: Continue Lantus and NovoLog . Hemoglobin A1c 6.1.   Hyperlipidemia: Total cholesterol 135, triglycerides 140, HDL  34, LDL 73 Continue Lipitor   Comorbidities include anemia of chronic disease, legal blindness, history of asthma  Diet Order             Diet renal with fluid restriction Fluid restriction: 1200 mL Fluid; Room service appropriate? Yes; Fluid consistency: Thin  Diet effective now                            Consultants: Neurologist Nephrologist  Procedures: None    Medications:    aspirin  EC  81 mg Oral Daily   atorvastatin   40 mg Oral Daily   Chlorhexidine  Gluconate Cloth  6 each Topical Q0600   clopidogrel   75 mg Oral Daily   ferric citrate  210 mg Oral TID WC   insulin  aspart  0-6 Units Subcutaneous TID WC   insulin  glargine-yfgn  16 Units Subcutaneous Daily   multivitamin  1 tablet Oral QHS   sertraline  50 mg Oral Daily   sodium chloride  flush  3 mL Intravenous Once   ursodiol   300 mg Oral TID   Continuous Infusions:   Anti-infectives (From admission, onward)    None              Family Communication/Anticipated D/C date and plan/Code Status   DVT prophylaxis: SCD's Start: 09/12/23 2049     Code Status: Full Code  Family Communication: None Disposition Plan: Plan to discharge home   Status is: Observation The patient will require care spanning > 2 midnights and should be moved to inpatient because: Stroke workup in progress  Subjective:   Interval events noted.  He feels better.  He still has some numbness on the right side of his cheek and lip.  Weakness in the right upper and lower extremities have improved.  No changes in speech or vision.  He is legally blind at baseline.  Objective:    Vitals:   09/13/23 1437 09/13/23 1450 09/13/23 1451 09/13/23 1500  BP: (!) 180/91  (!) 188/93 (!) 151/82  Pulse: 93  66 88  Resp: 14  14 12   Temp: 98.2 F (36.8 C)     TempSrc: Oral     SpO2: 100%  100% 100%  Weight:  89.5 kg    Height:       No data found.   Intake/Output Summary (Last 24 hours) at 09/13/2023  1515 Last data filed at 09/13/2023 1357 Gross per 24 hour  Intake 360 ml  Output --  Net 360 ml   Filed Weights   09/12/23 0847 09/13/23 1450  Weight: 93 kg 89.5 kg    Exam:  GEN: NAD SKIN: Warm and dry EYES: No pallor or icterus.  EOMI, PERRLA ENT: MMM CV: RRR PULM: CTA B ABD: soft, ND, NT, +BS CNS: AAO x 3, non focal EXT: No edema or tenderness        Data Reviewed:   I have personally reviewed following labs and imaging studies:  Labs: Labs show the following:   Basic Metabolic Panel: Recent Labs  Lab 09/12/23 0848  NA 141  K 4.3  CL 98  CO2 30  GLUCOSE 123*  BUN 33*  CREATININE 8.56*  CALCIUM  9.0   GFR Estimated Creatinine Clearance: 12.9 mL/min (A) (by C-G formula based on SCr of 8.56 mg/dL (H)). Liver Function Tests: Recent Labs  Lab 09/12/23 0848  AST 15  ALT 15  ALKPHOS 73  BILITOT 0.8  PROT 8.3*  ALBUMIN 4.5   No results for input(s): "LIPASE", "AMYLASE" in the last 168 hours. No results for input(s): "AMMONIA" in the last 168 hours. Coagulation profile Recent Labs  Lab 09/12/23 0848  INR 1.0    CBC: Recent Labs  Lab 09/12/23 0848  WBC 6.6  NEUTROABS 3.6  HGB 12.4*  HCT 38.2*  MCV 93.9  PLT 240   Cardiac Enzymes: No results for input(s): "CKTOTAL", "CKMB", "CKMBINDEX", "TROPONINI" in the last 168 hours. BNP (last 3 results) No results for input(s): "PROBNP" in the last 8760 hours. CBG: Recent Labs  Lab 09/12/23 0853 09/12/23 2151 09/13/23 0726 09/13/23 1142  GLUCAP 124* 112* 90 183*   D-Dimer: No results for input(s): "DDIMER" in the last 72 hours. Hgb A1c: Recent Labs    09/12/23 0848  HGBA1C 6.1*   Lipid Profile: Recent Labs    09/13/23 0325  CHOL 135  HDL 34*  LDLCALC 73  TRIG 130  CHOLHDL 4.0   Thyroid  function studies: No results for input(s): "TSH", "T4TOTAL", "T3FREE", "THYROIDAB" in the last 72 hours.  Invalid input(s): "FREET3" Anemia work up: No results for input(s): "VITAMINB12",  "FOLATE", "FERRITIN", "TIBC", "IRON ", "RETICCTPCT" in the last 72 hours. Sepsis Labs: Recent Labs  Lab 09/12/23 0848  WBC 6.6    Microbiology Recent Results (from the past 240 hours)  MRSA Next Gen by PCR, Nasal     Status: None   Collection Time: 09/12/23 11:10 PM   Specimen: Nasal Mucosa; Nasal Swab  Result Value Ref Range Status   MRSA by PCR Next Gen NOT DETECTED NOT DETECTED Final    Comment: (NOTE) The  GeneXpert MRSA Assay (FDA approved for NASAL specimens only), is one component of a comprehensive MRSA colonization surveillance program. It is not intended to diagnose MRSA infection nor to guide or monitor treatment for MRSA infections. Test performance is not FDA approved in patients less than 52 years old. Performed at [facility], [address] [place], [place] [address]     Procedures and diagnostic studies:  ECHOCARDIOGRAM COMPLETE Result Date: 09/13/2023    ECHOCARDIOGRAM REPORT   Patient Name:   ROBLEY MATASSA Date of Exam: 09/13/2023 Medical Rec #:  [id number]     Height:       75.0 in Accession #:    [id number]    Weight:       205.0 lb Date of Birth:  10-15-1975     BSA:          2.218 m Patient Age:    47 years      BP:           183/97 mmHg Patient Gender: M             HR:           93 bpm. Exam Location:  [facility] Procedure: 2D Echo, Cardiac Doppler and Color Doppler (Both Spectral and Color            Flow Doppler were utilized during procedure). Indications:     TIA G45.9  History:         Patient has prior history of Echocardiogram examinations. Risk                  Factors:Hypertension.  Sonographer:     Kathaleen Pale Roar Referring Phys:  [id number] Wiliam Harder A THOMAS Diagnosing Phys: Antonette Batters MD IMPRESSIONS  1. Left ventricular ejection fraction, by estimation, is 60 to 65%. The left ventricle has normal function. The left ventricle has no regional wall motion abnormalities. There is mild concentric left ventricular hypertrophy. Left ventricular diastolic  parameters are consistent with Grade I diastolic dysfunction (impaired relaxation).  2. Right ventricular systolic function is normal. The right ventricular size is normal.  3. The mitral valve is grossly normal. Trivial mitral valve regurgitation.  4. The aortic valve is normal in structure. Aortic valve regurgitation is not visualized. Aortic valve sclerosis/calcification is present, without any evidence of aortic stenosis. FINDINGS  Left Ventricle: Left ventricular ejection fraction, by estimation, is 60 to 65%. The left ventricle has normal function. The left ventricle has no regional wall motion abnormalities. Strain was performed and the global longitudinal strain is indeterminate. Global longitudinal strain performed but not reported based on interpreter judgement due to suboptimal tracking. The left ventricular internal cavity size was normal in size. There is mild concentric left ventricular hypertrophy. Left ventricular diastolic parameters are consistent with Grade I diastolic dysfunction (impaired relaxation). Right Ventricle: The right ventricular size is normal. No increase in right ventricular wall thickness. Right ventricular systolic function is normal. Left Atrium: Left atrial size was normal in size. Right Atrium: Right atrial size was normal in size. Pericardium: There is no evidence of pericardial effusion. Mitral Valve: The mitral valve is grossly normal. There is mild thickening of the mitral valve leaflet(s). There is mild calcification of the mitral valve leaflet(s). Normal mobility of the mitral valve leaflets. Trivial mitral valve regurgitation. MV peak gradient, 9.1 mmHg. The mean mitral valve gradient is 4.0 mmHg. Tricuspid Valve: The tricuspid valve is normal in structure. Tricuspid valve regurgitation is mild. Aortic Valve: The aortic  valve is normal in structure. Aortic valve regurgitation is not visualized. Aortic valve sclerosis/calcification is present, without any evidence of  aortic stenosis. Aortic valve mean gradient measures 5.0 mmHg. Aortic valve peak  gradient measures 10.1 mmHg. Aortic valve area, by VTI measures 3.03 cm. Pulmonic Valve: The pulmonic valve was normal in structure. Pulmonic valve regurgitation is not visualized. Aorta: The ascending aorta was not well visualized. IAS/Shunts: No atrial level shunt detected by color flow Doppler. Additional Comments: 3D was performed not requiring image post processing on an independent workstation and was indeterminate.  LEFT VENTRICLE PLAX 2D LVIDd:         4.10 cm   Diastology LVIDs:         2.80 cm   LV e' medial:    7.62 cm/s LV PW:         1.30 cm   LV E/e' medial:  12.7 LV IVS:        1.40 cm   LV e' lateral:   10.80 cm/s LVOT diam:     2.00 cm   LV E/e' lateral: 9.0 LV SV:         80 LV SV Index:   36 LVOT Area:     3.14 cm  RIGHT VENTRICLE RV Basal diam:  3.30 cm RV Mid diam:    2.80 cm RV S prime:     14.10 cm/s TAPSE (M-mode): 3.3 cm LEFT ATRIUM             Index        RIGHT ATRIUM           Index LA diam:        3.80 cm 1.71 cm/m   RA Area:     17.70 cm LA Vol (A2C):   61.7 ml 27.82 ml/m  RA Volume:   47.40 ml  21.38 ml/m LA Vol (A4C):   60.1 ml 27.10 ml/m LA Biplane Vol: 63.0 ml 28.41 ml/m  AORTIC VALVE                     PULMONIC VALVE AV Area (Vmax):    2.29 cm      PV Vmax:        1.35 m/s AV Area (Vmean):   2.69 cm      PV Peak grad:   7.3 mmHg AV Area (VTI):     3.03 cm      RVOT Peak grad: 5 mmHg AV Vmax:           159.00 cm/s AV Vmean:          106.000 cm/s AV VTI:            0.263 m AV Peak Grad:      10.1 mmHg AV Mean Grad:      5.0 mmHg LVOT Vmax:         116.00 cm/s LVOT Vmean:        90.600 cm/s LVOT VTI:          0.254 m LVOT/AV VTI ratio: 0.97  AORTA Ao Root diam: 2.60 cm Ao Asc diam:  3.00 cm MITRAL VALVE MV Area (PHT): 7.16 cm     SHUNTS MV Area VTI:   3.32 cm     Systemic VTI:  0.25 m MV Peak grad:  9.1 mmHg     Systemic Diam: 2.00 cm MV Mean grad:  4.0 mmHg MV Vmax:       1.51 m/s MV  Vmean:  90.4 cm/s MV Decel Time: 106 msec MV E velocity: 96.90 cm/s MV A velocity: 118.00 cm/s MV E/A ratio:  0.82 MV A Prime:    15.6 cm/s Antonette Batters MD Electronically signed by Antonette Batters MD Signature Date/Time: 09/13/2023/2:44:32 PM    Final    MR BRAIN WO CONTRAST Result Date: 09/12/2023 CLINICAL DATA:  Provided history: Neuro deficit, acute, stroke suspected. EXAM: MRI HEAD WITHOUT CONTRAST TECHNIQUE: Multiplanar, multiecho pulse sequences of the brain and surrounding structures were obtained without intravenous contrast. COMPARISON:  Head CT 09/12/2023. MRI brain and MRA head 01/05/2021. FINDINGS: Brain: No age-advanced or lobar predominant cerebral atrophy. 4 mm acute infarct within the left thalamus. Chronic lacunar infarcts within the posterior limb of right internal capsule and right thalamus, new from the prior MRI of 01/05/2021. Several nonspecific punctate chronic microhemorrhages scattered within the supratentorial brain, new from the prior MRI. Tiny chronic infarct within the left cerebellar hemisphere, new from the prior MRI (series 10, image 6). No evidence of an intracranial mass. No extra-axial fluid collection. No midline shift. Vascular: Maintained flow voids within the proximal large arterial vessels. Skull and upper cervical spine: No focal worrisome marrow lesion. Sinuses/Orbits: No mass or acute finding within the imaged orbits. Prior bilateral ocular lens replacement. No significant paranasal sinus disease. IMPRESSION: 1. 4 mm acute infarct within the left thalamus. 2. Chronic lacunar infarcts within the posterior limb of right internal capsule and right thalamus, new from the prior MRI of 01/05/2021. 3. Several nonspecific punctate chronic microhemorrhages within the supratentorial brain, new from the prior MRI. 4. Tiny chronic infarct within the left cerebellar hemisphere, also new from the prior MRI. Electronically Signed   By: Bascom Lily D.O.   On: 09/12/2023 16:41    CT HEAD WO CONTRAST Result Date: 09/12/2023 CLINICAL DATA:  Provided history: Neuro deficit, acute, stroke suspected. Right-sided numbness. Weakness. EXAM: CT HEAD WITHOUT CONTRAST TECHNIQUE: Contiguous axial images were obtained from the base of the skull through the vertex without intravenous contrast. RADIATION DOSE REDUCTION: This exam was performed according to the departmental dose-optimization program which includes automated exposure control, adjustment of the mA and/or kV according to patient size and/or use of iterative reconstruction technique. COMPARISON:  Brain MRI 01/05/2021. MRA head 01/05/2021. FINDINGS: Brain: No age-advanced or lobar predominant cerebral atrophy. There is no acute intracranial hemorrhage. No demarcated cortical infarct. No extra-axial fluid collection. No evidence of an intracranial mass. No midline shift. Vascular: No hyperdense vessel.  Atherosclerotic calcifications. Skull: No calvarial fracture or aggressive osseous lesion. Sinuses/Orbits: No mass or acute finding within the imaged orbits. Partial opacification of a posterior left ethmoid air cell. IMPRESSION: 1. No evidence of an acute intracranial abnormality. 2. Mild left ethmoid sinus disease. Electronically Signed   By: Bascom Lily D.O.   On: 09/12/2023 09:59               LOS: 1 day   Beacher Every  [facility]   Pager on [email or web address]. If 7PM-7AM, please contact night-coverage at [email or web address]     09/13/2023, 3:15 PM

## 2023-09-14 DIAGNOSIS — I639 Cerebral infarction, unspecified: Secondary | ICD-10-CM | POA: Diagnosis not present

## 2023-09-14 DIAGNOSIS — I6381 Other cerebral infarction due to occlusion or stenosis of small artery: Secondary | ICD-10-CM | POA: Diagnosis not present

## 2023-09-14 LAB — HEPATITIS B SURFACE ANTIBODY, QUANTITATIVE: Hep B S AB Quant (Post): 46.3 m[IU]/mL

## 2023-09-14 LAB — GLUCOSE, CAPILLARY
Glucose-Capillary: 99 mg/dL (ref 70–99)
Glucose-Capillary: 155 mg/dL — ABNORMAL HIGH (ref 70–99)

## 2023-09-14 LAB — MISC LABCORP TEST (SEND OUT): Labcorp test code: 6510

## 2023-09-14 LAB — PHOSPHORUS: Phosphorus: 5.1 mg/dL — ABNORMAL HIGH (ref 2.5–4.6)

## 2023-09-14 MED ORDER — CLOPIDOGREL BISULFATE 75 MG PO TABS: 75.0000 mg | ORAL_TABLET | Freq: Every day | ORAL | 0 refills | Status: AC

## 2023-09-14 MED ORDER — ASPIRIN 81 MG PO TBEC: 81.0000 mg | DELAYED_RELEASE_TABLET | Freq: Every day | ORAL | Status: AC

## 2023-09-14 MED ORDER — LISINOPRIL 20 MG PO TABS
20.0000 mg | ORAL_TABLET | Freq: Every day | ORAL | Status: DC
  Administered 2023-09-14: 20 mg via ORAL
  Filled 2023-09-14: qty 1

## 2023-09-14 MED ORDER — METOLAZONE 5 MG PO TABS: ORAL_TABLET | ORAL | Status: AC

## 2023-09-14 MED ORDER — CLONIDINE HCL 0.1 MG PO TABS
0.3000 mg | ORAL_TABLET | Freq: Two times a day (BID) | ORAL | Status: DC
Start: 2023-09-14 — End: 2023-09-14
  Administered 2023-09-14: 0.3 mg via ORAL
  Filled 2023-09-14: qty 3

## 2023-09-14 MED ORDER — METOPROLOL SUCCINATE ER 100 MG PO TB24
100.0000 mg | ORAL_TABLET | Freq: Every day | ORAL | Status: DC
  Administered 2023-09-14: 100 mg via ORAL
  Filled 2023-09-14: qty 1

## 2023-09-14 NOTE — Plan of Care (Signed)
 Neurology plan of care  Bradley Lopez is a 48 y.o. male with medical history significant for ESRD on hemodialysis on Mondays Wednesdays and Fridays schedule, type II DM, anemia of chronic disease, asthma, hypertension, legal blindness, who presented to the hospital with numbness affecting the right side of his lower face, right arm and right leg and weakness in the right arm and right leg. MRI brain showed acute infarct in the L thalamus as well as e/o chronic lacunar infarcts.   Inpatient workup completed, results as follows:  MRA H&N No hemodynamically-significant stenoses  TTE No intracardiac clot or other sig abnl to explain stroke  Stroke Labs     Component Value Date/Time   CHOL 135 09/13/2023 0325   TRIG 140 09/13/2023 0325   HDL 34 (L) 09/13/2023 0325   CHOLHDL 4.0 09/13/2023 0325   VLDL 28 09/13/2023 0325   LDLCALC 73 09/13/2023 0325   LDLDIRECT 150.9 (H) 01/05/2021 1624    Lab Results  Component Value Date/Time   HGBA1C 6.1 (H) 09/12/2023 08:48 AM   A/P: 48 yo male with hx ESRD on HD M/W/F and multiple cerebrovascular risk factors including HTN, DM2, HL and multiple prior lacunar infarcts p/w R sided numbness and mild weakness and was found to have a 4mm acute infarct in L thalamus. Etiology of stroke is small vessel disease. Controlling his diabetes and hypertension will be critical for secondary stroke prevention. His A1c is currently 6.1 which indicates good current glycemic control. - ASA 81mg  daily + plavix  75mg  daily x21 days f/b plavix  75mg  daily monotherapy after that - Continue atorvastatin  40mg  daily - No further inpatient neurologic workup indicated - I will refer to outpatient neurology  Neurology will be available prn for questions.  Greg Leaks, MD [facility] [phone]  If 7pm- 7am, please page neurology on call as listed in AMION.\

## 2023-09-14 NOTE — Progress Notes (Signed)
 [facility]  ROUNDING NOTE   Subjective:  Bradley Lopez is a 48 year old male with past medical conditions including anemia, hypertension, depression, diabetes, right retinal detachment with nuclear sclerotic cataract and end-stage renal disease on hemodialysis.  Patient presents to the emergency department with strokelike symptoms has been admitted under observation for TIA (transient ischemic attack) [G45.9] Cerebrovascular accident (CVA), unspecified mechanism (HCC) [I63.9]   Patient is known our practice and receives outpatient dialysis treatments at [facility] on a MWF schedule, supervised by Dr. Rhesa Celeste.    Patient presents sitting at the side of the bed talking on the phone, on room air. Patient reports continued trace numbness on right lower face. Endorses chronic right hand and right leg numbness as since he has vascular history as well. Appetite good, no acute complaints.   Objective:  Vital signs in last 24 hours:  Temp:  [97.5 F (36.4 C)-98.7 F (37.1 C)] 97.8 F (36.6 C) (05/24 1117) Pulse Rate:  [66-93] 66 (05/24 1117) Resp:  [11-19] 18 (05/24 1117) BP: (126-200)/(75-105) 126/81 (05/24 1117) SpO2:  [100 %] 100 % (05/24 1117) Weight:  [89.5 kg] 89.5 kg (05/23 1817)  Weight change: -3.487 kg Filed Weights   09/12/23 0847 09/13/23 1450 09/13/23 1817  Weight: 93 kg 89.5 kg 89.5 kg    Intake/Output: I/O last 3 completed shifts: In: 360 [P.O.:360] Out: 0    Intake/Output this shift:  No intake/output data recorded.  Physical Exam: General: NAD,   Head: Normocephalic, atraumatic. Moist oral mucosal membranes  Eyes: Anicteric, PERRL  Neck: Supple, trachea midline  Lungs:  Clear to auscultation  Heart: Regular rate and rhythm  Abdomen:  Soft, nontender,   Extremities:  No peripheral edema.  Neurologic: Nonfocal, moving all four extremities  Skin: No lesions  Access: Left AVF    Basic Metabolic Panel: Recent Labs  Lab 09/12/23 0848  NA  141  K 4.3  CL 98  CO2 30  GLUCOSE 123*  BUN 33*  CREATININE 8.56*  CALCIUM  9.0    Liver Function Tests: Recent Labs  Lab 09/12/23 0848  AST 15  ALT 15  ALKPHOS 73  BILITOT 0.8  PROT 8.3*  ALBUMIN 4.5   No results for input(s): "LIPASE", "AMYLASE" in the last 168 hours. No results for input(s): "AMMONIA" in the last 168 hours.  CBC: Recent Labs  Lab 09/12/23 0848  WBC 6.6  NEUTROABS 3.6  HGB 12.4*  HCT 38.2*  MCV 93.9  PLT 240    Cardiac Enzymes: No results for input(s): "CKTOTAL", "CKMB", "CKMBINDEX", "TROPONINI" in the last 168 hours.  BNP: Invalid input(s): "POCBNP"  CBG: Recent Labs  Lab 09/13/23 1142 09/13/23 1920 09/13/23 2124 09/14/23 0725 09/14/23 1118  GLUCAP 183* 75 162* 99 155*    Microbiology: Results for orders placed or performed during the hospital encounter of 09/12/23  MRSA Next Gen by PCR, Nasal     Status: None   Collection Time: 09/12/23 11:10 PM   Specimen: Nasal Mucosa; Nasal Swab  Result Value Ref Range Status   MRSA by PCR Next Gen NOT DETECTED NOT DETECTED Final    Comment: (NOTE) The GeneXpert MRSA Assay (FDA approved for NASAL specimens only), is one component of a comprehensive MRSA colonization surveillance program. It is not intended to diagnose MRSA infection nor to guide or monitor treatment for MRSA infections. Test performance is not FDA approved in patients less than 31 years old. Performed at [facility], [address] [place], [place] [address]  Coagulation Studies: Recent Labs    09/12/23 0848  LABPROT 13.8  INR 1.0    Urinalysis: No results for input(s): "COLORURINE", "LABSPEC", "PHURINE", "GLUCOSEU", "HGBUR", "BILIRUBINUR", "KETONESUR", "PROTEINUR", "UROBILINOGEN", "NITRITE", "LEUKOCYTESUR" in the last 72 hours.  Invalid input(s): "APPERANCEUR"    Imaging: MR ANGIO HEAD WO CONTRAST Result Date: 09/14/2023 CLINICAL DATA:  Neuro deficit, acute, stroke suspected EXAM: MRA NECK  WITHOUT CONTRAST MRA HEAD WITHOUT CONTRAST TECHNIQUE: Angiographic images of the Circle of Willis were acquired using MRA technique without intravenous contrast. COMPARISON:  None Available. FINDINGS: MRA NECK FINDINGS Aortic arch: Great vessel origins are patent. Right carotid system: Patent without greater than 50% stenosis. Left carotid system: Patent without greater than 50% stenosis. Vertebral arteries: Both vertebral arteries are patent without evidence of greater than 50% stenosis. MRA HEAD FINDINGS Anterior circulation: Bilateral intracranial ICAs, MCAs, and ACAs are patent without proximal hemodynamically significant stenosis. No aneurysm identified. Posterior circulation: Bilateral intradural vertebral arteries, basilar artery and bilateral posterior cerebral arteries are patent without proximal hemodynamically significant stenosis. Fetal type PCAs, anatomic variant. IMPRESSION: No large vessel occlusion or proximal hemodynamically significant stenosis. Electronically Signed   By: Stevenson Elbe M.D.   On: 09/14/2023 00:23   MR ANGIO NECK WO CONTRAST Result Date: 09/14/2023 CLINICAL DATA:  Neuro deficit, acute, stroke suspected EXAM: MRA NECK WITHOUT CONTRAST MRA HEAD WITHOUT CONTRAST TECHNIQUE: Angiographic images of the Circle of Willis were acquired using MRA technique without intravenous contrast. COMPARISON:  None Available. FINDINGS: MRA NECK FINDINGS Aortic arch: Great vessel origins are patent. Right carotid system: Patent without greater than 50% stenosis. Left carotid system: Patent without greater than 50% stenosis. Vertebral arteries: Both vertebral arteries are patent without evidence of greater than 50% stenosis. MRA HEAD FINDINGS Anterior circulation: Bilateral intracranial ICAs, MCAs, and ACAs are patent without proximal hemodynamically significant stenosis. No aneurysm identified. Posterior circulation: Bilateral intradural vertebral arteries, basilar artery and bilateral posterior  cerebral arteries are patent without proximal hemodynamically significant stenosis. Fetal type PCAs, anatomic variant. IMPRESSION: No large vessel occlusion or proximal hemodynamically significant stenosis. Electronically Signed   By: Stevenson Elbe M.D.   On: 09/14/2023 00:23   ECHOCARDIOGRAM COMPLETE Result Date: 09/13/2023    ECHOCARDIOGRAM REPORT   Patient Name:   JOHNATON SONNEBORN Date of Exam: 09/13/2023 Medical Rec #:  [id number]     Height:       75.0 in Accession #:    [id number]    Weight:       205.0 lb Date of Birth:  1975/11/05     BSA:          2.218 m Patient Age:    47 years      BP:           183/97 mmHg Patient Gender: M             HR:           93 bpm. Exam Location:  [facility] Procedure: 2D Echo, Cardiac Doppler and Color Doppler (Both Spectral and Color            Flow Doppler were utilized during procedure). Indications:     TIA G45.9  History:         Patient has prior history of Echocardiogram examinations. Risk                  Factors:Hypertension.  Sonographer:     Kathaleen Pale Roar Referring Phys:  [id number] Wiliam Harder A THOMAS Diagnosing Phys: Antonette Batters MD IMPRESSIONS  1. Left ventricular ejection fraction, by estimation, is 60 to 65%. The left ventricle has normal function. The left ventricle has no regional wall motion abnormalities. There is mild concentric left ventricular hypertrophy. Left ventricular diastolic parameters are consistent with Grade I diastolic dysfunction (impaired relaxation).  2. Right ventricular systolic function is normal. The right ventricular size is normal.  3. The mitral valve is grossly normal. Trivial mitral valve regurgitation.  4. The aortic valve is normal in structure. Aortic valve regurgitation is not visualized. Aortic valve sclerosis/calcification is present, without any evidence of aortic stenosis. FINDINGS  Left Ventricle: Left ventricular ejection fraction, by estimation, is 60 to 65%. The left ventricle has normal function. The left ventricle has no  regional wall motion abnormalities. Strain was performed and the global longitudinal strain is indeterminate. Global longitudinal strain performed but not reported based on interpreter judgement due to suboptimal tracking. The left ventricular internal cavity size was normal in size. There is mild concentric left ventricular hypertrophy. Left ventricular diastolic parameters are consistent with Grade I diastolic dysfunction (impaired relaxation). Right Ventricle: The right ventricular size is normal. No increase in right ventricular wall thickness. Right ventricular systolic function is normal. Left Atrium: Left atrial size was normal in size. Right Atrium: Right atrial size was normal in size. Pericardium: There is no evidence of pericardial effusion. Mitral Valve: The mitral valve is grossly normal. There is mild thickening of the mitral valve leaflet(s). There is mild calcification of the mitral valve leaflet(s). Normal mobility of the mitral valve leaflets. Trivial mitral valve regurgitation. MV peak gradient, 9.1 mmHg. The mean mitral valve gradient is 4.0 mmHg. Tricuspid Valve: The tricuspid valve is normal in structure. Tricuspid valve regurgitation is mild. Aortic Valve: The aortic valve is normal in structure. Aortic valve regurgitation is not visualized. Aortic valve sclerosis/calcification is present, without any evidence of aortic stenosis. Aortic valve mean gradient measures 5.0 mmHg. Aortic valve peak  gradient measures 10.1 mmHg. Aortic valve area, by VTI measures 3.03 cm. Pulmonic Valve: The pulmonic valve was normal in structure. Pulmonic valve regurgitation is not visualized. Aorta: The ascending aorta was not well visualized. IAS/Shunts: No atrial level shunt detected by color flow Doppler. Additional Comments: 3D was performed not requiring image post processing on an independent workstation and was indeterminate.  LEFT VENTRICLE PLAX 2D LVIDd:         4.10 cm   Diastology LVIDs:         2.80 cm    LV e' medial:    7.62 cm/s LV PW:         1.30 cm   LV E/e' medial:  12.7 LV IVS:        1.40 cm   LV e' lateral:   10.80 cm/s LVOT diam:     2.00 cm   LV E/e' lateral: 9.0 LV SV:         80 LV SV Index:   36 LVOT Area:     3.14 cm  RIGHT VENTRICLE RV Basal diam:  3.30 cm RV Mid diam:    2.80 cm RV S prime:     14.10 cm/s TAPSE (M-mode): 3.3 cm LEFT ATRIUM             Index        RIGHT ATRIUM           Index LA diam:        3.80 cm 1.71 cm/m   RA Area:     17.70 cm  LA Vol (A2C):   61.7 ml 27.82 ml/m  RA Volume:   47.40 ml  21.38 ml/m LA Vol (A4C):   60.1 ml 27.10 ml/m LA Biplane Vol: 63.0 ml 28.41 ml/m  AORTIC VALVE                     PULMONIC VALVE AV Area (Vmax):    2.29 cm      PV Vmax:        1.35 m/s AV Area (Vmean):   2.69 cm      PV Peak grad:   7.3 mmHg AV Area (VTI):     3.03 cm      RVOT Peak grad: 5 mmHg AV Vmax:           159.00 cm/s AV Vmean:          106.000 cm/s AV VTI:            0.263 m AV Peak Grad:      10.1 mmHg AV Mean Grad:      5.0 mmHg LVOT Vmax:         116.00 cm/s LVOT Vmean:        90.600 cm/s LVOT VTI:          0.254 m LVOT/AV VTI ratio: 0.97  AORTA Ao Root diam: 2.60 cm Ao Asc diam:  3.00 cm MITRAL VALVE MV Area (PHT): 7.16 cm     SHUNTS MV Area VTI:   3.32 cm     Systemic VTI:  0.25 m MV Peak grad:  9.1 mmHg     Systemic Diam: 2.00 cm MV Mean grad:  4.0 mmHg MV Vmax:       1.51 m/s MV Vmean:      90.4 cm/s MV Decel Time: 106 msec MV E velocity: 96.90 cm/s MV A velocity: 118.00 cm/s MV E/A ratio:  0.82 MV A Prime:    15.6 cm/s Antonette Batters MD Electronically signed by Antonette Batters MD Signature Date/Time: 09/13/2023/2:44:32 PM    Final    MR BRAIN WO CONTRAST Result Date: 09/12/2023 CLINICAL DATA:  Provided history: Neuro deficit, acute, stroke suspected. EXAM: MRI HEAD WITHOUT CONTRAST TECHNIQUE: Multiplanar, multiecho pulse sequences of the brain and surrounding structures were obtained without intravenous contrast. COMPARISON:  Head CT 09/12/2023. MRI brain  and MRA head 01/05/2021. FINDINGS: Brain: No age-advanced or lobar predominant cerebral atrophy. 4 mm acute infarct within the left thalamus. Chronic lacunar infarcts within the posterior limb of right internal capsule and right thalamus, new from the prior MRI of 01/05/2021. Several nonspecific punctate chronic microhemorrhages scattered within the supratentorial brain, new from the prior MRI. Tiny chronic infarct within the left cerebellar hemisphere, new from the prior MRI (series 10, image 6). No evidence of an intracranial mass. No extra-axial fluid collection. No midline shift. Vascular: Maintained flow voids within the proximal large arterial vessels. Skull and upper cervical spine: No focal worrisome marrow lesion. Sinuses/Orbits: No mass or acute finding within the imaged orbits. Prior bilateral ocular lens replacement. No significant paranasal sinus disease. IMPRESSION: 1. 4 mm acute infarct within the left thalamus. 2. Chronic lacunar infarcts within the posterior limb of right internal capsule and right thalamus, new from the prior MRI of 01/05/2021. 3. Several nonspecific punctate chronic microhemorrhages within the supratentorial brain, new from the prior MRI. 4. Tiny chronic infarct within the left cerebellar hemisphere, also new from the prior MRI. Electronically Signed   By: Bascom Lily D.O.  On: 09/12/2023 16:41     Medications:     aspirin  EC  81 mg Oral Daily   atorvastatin   40 mg Oral Daily   Chlorhexidine  Gluconate Cloth  6 each Topical Q0600   cloNIDine   0.3 mg Oral BID   clopidogrel   75 mg Oral Daily   ferric citrate  210 mg Oral TID WC   insulin  aspart  0-6 Units Subcutaneous TID WC   insulin  glargine-yfgn  16 Units Subcutaneous Daily   lisinopril   20 mg Oral Daily   metoprolol  succinate  100 mg Oral Daily   multivitamin  1 tablet Oral QHS   sertraline  50 mg Oral Daily   sodium chloride  flush  3 mL Intravenous Once   ursodiol   300 mg Oral TID   acetaminophen  **OR**  acetaminophen  (TYLENOL ) oral liquid 160 mg/5 mL **OR** acetaminophen , albuterol, heparin , labetalol , lidocaine -prilocaine , pentafluoroprop-tetrafluoroeth, senna-docusate  Assessment/ Plan:  Mr. BRADLY SANGIOVANNI is a 48 y.o.  male  with past medical conditions including anemia, hypertension, depression, diabetes, right retinal detachment with nuclear sclerotic cataract and end-stage renal disease.  Patient presents with strokelike symptoms and has been admitted under observation for TIA (transient ischemic attack) [G45.9] Cerebrovascular accident (CVA), unspecified mechanism (HCC) [I63.9]   [facility]/MWF/left aVF   TIA, suspected.  Strokelike symptoms.  Brain MRI shows a 4 mm acute infarct within the left thalamus and evidence of chronic infarcts.Primary team will continue aspirin , Plavix  and Lipitor.   2.  End-stage renal disease on hemodialysis.  Last treatment received yesterday. Next dialysis for Monday if patient still here.   3. Anemia of chronic kidney disease Recent Labs       Lab Results  Component Value Date    HGB 12.4 (L) 09/12/2023      Hgb stable for this patient.    4. Secondary Hyperparathyroidism: with outpatient labs: 08/23/2023 Phos 5.8, Ca 8.7. 07/01/2023 PTH 488   Recent Labs       Lab Results  Component Value Date    PTH 54 06/11/2020    CALCIUM  9.0 09/12/2023    CAION 1.14 (L) 01/03/2022    PHOS 5.7 (H) 02/21/2022      Calcium  within optimal range.  On auryxia at home. Will continue to monitor bone minerals.    LOS: 1 Tallyn Holroyd Marisa Sickles 5/24/20251:07 PM

## 2023-09-14 NOTE — Care Management Obs Status (Signed)
 MEDICARE OBSERVATION STATUS NOTIFICATION   Patient Details  Name: ORLIE CUNDARI MRN: [id number] Date of Birth: 17-May-1975   Medicare Observation Status Notification Given:       Anise Kerns 09/14/2023, 12:18 PM

## 2023-09-14 NOTE — Discharge Summary (Signed)
 Physician Discharge Summary   Patient: Bradley Lopez MRN: [id number] DOB: 09-27-1975  Admit date:     09/12/2023  Discharge date: 09/14/23  Discharge Physician: Sheril Dines   PCP: Little Riff, MD   Recommendations at discharge:   Follow-up with Dr. 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 *  Hospital Course:  Bradley Lopez is a 48 y.o. male with medical history significant for ESRD on hemodialysis on Mondays Wednesdays and Fridays schedule, type II DM, anemia of chronic disease, asthma, hypertension, legal blindness, who presented to the hospital with numbness affecting the right side of his lower face, right arm and right leg and weakness in the right arm and right leg.   MRI brain showed 4 mm acute infarct within the left thalamus. He was admitted to the hospital for acute stroke.   Assessment and Plan:  Acute stroke: MRI brain showed 4 mm acute infarct within the left thalamus.  There is also evidence of chronic lacunar infarcts The right internal capsule and right thalamus and left cerebellar hemisphere.  Several nonspecific punctate chronic microhemorrhages within the supratentorial parenchyma. Patient said he was not aware of any previous stroke or chronic infarcts. 2D echo showed EF estimated at 66 5%, mild concentric LVH, grade 1 diastolic dysfunction. MRI head and neck did not show any large vessel occlusion PT recommended outpatient PT. Case discussed with Dr. Daril Edge, neurologist.  She recommended dual antiplatelet therapy with aspirin  and Plavix  for total of 21 days followed by Plavix  monotherapy Continue Lipitor.     ESRD: Follow up with nephrologist for  outpatient hemodialysis     Hypertension: Resume antihypertensives at discharge     Type II DM, insulin -dependent: Continue Lantus and NovoLog . Hemoglobin A1c 6.1.     Hyperlipidemia: Total cholesterol 135, triglycerides 140, HDL 34, LDL 73 Continue Lipitor     Comorbidities include anemia of chronic disease, legal blindness, history of asthma    His condition has improved and he is deemed stable for discharge to home today.       Consultants: Nephrologist, neurologist Procedures performed: None Disposition: Home Diet recommendation:  Discharge Diet Orders (From admission, onward)     Start     Ordered   09/14/23 0000  Diet - low sodium heart healthy        09/14/23 1227           Renal diet, diabetic diet DISCHARGE MEDICATION: Allergies as of 09/14/2023       Reactions   Pine Hives, Itching        Medication List     STOP taking these medications    carvedilol  12.5 MG tablet Commonly known as: COREG    furosemide  40 MG tablet Commonly known as: LASIX    insulin  detemir 100 UNIT/ML injection Commonly known as: LEVEMIR    Levemir  FlexPen 100 UNIT/ML FlexPen Generic drug: insulin  detemir   losartan  100 MG tablet Commonly known as: COZAAR    metoprolol  tartrate 100 MG tablet Commonly known as: LOPRESSOR    midodrine  5 MG tablet Commonly known as: PROAMATINE    mirtazapine  15 MG tablet Commonly known as: REMERON    prednisoLONE acetate 1 % ophthalmic suspension Commonly known as: PRED FORTE   sevelamer carbonate 800 MG  tablet Commonly known as: RENVELA       TAKE these medications    albuterol 108 (90 Base) MCG/ACT inhaler Commonly known as: VENTOLIN HFA Inhale 2 puffs into the lungs every 6 (six) hours as needed.   aspirin  EC 81 MG tablet Take 1 tablet (81 mg total) by mouth daily for 20 days. Swallow whole.   atorvastatin  40 MG tablet Commonly known as: LIPITOR Take 40 mg by mouth daily.   Auryxia 1 GM 210 MG(Fe) tablet Generic  drug: ferric citrate Take 210 mg by mouth 3 (three) times daily with meals.   calcitRIOL  0.5 MCG capsule Commonly known as: ROCALTROL  Take 0.5 mcg by mouth daily.   cloNIDine  0.3 MG tablet Commonly known as: CATAPRES  Take 1 tablet (0.3 mg total) by mouth 2 (two) times daily.   clopidogrel  75 MG tablet Commonly known as: PLAVIX  Take 1 tablet (75 mg total) by mouth daily. Start taking on: Sep 15, 2023   glipiZIDE 5 MG 24 hr tablet Commonly known as: GLUCOTROL XL Take 5 mg by mouth daily.   Lantus SoloStar 100 UNIT/ML Solostar Pen Generic drug: insulin  glargine Inject 20 Units into the skin at bedtime.   lidocaine -prilocaine  cream Commonly known as: EMLA  Apply 1 Application topically daily.   lisinopril  20 MG tablet Commonly known as: ZESTRIL  Take 20 mg by mouth daily.   metolazone  5 MG tablet Commonly known as: ZAROXOLYN  Patient states he takes it twice a week What changed:  how much to take how to take this when to take this additional instructions   metoprolol  succinate 100 MG 24 hr tablet Commonly known as: TOPROL -XL Take 100 mg by mouth daily.   Rena-Vite Rx 1 MG Tabs Take 1 tablet by mouth daily. What changed: Another medication with the same name was removed. Continue taking this medication, and follow the directions you see here.   sertraline 50 MG tablet Commonly known as: ZOLOFT Take 50 mg by mouth daily.   ursodiol  300 MG capsule Commonly known as: ACTIGALL  Take 300 mg by mouth 3 (three) times daily.        Discharge Exam: Filed Weights   09/12/23 0847 09/13/23 1450 09/13/23 1817  Weight: 93 kg 89.5 kg 89.5 kg   GEN: NAD SKIN: Warm and dry EYES: No pallor or icterus ENT: MMM CV: RRR PULM: CTA B ABD: soft, ND, NT, +BS CNS: AAO x 3, non focal EXT: No edema or tenderness   Condition at discharge: good  The results of significant diagnostics from this hospitalization (including imaging, microbiology, ancillary and laboratory) are  listed below for reference.   Imaging Studies: MR ANGIO HEAD WO CONTRAST Result Date: 09/14/2023 CLINICAL DATA:  Neuro deficit, acute, stroke suspected EXAM: MRA NECK WITHOUT CONTRAST MRA HEAD WITHOUT CONTRAST TECHNIQUE: Angiographic images of the Circle of Willis were acquired using MRA technique without intravenous contrast. COMPARISON:  None Available. FINDINGS: MRA NECK FINDINGS Aortic arch: Great vessel origins are patent. Right carotid system: Patent without greater than 50% stenosis. Left carotid system: Patent without greater than 50% stenosis. Vertebral arteries: Both vertebral arteries are patent without evidence of greater than 50% stenosis. MRA HEAD FINDINGS Anterior circulation: Bilateral intracranial ICAs, MCAs, and ACAs are patent without proximal hemodynamically significant stenosis. No aneurysm identified. Posterior circulation: Bilateral intradural vertebral arteries, basilar artery and bilateral posterior cerebral arteries are patent without proximal hemodynamically significant stenosis. Fetal type PCAs, anatomic variant. IMPRESSION: No large vessel occlusion or proximal hemodynamically significant stenosis. Electronically Signed   By: Benjiman Bras  Rochelle Chu M.D.   On: 09/14/2023 00:23   MR ANGIO NECK WO CONTRAST Result Date: 09/14/2023 CLINICAL DATA:  Neuro deficit, acute, stroke suspected EXAM: MRA NECK WITHOUT CONTRAST MRA HEAD WITHOUT CONTRAST TECHNIQUE: Angiographic images of the Circle of Willis were acquired using MRA technique without intravenous contrast. COMPARISON:  None Available. FINDINGS: MRA NECK FINDINGS Aortic arch: Great vessel origins are patent. Right carotid system: Patent without greater than 50% stenosis. Left carotid system: Patent without greater than 50% stenosis. Vertebral arteries: Both vertebral arteries are patent without evidence of greater than 50% stenosis. MRA HEAD FINDINGS Anterior circulation: Bilateral intracranial ICAs, MCAs, and ACAs are patent without  proximal hemodynamically significant stenosis. No aneurysm identified. Posterior circulation: Bilateral intradural vertebral arteries, basilar artery and bilateral posterior cerebral arteries are patent without proximal hemodynamically significant stenosis. Fetal type PCAs, anatomic variant. IMPRESSION: No large vessel occlusion or proximal hemodynamically significant stenosis. Electronically Signed   By: Stevenson Elbe M.D.   On: 09/14/2023 00:23   ECHOCARDIOGRAM COMPLETE Result Date: 09/13/2023    ECHOCARDIOGRAM REPORT   Patient Name:   Bradley Lopez Date of Exam: 09/13/2023 Medical Rec #:  [id number]     Height:       75.0 in Accession #:    [id number]    Weight:       205.0 lb Date of Birth:  08/12/1975     BSA:          2.218 m Patient Age:    47 years      BP:           183/97 mmHg Patient Gender: M             HR:           93 bpm. Exam Location:  [facility] Procedure: 2D Echo, Cardiac Doppler and Color Doppler (Both Spectral and Color            Flow Doppler were utilized during procedure). Indications:     TIA G45.9  History:         Patient has prior history of Echocardiogram examinations. Risk                  Factors:Hypertension.  Sonographer:     Kathaleen Pale Roar Referring Phys:  [id number] Wiliam Harder A THOMAS Diagnosing Phys: Antonette Batters MD IMPRESSIONS  1. Left ventricular ejection fraction, by estimation, is 60 to 65%. The left ventricle has normal function. The left ventricle has no regional wall motion abnormalities. There is mild concentric left ventricular hypertrophy. Left ventricular diastolic parameters are consistent with Grade I diastolic dysfunction (impaired relaxation).  2. Right ventricular systolic function is normal. The right ventricular size is normal.  3. The mitral valve is grossly normal. Trivial mitral valve regurgitation.  4. The aortic valve is normal in structure. Aortic valve regurgitation is not visualized. Aortic valve sclerosis/calcification is present, without any evidence of  aortic stenosis. FINDINGS  Left Ventricle: Left ventricular ejection fraction, by estimation, is 60 to 65%. The left ventricle has normal function. The left ventricle has no regional wall motion abnormalities. Strain was performed and the global longitudinal strain is indeterminate. Global longitudinal strain performed but not reported based on interpreter judgement due to suboptimal tracking. The left ventricular internal cavity size was normal in size. There is mild concentric left ventricular hypertrophy. Left ventricular diastolic parameters are consistent with Grade I diastolic dysfunction (impaired relaxation). Right Ventricle: The right ventricular size is normal. No increase in right ventricular wall thickness.  Right ventricular systolic function is normal. Left Atrium: Left atrial size was normal in size. Right Atrium: Right atrial size was normal in size. Pericardium: There is no evidence of pericardial effusion. Mitral Valve: The mitral valve is grossly normal. There is mild thickening of the mitral valve leaflet(s). There is mild calcification of the mitral valve leaflet(s). Normal mobility of the mitral valve leaflets. Trivial mitral valve regurgitation. MV peak gradient, 9.1 mmHg. The mean mitral valve gradient is 4.0 mmHg. Tricuspid Valve: The tricuspid valve is normal in structure. Tricuspid valve regurgitation is mild. Aortic Valve: The aortic valve is normal in structure. Aortic valve regurgitation is not visualized. Aortic valve sclerosis/calcification is present, without any evidence of aortic stenosis. Aortic valve mean gradient measures 5.0 mmHg. Aortic valve peak  gradient measures 10.1 mmHg. Aortic valve area, by VTI measures 3.03 cm. Pulmonic Valve: The pulmonic valve was normal in structure. Pulmonic valve regurgitation is not visualized. Aorta: The ascending aorta was not well visualized. IAS/Shunts: No atrial level shunt detected by color flow Doppler. Additional Comments: 3D was  performed not requiring image post processing on an independent workstation and was indeterminate.  LEFT VENTRICLE PLAX 2D LVIDd:         4.10 cm   Diastology LVIDs:         2.80 cm   LV e' medial:    7.62 cm/s LV PW:         1.30 cm   LV E/e' medial:  12.7 LV IVS:        1.40 cm   LV e' lateral:   10.80 cm/s LVOT diam:     2.00 cm   LV E/e' lateral: 9.0 LV SV:         80 LV SV Index:   36 LVOT Area:     3.14 cm  RIGHT VENTRICLE RV Basal diam:  3.30 cm RV Mid diam:    2.80 cm RV S prime:     14.10 cm/s TAPSE (M-mode): 3.3 cm LEFT ATRIUM             Index        RIGHT ATRIUM           Index LA diam:        3.80 cm 1.71 cm/m   RA Area:     17.70 cm LA Vol (A2C):   61.7 ml 27.82 ml/m  RA Volume:   47.40 ml  21.38 ml/m LA Vol (A4C):   60.1 ml 27.10 ml/m LA Biplane Vol: 63.0 ml 28.41 ml/m  AORTIC VALVE                     PULMONIC VALVE AV Area (Vmax):    2.29 cm      PV Vmax:        1.35 m/s AV Area (Vmean):   2.69 cm      PV Peak grad:   7.3 mmHg AV Area (VTI):     3.03 cm      RVOT Peak grad: 5 mmHg AV Vmax:           159.00 cm/s AV Vmean:          106.000 cm/s AV VTI:            0.263 m AV Peak Grad:      10.1 mmHg AV Mean Grad:      5.0 mmHg LVOT Vmax:         116.00 cm/s LVOT Vmean:  90.600 cm/s LVOT VTI:          0.254 m LVOT/AV VTI ratio: 0.97  AORTA Ao Root diam: 2.60 cm Ao Asc diam:  3.00 cm MITRAL VALVE MV Area (PHT): 7.16 cm     SHUNTS MV Area VTI:   3.32 cm     Systemic VTI:  0.25 m MV Peak grad:  9.1 mmHg     Systemic Diam: 2.00 cm MV Mean grad:  4.0 mmHg MV Vmax:       1.51 m/s MV Vmean:      90.4 cm/s MV Decel Time: 106 msec MV E velocity: 96.90 cm/s MV A velocity: 118.00 cm/s MV E/A ratio:  0.82 MV A Prime:    15.6 cm/s Antonette Batters MD Electronically signed by Antonette Batters MD Signature Date/Time: 09/13/2023/2:44:32 PM    Final    MR BRAIN WO CONTRAST Result Date: 09/12/2023 CLINICAL DATA:  Provided history: Neuro deficit, acute, stroke suspected. EXAM: MRI HEAD WITHOUT  CONTRAST TECHNIQUE: Multiplanar, multiecho pulse sequences of the brain and surrounding structures were obtained without intravenous contrast. COMPARISON:  Head CT 09/12/2023. MRI brain and MRA head 01/05/2021. FINDINGS: Brain: No age-advanced or lobar predominant cerebral atrophy. 4 mm acute infarct within the left thalamus. Chronic lacunar infarcts within the posterior limb of right internal capsule and right thalamus, new from the prior MRI of 01/05/2021. Several nonspecific punctate chronic microhemorrhages scattered within the supratentorial brain, new from the prior MRI. Tiny chronic infarct within the left cerebellar hemisphere, new from the prior MRI (series 10, image 6). No evidence of an intracranial mass. No extra-axial fluid collection. No midline shift. Vascular: Maintained flow voids within the proximal large arterial vessels. Skull and upper cervical spine: No focal worrisome marrow lesion. Sinuses/Orbits: No mass or acute finding within the imaged orbits. Prior bilateral ocular lens replacement. No significant paranasal sinus disease. IMPRESSION: 1. 4 mm acute infarct within the left thalamus. 2. Chronic lacunar infarcts within the posterior limb of right internal capsule and right thalamus, new from the prior MRI of 01/05/2021. 3. Several nonspecific punctate chronic microhemorrhages within the supratentorial brain, new from the prior MRI. 4. Tiny chronic infarct within the left cerebellar hemisphere, also new from the prior MRI. Electronically Signed   By: Bascom Lily D.O.   On: 09/12/2023 16:41   CT HEAD WO CONTRAST Result Date: 09/12/2023 CLINICAL DATA:  Provided history: Neuro deficit, acute, stroke suspected. Right-sided numbness. Weakness. EXAM: CT HEAD WITHOUT CONTRAST TECHNIQUE: Contiguous axial images were obtained from the base of the skull through the vertex without intravenous contrast. RADIATION DOSE REDUCTION: This exam was performed according to the departmental dose-optimization  program which includes automated exposure control, adjustment of the mA and/or kV according to patient size and/or use of iterative reconstruction technique. COMPARISON:  Brain MRI 01/05/2021. MRA head 01/05/2021. FINDINGS: Brain: No age-advanced or lobar predominant cerebral atrophy. There is no acute intracranial hemorrhage. No demarcated cortical infarct. No extra-axial fluid collection. No evidence of an intracranial mass. No midline shift. Vascular: No hyperdense vessel.  Atherosclerotic calcifications. Skull: No calvarial fracture or aggressive osseous lesion. Sinuses/Orbits: No mass or acute finding within the imaged orbits. Partial opacification of a posterior left ethmoid air cell. IMPRESSION: 1. No evidence of an acute intracranial abnormality. 2. Mild left ethmoid sinus disease. Electronically Signed   By: Bascom Lily D.O.   On: 09/12/2023 09:59   CT CORONARY MORPH W/CTA COR W/SCORE W/CA W/CM &/OR WO/CM Result Date: 09/05/2023 CLINICAL DATA:  Chest  pain EXAM: Cardiac/Coronary  CTA TECHNIQUE: The patient was scanned on a Siemens Somatom scanner. : A retrospective scan was triggered in the ascending thoracic aorta. Axial non-contrast 3 mm slices were carried out through the heart. The data set was analyzed on a dedicated work station and scored using the Agatson method. Gantry rotation speed was 66 msecs and collimation was .6 mm. 100mg  of metoprolol  and 0.8 mg of sl NTG was given. The 3D data set was reconstructed in 5% intervals of the 60-95 % of the R-R cycle. Diastolic phases were analyzed on a dedicated work station using MPR, MIP and VRT modes. The patient received 80 cc of contrast. FINDINGS: Aorta:  Normal size.  Aortic wall calcifications.  No dissection. Aortic Valve:  Trileaflet.  No calcifications. Coronary Arteries:  Normal coronary origin.  Right dominance. RCA is a dominant artery. There is calcified plaque distally causing minimal stenosis (<25%). Left main gives rise to LAD and LCX  arteries. LM has no disease. LAD has calcified plaque in the mid segment causing minimal stenosis (<25%). LCX is a non-dominant artery.  There is no plaque. Other findings: Normal pulmonary vein drainage into the left atrium. Normal left atrial appendage without a thrombus. Normal size of the pulmonary artery. IMPRESSION: 1. Coronary calcium  score of 10.4. This was 80th percentile for age and sex matched control. 2. Normal coronary origin with right dominance. 3. Minimal LAD and RCA stenosis (<25%). 4. CAD-RADS 1. Minimal non-obstructive CAD (0-24%). Consider preventive therapy and risk factor modification. Electronically Signed   By: Constancia Delton M.D.   On: 09/05/2023 13:12    Microbiology: Results for orders placed or performed during the hospital encounter of 09/12/23  MRSA Next Gen by PCR, Nasal     Status: None   Collection Time: 09/12/23 11:10 PM   Specimen: Nasal Mucosa; Nasal Swab  Result Value Ref Range Status   MRSA by PCR Next Gen NOT DETECTED NOT DETECTED Final    Comment: (NOTE) The GeneXpert MRSA Assay (FDA approved for NASAL specimens only), is one component of a comprehensive MRSA colonization surveillance program. It is not intended to diagnose MRSA infection nor to guide or monitor treatment for MRSA infections. Test performance is not FDA approved in patients less than 64 years old. Performed at [facility], [address] [place], [place] [address]     Labs: CBC: Recent Labs  Lab 09/12/23 0848  WBC 6.6  NEUTROABS 3.6  HGB 12.4*  HCT 38.2*  MCV 93.9  PLT 240   Basic Metabolic Panel: Recent Labs  Lab 09/12/23 0848  NA 141  K 4.3  CL 98  CO2 30  GLUCOSE 123*  BUN 33*  CREATININE 8.56*  CALCIUM  9.0   Liver Function Tests: Recent Labs  Lab 09/12/23 0848  AST 15  ALT 15  ALKPHOS 73  BILITOT 0.8  PROT 8.3*  ALBUMIN 4.5   CBG: Recent Labs  Lab 09/13/23 1142 09/13/23 1920 09/13/23 2124 09/14/23 0725 09/14/23 1118  GLUCAP  183* 75 162* 99 155*    Discharge time spent: greater than 30 minutes.  Signed: Sheril Dines, MD [facility] 09/14/2023

## 2023-09-14 NOTE — TOC CM/SW Note (Signed)
 Transition of Care [facility]) - Inpatient Brief Assessment   Patient Details  Name: Bradley Lopez MRN: [id number] Date of Birth: 08-18-1975  [facility] of [facility] CM/SW Contact:    Holland Lundborg, RN Phone Number: 09/14/2023, 1:19 PM   Clinical Narrative:  Patient with discharge orders, aware of recommendation for outpatient PT.  Prefer to use [facility], he will have PCP place referral.  Family will provide transportation home.  No further TOC needs.   Transition of Care Asessment: Insurance and Status: Insurance coverage has been reviewed Patient has primary care physician: Yes Home environment has been reviewed: with wife Prior level of function:: Independent Prior/Current Home Services: No current home services Social Drivers of Health Review: SDOH reviewed no interventions necessary Readmission risk has been reviewed: Yes Transition of care needs: no transition of care needs at this time

## 2023-12-21 NOTE — Telephone Encounter (Signed)
 Dr Douglass  requested patient be screened for organ offer (KI PA or KP) UNOS# [id number].  Pt would be (primary/backup) Primary  Virtual cross match Kayn Haymore ([id number]) is expected to be Negative for T-cells and B-cells. *This patient is current at 11-22-23. per Camellia Netherton  HLA. Pt contacted and donor information reviewed to include age 48 KDPI 9%.Pt accepted offer and agrees to be on standby at home.  Pt instructed to be NPO after midnight . Pt informed if the surgeon accepts the kidney, surgery would likely be TBD.   Pt understands they will be contacted when more information is available.    HEALTH SCREENING:   Covid exposure or symptoms? No Covid vaccination status: not vaccinated  Blood thinners: Plavix  Dialysis center:   [facility]   Dialysis Schedule and Type: MWF, HD Last dry weight: 90.9kg Last dialysis: 12/20/2023 Nephrologist: Dr. Bonnell Sherry, MD,[phone] No recent hospitalizations, blood transfusions.  No antibiotics in last 3 months Yes  medication changes in the last 6 months.  No open wounds or signs of infections.  No dental problems.  No changes in insurance. Primary care giver(s):  Covell,Cheryl  NPO: after midnight  Dr. Douglass updated.    Per Dr Douglass have patient Hold tomorrow mornings dose of plavix  and glipizide  . Patient verbalized understanding.

## 2023-12-22 NOTE — Telephone Encounter (Signed)
 KP offer declined. Patient released from standby.
